# Patient Record
Sex: Male | Born: 1940 | Race: Asian | Hispanic: No | Marital: Married | Smoking: Former smoker
Health system: Southern US, Community
[De-identification: ages and names within clinical notes are randomized; demographics above are authoritative.]

## PROBLEM LIST (undated history)

## (undated) DIAGNOSIS — N400 Enlarged prostate without lower urinary tract symptoms: Secondary | ICD-10-CM

## (undated) DIAGNOSIS — J189 Pneumonia, unspecified organism: Secondary | ICD-10-CM

## (undated) DIAGNOSIS — I714 Abdominal aortic aneurysm, without rupture, unspecified: Secondary | ICD-10-CM

## (undated) DIAGNOSIS — R911 Solitary pulmonary nodule: Principal | ICD-10-CM

## (undated) DIAGNOSIS — R351 Nocturia: Secondary | ICD-10-CM

## (undated) DIAGNOSIS — M069 Rheumatoid arthritis, unspecified: Secondary | ICD-10-CM

## (undated) DIAGNOSIS — M2550 Pain in unspecified joint: Secondary | ICD-10-CM

## (undated) DIAGNOSIS — R350 Frequency of micturition: Secondary | ICD-10-CM

## (undated) DIAGNOSIS — R3915 Urgency of urination: Secondary | ICD-10-CM

## (undated) DIAGNOSIS — G4700 Insomnia, unspecified: Secondary | ICD-10-CM

## (undated) DIAGNOSIS — I10 Essential (primary) hypertension: Secondary | ICD-10-CM

## (undated) DIAGNOSIS — M2540 Effusion, unspecified joint: Secondary | ICD-10-CM

## (undated) HISTORY — DX: Essential (primary) hypertension: I10

## (undated) HISTORY — PX: APPENDECTOMY: SHX54

## (undated) HISTORY — DX: Rheumatoid arthritis, unspecified: M06.9

## (undated) HISTORY — DX: Solitary pulmonary nodule: R91.1

---

## 1999-12-16 ENCOUNTER — Encounter: Admission: RE | Admit: 1999-12-16 | Discharge: 1999-12-16 | Payer: Self-pay | Admitting: *Deleted

## 1999-12-16 ENCOUNTER — Encounter: Payer: Self-pay | Admitting: *Deleted

## 1999-12-24 ENCOUNTER — Ambulatory Visit (HOSPITAL_COMMUNITY): Admission: RE | Admit: 1999-12-24 | Discharge: 1999-12-24 | Payer: Self-pay | Admitting: *Deleted

## 1999-12-24 ENCOUNTER — Encounter (INDEPENDENT_AMBULATORY_CARE_PROVIDER_SITE_OTHER): Payer: Self-pay | Admitting: *Deleted

## 2002-05-17 ENCOUNTER — Encounter: Admission: RE | Admit: 2002-05-17 | Discharge: 2002-08-15 | Payer: Self-pay | Admitting: Rheumatology

## 2002-10-05 ENCOUNTER — Encounter: Payer: Self-pay | Admitting: Rheumatology

## 2002-10-05 ENCOUNTER — Encounter: Admission: RE | Admit: 2002-10-05 | Discharge: 2002-10-05 | Payer: Self-pay | Admitting: Rheumatology

## 2003-11-01 ENCOUNTER — Encounter: Admission: RE | Admit: 2003-11-01 | Discharge: 2003-11-01 | Payer: Self-pay | Admitting: Rheumatology

## 2005-02-04 ENCOUNTER — Ambulatory Visit: Payer: Self-pay | Admitting: Internal Medicine

## 2005-03-02 ENCOUNTER — Ambulatory Visit: Payer: Self-pay | Admitting: Internal Medicine

## 2005-11-25 ENCOUNTER — Ambulatory Visit: Payer: Self-pay | Admitting: Internal Medicine

## 2006-02-28 HISTORY — PX: INGUINAL HERNIA REPAIR: SHX194

## 2006-12-30 ENCOUNTER — Encounter: Payer: Self-pay | Admitting: *Deleted

## 2006-12-30 DIAGNOSIS — Z9889 Other specified postprocedural states: Secondary | ICD-10-CM | POA: Insufficient documentation

## 2006-12-30 DIAGNOSIS — M949 Disorder of cartilage, unspecified: Secondary | ICD-10-CM

## 2006-12-30 DIAGNOSIS — Z9089 Acquired absence of other organs: Secondary | ICD-10-CM

## 2006-12-30 DIAGNOSIS — M069 Rheumatoid arthritis, unspecified: Secondary | ICD-10-CM

## 2006-12-30 DIAGNOSIS — M899 Disorder of bone, unspecified: Secondary | ICD-10-CM | POA: Insufficient documentation

## 2007-01-22 ENCOUNTER — Encounter: Payer: Self-pay | Admitting: Internal Medicine

## 2007-05-01 ENCOUNTER — Encounter: Payer: Self-pay | Admitting: Internal Medicine

## 2007-05-03 ENCOUNTER — Ambulatory Visit: Payer: Self-pay | Admitting: Internal Medicine

## 2007-05-03 ENCOUNTER — Encounter: Payer: Self-pay | Admitting: Internal Medicine

## 2007-05-03 DIAGNOSIS — F17200 Nicotine dependence, unspecified, uncomplicated: Secondary | ICD-10-CM

## 2007-05-03 DIAGNOSIS — J189 Pneumonia, unspecified organism: Secondary | ICD-10-CM

## 2007-05-03 LAB — CONVERTED CEMR LAB
ALT: 20 units/L (ref 0–53)
AST: 22 units/L (ref 0–37)
Albumin: 4.3 g/dL (ref 3.5–5.2)
Alkaline Phosphatase: 54 units/L (ref 39–117)
BUN: 13 mg/dL (ref 6–23)
Basophils Absolute: 0.1 10*3/uL (ref 0.0–0.1)
Basophils Relative: 0.7 % (ref 0.0–1.0)
Bilirubin, Direct: 0.1 mg/dL (ref 0.0–0.3)
CO2: 32 meq/L (ref 19–32)
Calcium: 9.9 mg/dL (ref 8.4–10.5)
Chloride: 100 meq/L (ref 96–112)
Cholesterol: 188 mg/dL (ref 0–200)
Creatinine, Ser: 0.6 mg/dL (ref 0.4–1.5)
Direct LDL: 114.3 mg/dL
Eosinophils Absolute: 0.2 10*3/uL (ref 0.0–0.6)
Eosinophils Relative: 1.8 % (ref 0.0–5.0)
GFR calc Af Amer: 174 mL/min
GFR calc non Af Amer: 144 mL/min
Glucose, Bld: 95 mg/dL (ref 70–99)
HCT: 41.6 % (ref 39.0–52.0)
HDL: 30.2 mg/dL — ABNORMAL LOW (ref >39.0)
Hemoglobin: 14.2 g/dL (ref 13.0–17.0)
Lymphocytes Relative: 19.7 % (ref 12.0–46.0)
MCHC: 34.1 g/dL (ref 30.0–36.0)
MCV: 99 fL (ref 78.0–100.0)
Monocytes Absolute: 0.7 10*3/uL (ref 0.2–0.7)
Monocytes Relative: 8.3 % (ref 3.0–11.0)
Neutro Abs: 6.1 10*3/uL (ref 1.4–7.7)
Neutrophils Relative %: 69.5 % (ref 43.0–77.0)
PSA: 0.95 ng/mL (ref 0.10–4.00)
Platelets: 214 10*3/uL (ref 150–400)
Potassium: 4.3 meq/L (ref 3.5–5.1)
RBC: 4.2 M/uL — ABNORMAL LOW (ref 4.22–5.81)
RDW: 13 % (ref 11.5–14.6)
Sodium: 138 meq/L (ref 135–145)
TSH: 1.34 microintl units/mL (ref 0.35–5.50)
Total Bilirubin: 0.8 mg/dL (ref 0.3–1.2)
Total CHOL/HDL Ratio: 6.2
Total Protein: 6.8 g/dL (ref 6.0–8.3)
Triglycerides: 254 mg/dL (ref 0–149)
VLDL: 51 mg/dL — ABNORMAL HIGH (ref 0–40)
WBC: 8.9 10*3/uL (ref 4.5–10.5)

## 2007-05-06 ENCOUNTER — Encounter: Payer: Self-pay | Admitting: Internal Medicine

## 2007-08-09 ENCOUNTER — Encounter: Payer: Self-pay | Admitting: Internal Medicine

## 2007-11-08 ENCOUNTER — Encounter: Payer: Self-pay | Admitting: Internal Medicine

## 2008-01-03 ENCOUNTER — Telehealth: Payer: Self-pay | Admitting: Internal Medicine

## 2008-02-18 ENCOUNTER — Encounter: Payer: Self-pay | Admitting: Internal Medicine

## 2008-05-13 ENCOUNTER — Encounter: Payer: Self-pay | Admitting: Internal Medicine

## 2008-07-17 ENCOUNTER — Telehealth: Payer: Self-pay | Admitting: Internal Medicine

## 2008-07-31 ENCOUNTER — Ambulatory Visit: Payer: Self-pay | Admitting: Internal Medicine

## 2008-07-31 DIAGNOSIS — M5430 Sciatica, unspecified side: Secondary | ICD-10-CM | POA: Insufficient documentation

## 2008-07-31 LAB — CONVERTED CEMR LAB
BUN: 13 mg/dL (ref 6–23)
CO2: 29 meq/L (ref 19–32)
Calcium: 9.3 mg/dL (ref 8.4–10.5)
Chloride: 106 meq/L (ref 96–112)
Cholesterol: 180 mg/dL (ref 0–200)
Creatinine, Ser: 0.9 mg/dL (ref 0.4–1.5)
GFR calc non Af Amer: 89.74 mL/min (ref >60)
Glucose, Bld: 87 mg/dL (ref 70–99)
HDL: 34 mg/dL — ABNORMAL LOW (ref >39.00)
LDL Cholesterol: 118 mg/dL — ABNORMAL HIGH (ref 0–99)
PSA: 0.72 ng/mL (ref 0.10–4.00)
Potassium: 4 meq/L (ref 3.5–5.1)
Sodium: 141 meq/L (ref 135–145)
Total CHOL/HDL Ratio: 5
Triglycerides: 139 mg/dL (ref 0.0–149.0)
VLDL: 27.8 mg/dL (ref 0.0–40.0)

## 2008-11-11 ENCOUNTER — Encounter: Payer: Self-pay | Admitting: Internal Medicine

## 2009-01-20 ENCOUNTER — Encounter: Payer: Self-pay | Admitting: Internal Medicine

## 2009-02-11 ENCOUNTER — Ambulatory Visit: Payer: Self-pay | Admitting: Internal Medicine

## 2009-02-11 DIAGNOSIS — R7989 Other specified abnormal findings of blood chemistry: Secondary | ICD-10-CM | POA: Insufficient documentation

## 2009-02-12 ENCOUNTER — Encounter: Payer: Self-pay | Admitting: Internal Medicine

## 2009-02-13 ENCOUNTER — Telehealth (INDEPENDENT_AMBULATORY_CARE_PROVIDER_SITE_OTHER): Payer: Self-pay | Admitting: *Deleted

## 2009-02-19 ENCOUNTER — Ambulatory Visit: Payer: Self-pay | Admitting: Internal Medicine

## 2009-04-01 ENCOUNTER — Encounter: Payer: Self-pay | Admitting: Internal Medicine

## 2009-06-29 ENCOUNTER — Encounter: Payer: Self-pay | Admitting: Internal Medicine

## 2009-09-28 ENCOUNTER — Encounter: Payer: Self-pay | Admitting: Internal Medicine

## 2009-09-30 ENCOUNTER — Telehealth: Payer: Self-pay | Admitting: Internal Medicine

## 2009-10-05 ENCOUNTER — Telehealth: Payer: Self-pay | Admitting: Internal Medicine

## 2009-10-06 ENCOUNTER — Ambulatory Visit: Payer: Self-pay | Admitting: Cardiology

## 2009-10-10 ENCOUNTER — Telehealth: Payer: Self-pay | Admitting: Internal Medicine

## 2009-10-22 ENCOUNTER — Encounter: Payer: Self-pay | Admitting: Internal Medicine

## 2009-10-22 ENCOUNTER — Telehealth: Payer: Self-pay | Admitting: Internal Medicine

## 2009-12-30 ENCOUNTER — Encounter: Payer: Self-pay | Admitting: Internal Medicine

## 2010-02-04 ENCOUNTER — Ambulatory Visit: Payer: Self-pay | Admitting: Internal Medicine

## 2010-02-09 ENCOUNTER — Ambulatory Visit: Payer: Self-pay | Admitting: Internal Medicine

## 2010-02-09 ENCOUNTER — Encounter: Payer: Self-pay | Admitting: Internal Medicine

## 2010-02-09 DIAGNOSIS — N529 Male erectile dysfunction, unspecified: Secondary | ICD-10-CM

## 2010-02-09 DIAGNOSIS — N400 Enlarged prostate without lower urinary tract symptoms: Secondary | ICD-10-CM

## 2010-02-09 LAB — CONVERTED CEMR LAB
CO2: 28 meq/L (ref 19–32)
Calcium: 9.4 mg/dL (ref 8.4–10.5)
Chloride: 103 meq/L (ref 96–112)
Sodium: 139 meq/L (ref 135–145)
Testosterone: 309.04 ng/dL — ABNORMAL LOW (ref 350.00–890.00)

## 2010-02-12 ENCOUNTER — Telehealth: Payer: Self-pay | Admitting: Internal Medicine

## 2010-02-15 ENCOUNTER — Telehealth: Payer: Self-pay | Admitting: Internal Medicine

## 2010-02-16 ENCOUNTER — Ambulatory Visit: Payer: Self-pay | Admitting: Internal Medicine

## 2010-02-28 DIAGNOSIS — R911 Solitary pulmonary nodule: Secondary | ICD-10-CM

## 2010-02-28 HISTORY — DX: Solitary pulmonary nodule: R91.1

## 2010-03-28 LAB — CONVERTED CEMR LAB
Hep B C IgM: NEGATIVE
Hep B Core Total Ab: POSITIVE — AB
Hep B S Ab: NEGATIVE
Hepatitis B Surface Ag: NEGATIVE

## 2010-03-30 NOTE — Letter (Signed)
Summary: [facility]   Imported By: Sherian Rein 01/05/2010 10:57:30  _____________________________________________________________________  External Attachment:    Type:   Image     Comment:   External Document

## 2010-03-30 NOTE — Letter (Signed)
Summary: [facility]   Imported By: Sherian Rein 07/08/2009 08:54:40  _____________________________________________________________________  External Attachment:    Type:   Image     Comment:   External Document

## 2010-03-30 NOTE — Letter (Signed)
Summary: [facility] [place]   Imported By: Lester [place] 10/05/2009 07:26:53  _____________________________________________________________________  External Attachment:    Type:   Image     Comment:   External Document

## 2010-03-30 NOTE — Letter (Signed)
Summary: [facility]  [facility]   Imported By: Lennie Odor 10/02/2009 17:15:54  _____________________________________________________________________  External Attachment:    Type:   Image     Comment:   External Document

## 2010-03-30 NOTE — Letter (Signed)
Summary: [facility]   Imported By: Sherian Rein 04/06/2009 10:13:23  _____________________________________________________________________  External Attachment:    Type:   Image     Comment:   External Document

## 2010-03-30 NOTE — Letter (Signed)
   [facility] [address] [place], [place]  [address] Phone: [phone]      October 22, 2009   [facility] [address] [place], [place] [address]  RE:  LAB RESULTS  Dear  Mr. Burridge,  The following is an interpretation of your most recent lab tests.  Please take note of any instructions provided or changes to medications that have resulted from your lab work.    CT angiogram of the chest reveals that there is a crooked ascending aorta but not an aneurysm.   Sincerely Yours,    Jacques Navy MD  T ANGIO CHEST W/CM &/OR WO/CM - [id number]   Clinical Data:  Weight loss, ascending aortic aneurysm   CT ANGIOGRAPHY CHEST WITH CONTRAST   Technique:  Multidetector CT imaging of the chest was performed using the standard protocol during bolus administration of intravenous contrast.  Multiplanar CT image reconstructions including MIPs were obtained to evaluate the vascular anatomy.   Contrast:  100 ml Omnipaque-300 IV   Comparison:  None.   Findings:  Aortic root 4 cm maximal transverse diameter, distal ascending aorta 4 cm, proximal arch 3.6 cm, tapering to a diameter of 2.7 cm distally.  Proximal descending thoracic aorta is mildly ectatic measuring 3.7 cm, tapering to a diameter of 2.6 cm the diaphragm.   Patchy atheromatous calcifications are noted in coronary arteries and throughout the thoracic aorta.  No evidence of dissection or stenosis.  There is classic brachiocephalic arterial origin anatomy without proximal stenosis.  Visualized portions of the suprarenal abdominal aorta are atheromatous but otherwise unremarkable.   Pulmonary arterial circulation is unremarkable.   No pleural or pericardial effusion.  There are a few sub centimeter prevascular and AP window lymph nodes.  No hilar adenopathy. Visualized upper abdomen unremarkable.   There is linear scarring or atelectasis in the posterior basal segment left lower lobe.  Minimal  dependent atelectasis in the posterior right lower lobe.  Lungs otherwise clear.  There are mild emphysematous changes in the upper lobes.  Minimal spondylitic changes in the thoracic spine.   Review of the MIP images confirms the above findings.   IMPRESSION:   1.  4 cm ectatic ascending aorta.  Follow up recommended. 2.  Coronary and aortic calcifications. 3.  Mild emphysematous changes in the upper lobes.   Read By:  Deanne Coffer, D. Reuel Boom,  M.D.

## 2010-03-30 NOTE — Progress Notes (Signed)
Summary: PEER TO PEER - CT CHEST  ---- Converted from flag ---- ---- 10/05/2009 9:58 AM, Shelbie Proctor wrote: need peer to peer from doctor  pls call [phone] opt 2   referral for  CT chest with IV contrast to r/o ascending aortic aneurysm.  CXR ([facility]) with mild prominence of ascending aorta - receommended CT to r/o aneuryxm. Last BUN/Cr 14/0.67 September 28, 2009 case close end of the day   PT Plan [employer] member # [phone] ------------------------------  Phone Note From Other Clinic   Summary of Call: Today is the last day for the PEER TO PEER review, case will be closed tomorrow. Would you like [place] to get insurance on the phone for you?  Initial call taken by: Lamar Sprinkles, CMA,  October 05, 2009 10:41 AM  Follow-up for Phone Call        called and got authorization Follow-up by: Jacques Navy MD,  October 05, 2009 1:29 PM

## 2010-03-30 NOTE — Progress Notes (Signed)
Summary: REQ FOR RESULTS  Phone Note From Other Clinic   Summary of Call: Pt's daughter has called CT multiple times. Patient is requesting results of CT in a letter to be mailed to patients home.  Initial call taken by: Lamar Sprinkles, CMA,  October 22, 2009 2:42 PM  Follow-up for Phone Call        letter done and ready to mail in 3 minutes Follow-up by: Jacques Navy MD,  October 22, 2009 4:46 PM  Additional Follow-up for Phone Call Additional follow up Details #1::        letter was mailed to pt Additional Follow-up by: Ami Bullins CMA,  October 23, 2009 8:45 AM

## 2010-03-30 NOTE — Progress Notes (Signed)
Summary: REFILL - TEMAZEPAM  Phone Note Refill Request Message from:  Pharmacy  Refills Requested: Medication #1:  TEMAZEPAM 15 MG CAPS 1 at bedtime as needed Initial call taken by: Lamar Sprinkles, CMA,  October 10, 2009 9:25 AM  Follow-up for Phone Call        OK for refill x 5 Follow-up by: Jacques Navy MD,  October 10, 2009 3:04 PM    Prescriptions: TEMAZEPAM 15 MG CAPS (TEMAZEPAM) 1 at bedtime as needed  #30 x 5   Entered and Authorized by:   Ami Bullins CMA   Signed by:   Ami Bullins CMA on 10/12/2009   Method used:   Telephoned to ...       [facility] (retail)       [address] [place]       [address]       [place], [place]  [address]       Ph: [phone]       Fax: [phone]   RxID:   [id number]

## 2010-03-30 NOTE — Progress Notes (Signed)
  Phone Note From Other Clinic   Summary of Call: Patient's rheumatologist called and cxr showed possible aortic aneurysm. They are faxing over cxr report and office note today.  Initial call taken by: Lamar Sprinkles, CMA,  September 30, 2009 3:42 PM  Follow-up for Phone Call        read report: mild prominence of ascending aorta. Called patient to explain. Will schedule ct chest with contrast for further evaluation. [facility] notified Follow-up by: Jacques Navy MD,  September 30, 2009 5:24 PM  New Problems: ASCENDING AORTIC ANEURYSM (ICD-441.2)   New Problems: ASCENDING AORTIC ANEURYSM (ICD-441.2)

## 2010-04-01 ENCOUNTER — Encounter: Payer: Self-pay | Admitting: Internal Medicine

## 2010-04-01 NOTE — Progress Notes (Signed)
  Phone Note Outgoing Call   Reason for Call: Discuss lab or test results Summary of Call: please call patient - path report negative- no malignancy. Lesion is hyperpigmentation without sign of dysplasia or melanocyte displasia. Thanks Initial call taken by: Jacques Navy MD,  February 15, 2010 9:01 AM  Follow-up for Phone Call        informed pt and his wife Follow-up by: Ami Bullins CMA,  February 15, 2010 9:20 AM

## 2010-04-01 NOTE — Assessment & Plan Note (Signed)
Summary: last ov 12/10---cpx/cd   Vital Signs:  Patient profile:   70 year old male Height:      68 inches Weight:      143 pounds BMI:     21.82 O2 Sat:      97 % on Room air Temp:     98.0 degrees F oral Pulse rate:   70 / minute BP sitting:   138 / 88  (left arm) Cuff size:   regular  Vitals Entered By: Bill Salinas CMA (February 09, 2010 10:43 AM)  O2 Flow:  Room air CC: cpx/ ab  Vision Screening:      Vision Comments: Dec 2010 normal eye exam   Primary Care Provider:  Norins  CC:  cpx/ ab.  History of Present Illness: Patient presents for medical follow-up.   He is followed by Dr. Nickola Major for rheumatoid arthritis and closely monitors his labs related to his treatment. He has generally been feeling well except for fatigue.   He is concerned about prostate cancer since two colleagues have been recently diagnosed. His chart is reviewed and he had a normal PSA in '09 = 0.72 and has no symptoms except for nocturia. He had been on tamsulosin with good results but he believes that Dr. Nickola Major stopped this medication.   He has a new skin lesion on the back of this left leg which is dark in color.   He is independent in ADLs, continues to teach at [employer], has had no falls and is emotionally stable.   Preventive Screening-Counseling & Management  Alcohol-Tobacco     Alcohol drinks/day: 0     Smoking Status: quit     Year Quit: 2010  Caffeine-Diet-Exercise     Caffeine use/day: 4 to 5 cups per day     Diet Comments: healthy     Diet Counseling: not indicated; diet is assessed to be healthy     Does Patient Exercise: no  Hep-HIV-STD-Contraception     Hepatitis Risk: no risk noted     HIV Risk: no risk noted     STD Risk: no risk noted     Dental Visit-last 6 months no     TSE monthly: no     Sun Exposure-Excessive: no  Safety-Violence-Falls     Seat Belt Use: yes     Helmet Use: n/a     Firearms in the Home: no firearms in the home     Smoke Detectors: yes  Violence in the Home: no risk noted     Sexual Abuse: no     Fall Risk: slight fall risk      Sexual History:  currently monogamous.        Drug Use:  never.        Blood Transfusions:  no.    Current Medications (verified): 1)  Multivitamins   Tabs (Multiple Vitamin) .... Take One Tablet Once Daily 2)  Methotrexate 2.5 Mg  Tabs (Methotrexate Sodium) .... Take 6 Tablets Weekly 3)  Hydroxychloroquine Sulfate 200 Mg  Tabs (Hydroxychloroquine Sulfate) .... Take One Tablet Twice Daily 4)  Folic Acid 1 Mg  Tabs (Folic Acid) .... Take One Tablet Once Daily 5)  Temazepam 15 Mg Caps (Temazepam) .Marland Kitchen.. 1 At Bedtime As Needed 6)  Flomax 0.4 Mg Xr24h-Cap (Tamsulosin Hcl) .Marland Kitchen.. 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 '09 ROUTINE GENERAL MEDICAL  EXAM@HEALTH  CARE FACL (ICD-V70.0) TOBACCO ABUSE (ICD-305.1) PNEUMONIA, ORGANISM UNSPECIFIED (ICD-486) OSTEOPENIA (ICD-733.90) ARTHRITIS, RHEUMATOID (ICD-714.0)      Past Surgical History: Last updated: 12/30/2006 INGUINAL HERNIORRHAPHY, LEFT, HX OF (ICD-V45.89) HEMORRHOIDECTOMY, HX OF (ICD-V45.89) APPENDECTOMY, HX OF (ICD-V45.79)  Family History: Last updated: 27-Aug-2008 father - deceased @70 : CVA, HTN, mother- deceased @85 : DM, HTN, CAD/MI Neg- prostate or colon cancer;   Social History: Last updated: Aug 27, 2008 PhD - [employer] Married - '70 2 sons - '70, '74;  work- professor at [employer] marriage in good heatlh  Social History: Caffeine use/day:  4 to 5 cups per day Dental Care w/in 6 mos.:  no Sun Exposure-Excessive:  no Risk analyst Use:  yes Fall Risk:  slight fall risk Hepatitis Risk:  no risk noted HIV Risk:  no risk noted STD Risk:  no risk noted Sexual History:  currently monogamous Drug Use:  never Blood Transfusions:  no  Review of Systems  The patient denies  anorexia, fever, weight loss, weight gain, decreased hearing, chest pain, syncope, dyspnea on exertion, prolonged cough, abdominal pain, severe indigestion/heartburn, incontinence, muscle weakness, difficulty walking, depression, abnormal bleeding, and enlarged lymph nodes.         nocturia x 3  Physical Exam  General:  Thin but well nourished chinese gentleman in no distress Head:  normocephalic and atraumatic.   Eyes:  vision grossly intact, pupils equal, pupils round, corneas and lenses clear, and no injection.   Mouth:  no buccal or posterior pharyngeal lesions Neck:  supple, full ROM, no thyromegaly, and no carotid bruits.   Chest Wall:  no deformities.   Lungs:  normal respiratory effort, normal breath sounds, no crackles, and no wheezes.   Heart:  normal rate, regular rhythm, no murmur, and no JVD.   Abdomen:  soft, non-tender, and no hepatomegaly.   Msk:  normal ROM, no joint warmth, and no redness over joints.   Pulses:  2+ radial Extremities:  No clubbing, cyanosis, edema, or deformity noted with normal full range of motion of all joints.   Neurologic:  alert & oriented X3, cranial nerves II-XII intact, and gait normal.   Skin:  1.5 x 3 cm macular dark lesion with an irregular border at the popliteal fossa left leg. No other suspicious lesions.  Cervical Nodes:  no anterior cervical adenopathy and no posterior cervical adenopathy.   Psych:  Oriented X3, memory intact for recent and remote, normally interactive, and good eye contact.     Impression & Recommendations:  Problem # 1:  ERECTILE DYSFUNCTION, ORGANIC (ICD-607.84) Patient does report decrease tumescene and erectile function  Plan - r/o organic impotence/hypogonadism  Orders: TLB-Testosterone, Total (84403-TESTO)  addendum - Tests: (2) Testosterone, Total (TESTO)   Testosterone         [L]  309.04 ng/dL                017.51-025.85  Plan - patient will return to discuss treatment options  Problem # 2:  TOBACCO  ABUSE (ICD-305.1) Patient quite smoking 6 months ago!! Given positive reenforcement. Chest x-ray Aug '11 with findings suggestive of emphysema. CT  angip chest - suggestive of emphysema but no aneurysm.   Problem # 3:  ARTHRITIS, RHEUMATOID (ICD-714.0) stable and followed closely by Dr. Nickola Major  Problem # 4:  OSTEOPENIA (ICD-733.90) Last DXA Nov '10 - osteopenia spine and hips: T-score -2.0 spine; r and L hips -1.5 and -1.7 reespectively  Plan - no indeication for medications  calcium 1200 mg daily (diet and supplement) and vitamin D 1000 international units daily.  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  Plan - resume tamulosin 0.4 mg daily  His updated medication list for this problem includes:    Flomax 0.4 Mg Xr24h-cap (Tamsulosin hcl) .Marland Kitchen... 1 by mouth at bedtime for nocturia  Problem # 6:  NEOPLASM, SKIN, UNCERTAIN BEHAVIOR (ICD-238.2)  lesion on leg which is dark, irregular and changing may be melanoma  Plan - punch biopsy with specimen to pathology.  Orders: Biopsy (Punch) Skin, each additional Lesion (11101)  Problem # 7:  Preventive Health Care (ICD-V70.0) Generally doing well. Discussed prostate cancer screening including USPHTF recommendations after which he deferred additional PSA testing. He does not have a colonoscopy in EMR. General lab results - normal range. Immunizations: Verdia Kuba. '10; Pneumonia vaccine Feb '08; Flu Oct '11.  In summary - a nice man with several chronic diseases which are stable and well controlled. He will return as needed.   Complete Medication List: 1)  Multivitamins Tabs (Multiple vitamin) .... Take one tablet once daily 2)  Methotrexate 2.5 Mg Tabs (Methotrexate sodium) .... Take 6 tablets weekly 3)  Hydroxychloroquine Sulfate 200 Mg Tabs (Hydroxychloroquine sulfate) .... Take one tablet twice  daily 4)  Folic Acid 1 Mg Tabs (Folic acid) .... Take one tablet once daily 5)  Temazepam 15 Mg Caps (Temazepam) .Marland Kitchen.. 1 at bedtime as needed 6)  Flomax 0.4 Mg Xr24h-cap (Tamsulosin hcl) .Marland Kitchen.. 1 by mouth at bedtime for nocturia  Other Orders: TLB-Lipid Panel (80061-LIPID) TLB-BMP (Basic Metabolic Panel-BMET) (80048-METABOL) EKG w/ Interpretation (93000)   Orders Added: 1)  TLB-Lipid Panel [80061-LIPID] 2)  TLB-Testosterone, Total [84403-TESTO] 3)  TLB-BMP (Basic Metabolic Panel-BMET) [80048-METABOL] 4)  Est. Patient 65& > [99397] 5)  Est. Patient Level IV [30865] 6)  Biopsy (Punch) Skin, each additional Lesion [11101] 7)  EKG w/ Interpretation [93000]   Immunization History:  Influenza Immunization History:    Influenza:  historical (12/22/2009)   Immunization History:  Influenza Immunization History:    Influenza:  Historical (12/22/2009)   Procedure Note Last Tetanus: Td (02/11/2009)  Biopsy: The patient complains of suspicious lesion and changing lesion. Indication: suspicious lesion  Procedure # 1: punch biopsy    Size (in cm): 1.5 x 3.0    Location: popliteal fossa left    Comment: informed consent obtained    Instrument used: 3mm punch    Anesthesia: 1% lidocaine w/epinephrine    Closure: simple interrupted    Superficial Suture: 3-0 Nylon       # of superficial sutures: 2  Cleaned and prepped with: betadine Wound dressing: bandaid Additional Instructions: routine wound care. Return for suture removal 1 week.  Patient: [name] [name] Note: All result statuses are Final unless otherwise noted.  Tests: (1) Lipid Panel (LIPID)   Cholesterol               183 mg/dL                   7-846     ATP III Classification            Desirable:  < 200 mg/dL                    Borderline High:  200 - 239 mg/dL  High:  > = 240 mg/dL   Triglycerides        [H]  283.0 mg/dL                 2.4-401.0     Normal:  <150 mg/dL     Borderline High:  272 - 199  mg/dL   HDL                  [L]  53.66 mg/dL                 >44.03   VLDL Cholesterol     [H]  56.6 mg/dL                  4.7-42.5  CHO/HDL Ratio:  CHD Risk                             6                    Men          Women     1/2 Average Risk     3.4          3.3     Average Risk          5.0          4.4     2X Average Risk          9.6          7.1     3X Average Risk          15.0          11.0                           Tests: (2) Testosterone, Total (TESTO)   Testosterone         [L]  309.04 ng/dL                956.38-756.43  Tests: (3) BMP (METABOL)   Sodium                    139 mEq/L                   135-145   Potassium                 4.3 mEq/L                   3.5-5.1   Chloride                  103 mEq/L                   96-112   Carbon Dioxide            28 mEq/L                    19-32   Glucose                   87 mg/dL                    32-95   BUN                       13 mg/dL  6-23   Creatinine                0.7 mg/dL                   0.4-5.4   Calcium                   9.4 mg/dL                   0.9-81.1   GFR                       111.97 mL/min               >60.00  Tests: (4) Cholesterol LDL - Direct (DIRLDL)  Cholesterol LDL - Direct                             106.6 mg/dL     Optimal:  <914 mg/dL     Near or Above Optimal:  100-129 mg/dL     Borderline High:  782-956 mg/dL     High:  213-086 mg/dL     Very High:  >578 mg/dL

## 2010-04-01 NOTE — Miscellaneous (Signed)
Summary: Bx of leg/[facility]  Bx of leg/ HealthCare   Imported By: Sherian Rein 02/12/2010 12:29:49  _____________________________________________________________________  External Attachment:    Type:   Image     Comment:   External Document

## 2010-04-01 NOTE — Assessment & Plan Note (Signed)
Summary: SUTURE REMOVAL/[facility]   Primary Care Provider:  Norins   History of Present Illness: punch biopsy poplieteal fossa - path report negative. Suture removed - mild dehesicence of wound.  Allergies: No Known Drug Allergies   Impression & Recommendations:  Problem # 1:  NEOPLASM, SKIN, UNCERTAIN BEHAVIOR (ICD-238.2)  negaive path report.  Patient given instructions to wash twice a day with soap and water and to cover with bandaide.   Orders: No Charge Patient Arrived (NCPA0) (NCPA0)  Complete Medication List: 1)  Multivitamins Tabs (Multiple vitamin) .... Take one tablet once daily 2)  Methotrexate 2.5 Mg Tabs (Methotrexate sodium) .... Take 6 tablets weekly 3)  Hydroxychloroquine Sulfate 200 Mg Tabs (Hydroxychloroquine sulfate) .... Take one tablet twice daily 4)  Folic Acid 1 Mg Tabs (Folic acid) .... Take one tablet once daily 5)  Temazepam 15 Mg Caps (Temazepam) .Marland Kitchen.. 1 at bedtime as needed 6)  Tamsulosin Hcl 0.4 Mg Caps (Tamsulosin hcl) .Marland Kitchen.. 1 by mouth at bedtime for urinary frequency  Patient Instructions: 1)  biopsy site - the wound is still open - not fully healed. Plan - wash with sopa and water, then dry and cover with a bandaid twice a day until healed.  Prescriptions: TAMSULOSIN HCL 0.4 MG CAPS (TAMSULOSIN HCL) 1 by mouth at bedtime for urinary frequency  #30 x 12   Entered and Authorized by:   Jacques Navy MD   Signed by:   Jacques Navy MD on 02/16/2010   Method used:   Electronically to        [facility] [address] (retail)       [phone]       [place], [place]  [address]       Ph: [phone]       Fax: [phone]   RxID:   [id number]    Orders Added: 1)  No Charge Patient Arrived (NCPA0) [NCPA0]

## 2010-04-01 NOTE — Progress Notes (Signed)
  Phone Note Refill Request Message from:  Fax from Pharmacy on February 12, 2010 2:57 PM  Refills Requested: Medication #1:  TEMAZEPAM 15 MG CAPS 1 at bedtime as needed please Advise refills  Initial call taken by: Ami Bullins CMA,  February 12, 2010 2:58 PM  Follow-up for Phone Call        ok for refill x 5 Follow-up by: Jacques Navy MD,  February 12, 2010 3:02 PM    Prescriptions: TEMAZEPAM 15 MG CAPS (TEMAZEPAM) 1 at bedtime as needed  #30 x 5   Entered by:   Ami Bullins CMA   Authorized by:   Jacques Navy MD   Signed by:   Bill Salinas CMA on 02/12/2010   Method used:   Telephoned to ...       [facility] [address] (retail)       [phone]       [place], [place]  [address]       Ph: [phone]       Fax: [phone]   RxID:   [id number]

## 2010-04-15 NOTE — Letter (Signed)
Summary: Zenovia Jordan MD/[facility] MD/[facility]   Imported By: Lester [place] 04/08/2010 09:32:59  _____________________________________________________________________  External Attachment:    Type:   Image     Comment:   External Document

## 2010-07-16 NOTE — Assessment & Plan Note (Signed)
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  This has been present in the past, but over  the past several months has gotten significantly larger and uncomfortable.   The patient also reports he has had problems with early satiety and mild  abdominal discomfort with decreased p.o. intake, and some mild weight loss.   Past medical history, family history, and social history are well-documented  in my note of September 30, 2003.   The patient is followed on a regular basis by Dr. Lennox [name] for his  rheumatoid arthritis.   CURRENT MEDICATIONS:  1. Methotrexate weekly as directed.  2. Hydrochloroquine b.i.d.  3. Folic acid.  4. Prednisone 5 mg 1/2 tablet daily.   REVIEW OF SYSTEMS:  The patient has had no fevers, sweats, or chills.  He  has had no ENT, cardiovascular, respiratory complaints.   LIMITED EXAM:  Temperature 97.1, blood pressure 117/76, pulse 81, weight  137.  GENERAL:  This is a pleasant slender gentleman in no acute distress.  ABDOMEN:  The patient had positive bowel sounds.  He had a scaphoid abdomen.  He had tenderness in the left upper quadrant.  There was no organ- or  splenomegaly, specifically no hepatomegaly.  The patient did have a peach-  sized hernia in the left groin that had positive bowel sounds that was  reducible with effort.  It was tender to palpation.   ASSESSMENT:  1. Rheumatoid arthritis.  Stable and directed per Dr. Lennox [name].  2. Hernia.  Patient with an enlarging hernia that is becoming difficult to      reduce and tender.  Plan:  Referral to [facility].  Phone      calls placed and the office will contact him with  appointment.  3. Gastrointestinal.  Patient with dyspepsia.  I am concerned about either      gastritis or early ulcer given his early satiety and decreased p.o.      intake.  Plan:  The patient is started on a proton pump inhibitor based      on samples available.  The patient will need to notify me if he      does not see significant improvement over the next several days in      regards to his abdominal discomfort and decreased appetite, early      satiety.            ______________________________  [name] Gess Norins, MD      MEN/MedQ  DD:  11/25/2005  DT:  11/27/2005  Job #:  [id number]   cc:   [facility]

## 2010-07-16 NOTE — Letter (Signed)
November 25, 2005     [facility]  [phone] [address]  [address]  [place], [place] [address]   RE:  [name] [name], [name] [name]  MRN:  [id number]  /  DOB:  Jul 13, 1940   Dear Colleagues:   CCS account number [id number].   Thank you very much for seeing [name] [name], a very pleasant 70 year old [place]  gentleman, for left groin hernia.   The patient reports that he has had, for some time, a small hernia in the  area of the left groin.  Over the past several months this has become  noticeably larger and more uncomfortable.  On examining him in the office  today, November 25, 2005, he does have a peach-sized hernia that has bowel  sounds that are positive.  The hernia is reducible with effort, but it is  slightly tender.   [name] [name] is also followed for rheumatoid arthritis by [name] [name], who  sees him on a regular basis.   I have enclosed a copy of my initial baseline note documenting his past  medical history, family history, and social history.   CURRENT MEDICATIONS:  1. Actonel 35 mg weekly.  2. Methotrexate as directed.  3. Hydrochloroquine b.i.d.  4. Folic acid daily.  5. Prednisone 5 mg 1/2 tablet daily.   If I can provide any additional information that will assist you in the care  of [name] [name], please do not hesitate to contact me.  As always I appreciate  the help you provide me in the care of my patients and  look forward to hearing from you.  Should [name] [name] require inpatient  treatment we would be happy to follow along for medical care.   Sincerely,      Rosalyn Gess. Norins, MD     MEN/MedQ  DD:  11/25/2005  DT:  11/27/2005  Job #:  [id number]

## 2010-08-20 ENCOUNTER — Other Ambulatory Visit: Payer: Self-pay | Admitting: Internal Medicine

## 2010-08-23 NOTE — Telephone Encounter (Signed)
Temazepam refill request [last refill 02/12/10 #30x5]

## 2010-08-26 ENCOUNTER — Telehealth: Payer: Self-pay | Admitting: *Deleted

## 2010-08-26 NOTE — Telephone Encounter (Signed)
Refill request on temazepam 15 mg one capsule by mouth qd qhs prn. Last fill was 07/23/2010, Please Advise refills

## 2010-08-26 NOTE — Telephone Encounter (Signed)
Ok for refill x 5 

## 2010-08-27 MED ORDER — TEMAZEPAM 15 MG PO CAPS: 15.0000 mg | ORAL_CAPSULE | Freq: Every evening | ORAL | Status: DC | PRN

## 2010-08-27 NOTE — Telephone Encounter (Signed)
Sorry I missed this. OK for refills x 5

## 2010-09-02 NOTE — Telephone Encounter (Signed)
Spoke w/pharmacy to check status of Rx request phoned in for Temazepam [#30x5] on 08/27/10. They do have the order/denial as duplicate

## 2011-01-14 ENCOUNTER — Telehealth: Payer: Self-pay

## 2011-01-14 NOTE — Telephone Encounter (Signed)
Patient called lmovm (unable to interrupret), sounds like pt is requesting CT results. Per system , last T shows 2011

## 2011-02-28 ENCOUNTER — Telehealth: Payer: Self-pay | Admitting: *Deleted

## 2011-02-28 MED ORDER — TEMAZEPAM 15 MG PO CAPS: 15.0000 mg | ORAL_CAPSULE | Freq: Every evening | ORAL | Status: DC | PRN

## 2011-02-28 NOTE — Telephone Encounter (Signed)
Refill request for temazepam SIG take one capsule by mouth everyday at bedtime prn qty of 30 last office visit was 02/16/2010, please advise refills

## 2011-02-28 NOTE — Telephone Encounter (Signed)
Ok for refill x 5 

## 2011-07-13 ENCOUNTER — Other Ambulatory Visit: Payer: Self-pay | Admitting: Internal Medicine

## 2011-07-13 DIAGNOSIS — R918 Other nonspecific abnormal finding of lung field: Secondary | ICD-10-CM

## 2011-07-18 ENCOUNTER — Ambulatory Visit
Admission: RE | Admit: 2011-07-18 | Discharge: 2011-07-18 | Disposition: A | Discharge status: Home / Self Care | Payer: BC Managed Care – PPO | Source: Ambulatory Visit | Attending: Internal Medicine | Admitting: Internal Medicine

## 2011-07-18 DIAGNOSIS — R918 Other nonspecific abnormal finding of lung field: Secondary | ICD-10-CM

## 2011-07-18 MED ORDER — IOHEXOL 300 MG/ML  SOLN
75.0000 mL | Freq: Once | INTRAMUSCULAR | Status: AC | PRN
  Administered 2011-07-18: 75 mL via INTRAVENOUS

## 2011-08-09 ENCOUNTER — Other Ambulatory Visit (HOSPITAL_COMMUNITY): Payer: Self-pay | Admitting: Internal Medicine

## 2011-08-09 DIAGNOSIS — R911 Solitary pulmonary nodule: Secondary | ICD-10-CM

## 2011-08-09 DIAGNOSIS — Z87891 Personal history of nicotine dependence: Secondary | ICD-10-CM

## 2011-08-17 ENCOUNTER — Ambulatory Visit (HOSPITAL_COMMUNITY)
Admission: RE | Admit: 2011-08-17 | Discharge: 2011-08-17 | Disposition: A | Discharge status: Home / Self Care | Payer: BC Managed Care – PPO | Source: Ambulatory Visit | Attending: Internal Medicine | Admitting: Internal Medicine

## 2011-08-17 DIAGNOSIS — I714 Abdominal aortic aneurysm, without rupture, unspecified: Secondary | ICD-10-CM | POA: Insufficient documentation

## 2011-08-17 DIAGNOSIS — R911 Solitary pulmonary nodule: Secondary | ICD-10-CM

## 2011-08-17 DIAGNOSIS — R918 Other nonspecific abnormal finding of lung field: Secondary | ICD-10-CM | POA: Insufficient documentation

## 2011-08-17 DIAGNOSIS — Z87891 Personal history of nicotine dependence: Secondary | ICD-10-CM | POA: Insufficient documentation

## 2011-08-17 LAB — GLUCOSE, CAPILLARY: Glucose-Capillary: 89 mg/dL (ref 70–99)

## 2011-08-17 MED ORDER — FLUDEOXYGLUCOSE F - 18 (FDG) INJECTION
16.3000 | Freq: Once | INTRAVENOUS | Status: AC | PRN
  Administered 2011-08-17: 16.3 via INTRAVENOUS

## 2011-08-22 ENCOUNTER — Encounter: Payer: Self-pay | Admitting: *Deleted

## 2011-08-22 NOTE — Progress Notes (Signed)
Spoke with wife regarding appt for [facility] 08/25/11 at 2:30.  She verbalized understanding of time and location

## 2011-08-25 ENCOUNTER — Encounter: Payer: Self-pay | Admitting: *Deleted

## 2011-08-25 ENCOUNTER — Institutional Professional Consult (permissible substitution) (INDEPENDENT_AMBULATORY_CARE_PROVIDER_SITE_OTHER): Payer: BC Managed Care – PPO | Admitting: Surgery

## 2011-08-25 VITALS — BP 109/68 | HR 85 | Resp 16 | Ht 67.0 in | Wt 140.0 lb

## 2011-08-25 DIAGNOSIS — I10 Essential (primary) hypertension: Secondary | ICD-10-CM | POA: Insufficient documentation

## 2011-08-25 DIAGNOSIS — R911 Solitary pulmonary nodule: Secondary | ICD-10-CM

## 2011-08-25 DIAGNOSIS — M810 Age-related osteoporosis without current pathological fracture: Secondary | ICD-10-CM | POA: Insufficient documentation

## 2011-08-25 NOTE — Progress Notes (Signed)
[address]            [place] [address]          [phone]       PCP is Juline Patch, MD Referring Provider is Juline Patch, MD  Chief Complaint  Patient presents with  . 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 He had a chest xray done recently as part of his health maintenance that showed a round density at the right lung base laterally.  CT scan 07/18/2011 showed a vague irregular nodular density in the right upper lobe that was grossly unchanged from prior CT of 10/06/2009. There was also a new ground-glass nodule in the apical left upper lobe measuring 9 x 15 mm. Additional scattered tiny pulmonary nodules were grossly stable from 10/06/2009. There was a calcified granuloma in the right lower lobe.There were a few AP window lymph nodes that were stable from the prior CT. There was a stable 4cm ascending aortic aneurysm.  He had a PET scan that showed that both upper lobe nodules had low level hypermetabolic activity. There was also hypermetabolic activity in the AP window lymph node region and within the right hilar region. There is no other abnormal hypermetabolic activity. There is also a 3.5 cm infrarenal abdominal aortic aneurysm.  Past Medical History  Diagnosis Date  . Hypertension   . Rheumatoid arthritis   . Osteoporosis   . Lung nodule 06/2011    History reviewed. No pertinent past surgical history.  Family History  Problem Relation Age of Onset  . Hypertension Father   . Stroke Father   . Hypertension Mother   . Diabetes Mother     Social History History  Substance Use Topics  . Smoking status: Former Smoker    Types: Cigarettes    Quit date: 03/26/2009  . Smokeless tobacco: Never Used   Comment: SMOKED 10 CIGS PER DAY  . Alcohol Use: No    Current Outpatient Prescriptions  Medication Sig  Dispense Refill  . benazepril (LOTENSIN) 10 MG tablet Take 10 mg by mouth daily.      . Calcium 600-200 MG-UNIT per tablet Take 1 tablet by mouth 2 (two) times daily.      . folic acid (FOLVITE) 1 MG tablet Take 1 mg by mouth daily.      . hydroxychloroquine (PLAQUENIL) 200 MG tablet Take 200 mg by mouth 2 (two) times daily.      . methotrexate 2.5 MG tablet Take by mouth 3 (three) times a week.      . Multiple Vitamin (MULTIVITAMIN) tablet Take 1 tablet by mouth daily.      Marland Kitchen POTASSIUM GLUCONATE PO Take 100 mg by mouth 1 day or 1 dose.      . fish oil-omega-3 fatty acids 1000 MG capsule Take 1 g by mouth daily.      . Misc Natural Products (OSTEO BI-FLEX JOINT SHIELD) TABS Take by mouth 1 day or 1 dose.      . temazepam (RESTORIL) 15 MG capsule Take 1 capsule (15 mg total) by mouth at bedtime as needed for sleep.  30 capsule  5  . temazepam (RESTORIL) 15 MG capsule Take 1 capsule (15 mg total) by mouth at bedtime as  needed for sleep.  30 capsule  5  . vitamin E (VITAMIN E) 400 UNIT capsule Take 400 Units by mouth daily.      Marland Kitchen DISCONTD: temazepam (RESTORIL) 15 MG capsule Take 15 mg by mouth at bedtime as needed.        No Known Allergies  Review of Systems  Constitutional: Negative for fever, chills, activity change, appetite change, fatigue and unexpected weight change.  HENT: Negative.   Eyes: Negative.   Respiratory: Negative.   Cardiovascular: Negative.   Gastrointestinal: Negative.   Genitourinary: Negative.   Musculoskeletal: Positive for arthralgias.  Neurological: Negative.   Hematological: Negative.   Psychiatric/Behavioral: Negative.     BP 109/68  Pulse 85  Resp 16  Ht 5\' 7"  (1.702 m)  Wt 140 lb (63.504 kg)  BMI 21.93 kg/m2  SpO2 94% Physical Exam  Constitutional: He is oriented to person, place, and time. He appears well-developed and well-nourished. No distress.  HENT:  Head: Normocephalic and atraumatic.  Mouth/Throat: Oropharynx is clear and moist.  Eyes:  Conjunctivae and EOM are normal. Pupils are equal, round, and reactive to light.  Neck: Neck supple. No JVD present. No tracheal deviation present. No thyromegaly present.  Cardiovascular: Normal rate, regular rhythm and intact distal pulses.  Exam reveals no gallop and no friction rub.   No murmur heard. Pulmonary/Chest: Effort normal and breath sounds normal. He has no wheezes. He has no rales. He exhibits no tenderness.  Abdominal: Soft. Bowel sounds are normal. He exhibits no distension and no mass. There is no tenderness.  Musculoskeletal: Normal range of motion. He exhibits no edema.  Lymphadenopathy:    He has no cervical adenopathy.  Neurological: He is alert and oriented to person, place, and time. He has normal strength. No cranial nerve deficit or sensory deficit.  Skin: Skin is warm and dry.  Psychiatric: He has a normal mood and affect.     Diagnostic Tests:  *RADIOLOGY REPORT*   Clinical Data: Initial treatment strategy for pulmonary nodule.   NUCLEAR MEDICINE PET SKULL BASE TO THIGH   Fasting Blood Glucose:  89   Technique:  16.3 mCi F-18 FDG was injected intravenously. CT data was obtained and used for attenuation correction and anatomic localization only.  (This was not acquired as a diagnostic CT examination.) Additional exam technical data entered on technologist worksheet.   Comparison:  None   Findings:   Neck: No hypermetabolic lymph nodes in the neck.   Chest:  Pulmonary nodule within the left upper lobe measures 1.4 cm.  The SUV max associated with this nodule is equal to 1.9.  Tiny nodule in the right upper lobe is identified measuring 9.8 mm. There is increased FDG uptake associated this nodule with an SUV max equal to 1.5.  AP window lymph node exhibits increased FDG uptake.  The SUV max is equal to 4.4.  There is also increased uptake within the right hilar region with an SUV max equal to 4.1.   Abdomen/Pelvis:  No abnormal hypermetabolic  activity within the liver, pancreas, adrenal glands, or spleen.  No hypermetabolic lymph nodes in the abdomen or pelvis. Infrarenal abdominal aortic aneurysm has an AP dimension equal to 3.5 cm, image 175.   [place]:  No focal hypermetabolic activity to suggest skeletal metastasis.   IMPRESSION:   1.  Small pulmonary nodules in both upper lobes exhibit malignant range FDG uptake and are worrisome for primary bronchogenic carcinoma is. 2.  Low level, nonspecific increased uptake is associated  with right hilar and AP window lymph nodes.  Metastatic adenopathy cannot be excluded. 3.  Abdominal aortic aneurysm.   Original Report Authenticated By: Rosealee Albee, M.D. *RADIOLOGY REPORT*   Clinical Data: Possible lung nodule on chest radiograph.   CT CHEST WITH CONTRAST   Technique:  Multidetector CT imaging of the chest was performed following the standard protocol during bolus administration of intravenous contrast.   Contrast: 75mL OMNIPAQUE IOHEXOL 300 MG/ML  SOLN   Comparison: Chest radiograph 07/12/2011 and CT chest 10/06/2009.   Findings: Mediastinal lymph nodes measure up to 11 mm in AP window, stable.  No hilar or axillary adenopathy.  Ascending aorta measures 4 cm.  Coronary artery calcification.  Heart size normal.  No pericardial effusion.   Emphysema.  There is a vague area of irregular nodular density in the apical segment right upper lobe (image 12), grossly unchanged from 10/06/2009.  Calcified granuloma in the right lower lobe. Scattered linear scarring in the lower lobes.   A ground-glass nodule in the apical left upper lobe measures 9 x 15 mm and is new from 10/06/2009.  Additional scattered tiny pulmonary nodules are grossly stable from 10/06/2009.  No pleural fluid. Airway is unremarkable.   Incidental imaging of the upper abdomen shows no acute findings. No worrisome lytic or sclerotic lesions.  Degenerative changes are seen in the spine. There are  old bilateral rib fractures.   IMPRESSION:   1.  Left upper lobe ground-glass nodule is new from 10/06/2009 and worrisome for primary bronchogenic carcinoma. Initial follow-up by chest CT without contrast is recommended in 3 months to confirm persistence.   This recommendation follows the consensus statement: Recommendations for the Management of Subsolid Pulmonary Nodules Detected at CT:  A Statement from the Fleischner Society as published in Radiology 2013; 266:304-317. These results will be called to the ordering clinician or representative by the Radiologist Assistant, and communication documented in the PACS Dashboard. 2.  A healed right rib fracture accounts for the questioned abnormality on recent chest radiograph. 3.  Irregular right upper lobe nodular density and scattered tiny pulmonary nodules are grossly stable.  Continued attention on follow-up exams is warranted. 4.  Ascending aortic aneurysm and coronary artery calcification.   Original Report Authenticated By: Reyes Ivan, M.D.   Impression:  This gentleman has a complicated problem with small bilateral upper lobe pulmonary nodules that have low-level hypermetabolic activity on PET scan as well as hypermetabolic lymphadenopathy in the AP window and right hilar regions. The lymph nodes did not appear significantly enlarged. The right upper lobe nodule has been present for almost 2 years and is grossly unchanged, making this more likely to be a benign lesion, although it does have low-level hypermetabolic activity. The left upper lobe lesion is new but it is a small, ill-defined ground-glass lesion with low-level hypermetabolic activity. 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 The bilateral hypermetabolic lymph nodes make this even more difficult to interpret. This could range from a completely benign process to Stage 4 lung cancer. The lung lesions are small and ill-defined and I think  CT-guided or ENB biopsy would have a low yield even if these were cancers. Even if we got a negative biopsy we would have to remain suspicious and continue close followup. The AP window and right hilar lymph nodes are also small and the only way to biopsy them would be with endobronchial ultrasound which would have a low yield given their size.  Therefore I think it would be best to follow this patient for a brief interval and repeat his PET scan in 4 months to see if any of these lesions have increased in size. If there is any increase in size of the lung lesions or lymph nodes then we would need to be aggressive in doing biopsies to prove that these are malignant. The patient was discussed at thoracic oncology conference and the consensus of the oncologist and surgeons present was that we should continue to follow the patient for a few months and repeat the PET scan. I reviewed the scans with the patient and his wife and answered all their questions. They're in agreement with repeating his PET scan in 4 months. I will see him back at that time.  Plan:  Repeat PET scan in 4 months and return to see me at that time.

## 2011-08-25 NOTE — Progress Notes (Signed)
Spoke with pt and wife at [facility] today 08/25/11.  Questions and concerns addressed

## 2011-11-10 ENCOUNTER — Other Ambulatory Visit: Payer: Self-pay | Admitting: Surgery

## 2011-11-10 DIAGNOSIS — D381 Neoplasm of uncertain behavior of trachea, bronchus and lung: Secondary | ICD-10-CM

## 2011-12-19 ENCOUNTER — Other Ambulatory Visit (HOSPITAL_COMMUNITY): Payer: BC Managed Care – PPO

## 2011-12-27 ENCOUNTER — Encounter: Payer: BC Managed Care – PPO | Admitting: Surgery

## 2012-06-13 ENCOUNTER — Other Ambulatory Visit: Payer: Self-pay | Admitting: Rehabilitation

## 2012-06-13 DIAGNOSIS — M545 Low back pain: Secondary | ICD-10-CM

## 2013-10-03 ENCOUNTER — Other Ambulatory Visit: Payer: Self-pay | Admitting: Internal Medicine

## 2013-10-03 DIAGNOSIS — R1900 Intra-abdominal and pelvic swelling, mass and lump, unspecified site: Secondary | ICD-10-CM

## 2013-10-10 ENCOUNTER — Ambulatory Visit
Admission: RE | Admit: 2013-10-10 | Discharge: 2013-10-10 | Disposition: A | Discharge status: Home / Self Care | Payer: BC Managed Care – PPO | Source: Ambulatory Visit | Attending: Internal Medicine | Admitting: Internal Medicine

## 2013-10-10 DIAGNOSIS — R1900 Intra-abdominal and pelvic swelling, mass and lump, unspecified site: Secondary | ICD-10-CM

## 2013-11-11 ENCOUNTER — Encounter: Payer: Self-pay | Admitting: Vascular Surgery

## 2013-11-12 ENCOUNTER — Encounter: Payer: Self-pay | Admitting: Vascular Surgery

## 2013-11-12 ENCOUNTER — Ambulatory Visit (INDEPENDENT_AMBULATORY_CARE_PROVIDER_SITE_OTHER): Payer: BC Managed Care – PPO | Admitting: Vascular Surgery

## 2013-11-12 VITALS — BP 147/95 | HR 71 | Resp 16 | Ht 67.0 in | Wt 145.0 lb

## 2013-11-12 DIAGNOSIS — I714 Abdominal aortic aneurysm, without rupture, unspecified: Secondary | ICD-10-CM | POA: Insufficient documentation

## 2013-11-12 DIAGNOSIS — R5381 Other malaise: Secondary | ICD-10-CM

## 2013-11-12 DIAGNOSIS — Z01810 Encounter for preprocedural cardiovascular examination: Secondary | ICD-10-CM

## 2013-11-12 DIAGNOSIS — R5383 Other fatigue: Secondary | ICD-10-CM

## 2013-11-12 DIAGNOSIS — M79609 Pain in unspecified limb: Secondary | ICD-10-CM

## 2013-11-12 DIAGNOSIS — M79604 Pain in right leg: Secondary | ICD-10-CM

## 2013-11-12 DIAGNOSIS — R531 Weakness: Secondary | ICD-10-CM | POA: Insufficient documentation

## 2013-11-12 NOTE — Progress Notes (Signed)
Patient name: [name] [name] MRN: [id number] DOB: 12/08/1940 Sex: male   Referred by: Minna Antis  Reason for referral:  Chief Complaint  Patient presents with  . New Evaluation    Ref. by Dr. Minna Antis  C/O  5.2 cm AAA on U/S of 10-10-13  .  Pt. has pain in Lower back and bilat. leg, duration several yrs.  Weakness Right arm > left arm, and Bilateral leg, duration  years.    HISTORY OF PRESENT ILLNESS: The patient presents today for discussion of ultrasound finding of 5.2 cm abdominal aortic aneurysm. He underwent ultrasound for his Medicare screening I have the results from this from 10/10/2013. This revealed a 5.2 cm infrarenal abdominal aortic aneurysm. The patient had no prior knowledge of this. The family history of aneurysm. He has been in good medical health. Does have some hypertension but no cardiac disease. He is here today with his wife. Assessment chronic low back pain.  Past Medical History  Diagnosis Date  . Hypertension   . Rheumatoid arthritis(714.0)   . Osteoporosis   . Lung nodule 06/2011    Past Surgical History  Procedure Laterality Date  . Inguinal hernia repair  2008    History   Social History  . Marital Status: Married    Spouse Name: N/A    Number of Children: N/A  . Years of Education: N/A   Occupational History  . Not on file.   Social History Main Topics  . Smoking status: Former Smoker    Types: Cigarettes    Quit date: 03/26/2009  . Smokeless tobacco: Never Used     Comment: SMOKED 10 CIGS PER DAY  . Alcohol Use: No  . Drug Use: No  . Sexual Activity: Not on file   Other Topics Concern  . Not on file   Social History Narrative  . No narrative on file    Family History  Problem Relation Age of Onset  . Hypertension Father   . Stroke Father   . Hypertension Mother   . Diabetes Mother   . Hyperlipidemia Mother     Allergies as of 11/12/2013  . (No Known Allergies)    Current Outpatient Prescriptions on File Prior to Visit    Medication Sig Dispense Refill  . benazepril (LOTENSIN) 10 MG tablet Take 10 mg by mouth daily.      . Calcium 600-200 MG-UNIT per tablet Take 1 tablet by mouth 2 (two) times daily.      . fish oil-omega-3 fatty acids 1000 MG capsule Take 1 g by mouth daily.      . folic acid (FOLVITE) 1 MG tablet Take 1 mg by mouth daily.      . hydroxychloroquine (PLAQUENIL) 200 MG tablet Take 200 mg by mouth 2 (two) times daily.      . methotrexate 2.5 MG tablet Take by mouth 3 (three) times a week.      . Misc Natural Products (OSTEO BI-FLEX JOINT SHIELD) TABS Take by mouth 1 day or 1 dose.      . Multiple Vitamin (MULTIVITAMIN) tablet Take 1 tablet by mouth daily.      . vitamin E (VITAMIN E) 400 UNIT capsule Take 400 Units by mouth daily.      Marland Kitchen POTASSIUM GLUCONATE PO Take 100 mg by mouth 1 day or 1 dose.      . temazepam (RESTORIL) 15 MG capsule Take 1 capsule (15 mg total) by mouth at bedtime as needed for sleep.  30 capsule  5  . temazepam (RESTORIL) 15 MG capsule Take 1 capsule (15 mg total) by mouth at bedtime as needed for sleep.  30 capsule  5   No current facility-administered medications on file prior to visit.     REVIEW OF SYSTEMS:  Positives indicated with an "X"  CARDIOVASCULAR:  [ ]  chest pain   [ ]  chest pressure   [ ]  palpitations   [ ]  orthopnea   [ ]  dyspnea on exertion   [ ]  claudication   [ ]  rest pain   [ ]  DVT   [ ]  phlebitis PULMONARY:   [ ]  productive cough   [ ]  asthma   [ ]  wheezing NEUROLOGIC:   [x ] weakness  [ ]  paresthesias  [ ]  aphasia  [ ]  amaurosis  [ ]  dizziness HEMATOLOGIC:   [ ]  bleeding problems   [ ]  clotting disorders MUSCULOSKELETAL:  [ ]  joint pain   [ ]  joint swelling GASTROINTESTINAL: [ ]   blood in stool  [ ]   hematemesis GENITOURINARY:  [ ]   dysuria  [ ]   hematuria PSYCHIATRIC:  [ ]  history of major depression INTEGUMENTARY:  [ ]  rashes  [ ]  ulcers CONSTITUTIONAL:  [ ]  fever   [ ]  chills  PHYSICAL EXAMINATION:  General: The patient is a  well-nourished male, in no acute distress. Vital signs are BP 147/95  Pulse 71  Resp 16  Ht 5\' 7"  (1.702 m)  Wt 145 lb (65.772 kg)  BMI 22.71 kg/m2  SpO2 97% Pulmonary: There is a good air exchange bilaterally without wheezing or rales. Abdomen: Soft and non-tender with normal pitch bowel sounds. He does have easily palpable nontender abdominal aortic aneurysm below below the xiphoid Musculoskeletal: There are no major deformities.  There is no significant extremity pain. Neurologic: No focal weakness or paresthesias are detected, Skin: There are no ulcer or rashes noted. Psychiatric: The patient has normal affect. Cardiovascular: There is a regular rate and rhythm without significant murmur appreciated. Pulse status: 2+ radial 2+ femoral to posterior popliteal to dorsalis pedis pulses bilaterally Carotid arteries without bruits bilaterally  Vascular Lab Studies:  I reviewed his ultrasound revealing 5.2 cm aneurysm  Impression and Plan:  Paternal discussion with the patient and his wife present. Explained the significance of his 5.2 cm aneurysm. I explained the natural progression of this. I have recommended CT scan for further evaluation. I did explain treatment options to include open aneurysm repair stent graft repair. I explained that typically surgery for aneurysm treatment is recommended to me 5-5.5 cm aneurysm size. We will obtain a CT scan of his abdomen and pelvis see him back for further discussion and recommendations. I did explain leaking aneurysm symptoms he knows to report immediately to the emergency room should this occur   Talyia Allende [facility] Office: [phone]

## 2013-11-13 ENCOUNTER — Other Ambulatory Visit: Payer: Self-pay | Admitting: Vascular Surgery

## 2013-11-14 LAB — CREATININE, SERUM: Creat: 0.76 mg/dL (ref 0.50–1.35)

## 2013-11-14 LAB — BUN: BUN: 13 mg/dL (ref 6–23)

## 2013-11-18 ENCOUNTER — Encounter: Payer: Self-pay | Admitting: Vascular Surgery

## 2013-11-18 ENCOUNTER — Ambulatory Visit
Admission: RE | Admit: 2013-11-18 | Discharge: 2013-11-18 | Disposition: A | Discharge status: Home / Self Care | Payer: BC Managed Care – PPO | Source: Ambulatory Visit | Attending: Vascular Surgery | Admitting: Vascular Surgery

## 2013-11-18 DIAGNOSIS — I714 Abdominal aortic aneurysm, without rupture, unspecified: Secondary | ICD-10-CM

## 2013-11-18 DIAGNOSIS — Z01810 Encounter for preprocedural cardiovascular examination: Secondary | ICD-10-CM

## 2013-11-18 MED ORDER — IOHEXOL 350 MG/ML SOLN
80.0000 mL | Freq: Once | INTRAVENOUS | Status: AC | PRN
  Administered 2013-11-18: 80 mL via INTRAVENOUS

## 2013-11-19 ENCOUNTER — Encounter: Payer: Self-pay | Admitting: Vascular Surgery

## 2013-11-19 ENCOUNTER — Ambulatory Visit (INDEPENDENT_AMBULATORY_CARE_PROVIDER_SITE_OTHER): Payer: BC Managed Care – PPO | Admitting: Vascular Surgery

## 2013-11-19 VITALS — BP 144/89 | HR 75 | Ht 67.0 in | Wt 146.7 lb

## 2013-11-19 DIAGNOSIS — I714 Abdominal aortic aneurysm, without rupture, unspecified: Secondary | ICD-10-CM

## 2013-11-19 DIAGNOSIS — Z01810 Encounter for preprocedural cardiovascular examination: Secondary | ICD-10-CM

## 2013-11-19 NOTE — Progress Notes (Signed)
Here today for continued discussion of his infrarenal abdominal aortic aneurysm. This was a recent discovery on screening ultrasound and is seen today for discussion of recent CT scan for further evaluation. Continues to have no symptoms referable to his aneurysm  Past Medical History  Diagnosis Date  . Hypertension   . Rheumatoid arthritis(714.0)   . Osteoporosis   . Lung nodule 06/2011    History  Substance Use Topics  . Smoking status: Former Smoker    Types: Cigarettes    Quit date: 03/26/2009  . Smokeless tobacco: Never Used     Comment: SMOKED 10 CIGS PER DAY  . Alcohol Use: No    Family History  Problem Relation Age of Onset  . Hypertension Father   . Stroke Father   . Hypertension Mother   . Diabetes Mother   . Hyperlipidemia Mother     No Known Allergies  Current outpatient prescriptions:benazepril (LOTENSIN) 10 MG tablet, Take 10 mg by mouth daily., Disp: , Rfl: ;  Calcium 600-200 MG-UNIT per tablet, Take 1 tablet by mouth 2 (two) times daily., Disp: , Rfl: ;  Cholecalciferol (D 2000) 2000 UNITS TABS, Take by mouth daily., Disp: , Rfl: ;  fish oil-omega-3 fatty acids 1000 MG capsule, Take 1 g by mouth daily., Disp: , Rfl: ;  folic acid (FOLVITE) 1 MG tablet, Take 1 mg by mouth daily., Disp: , Rfl:  hydroxychloroquine (PLAQUENIL) 200 MG tablet, Take 200 mg by mouth 2 (two) times daily., Disp: , Rfl: ;  methotrexate 2.5 MG tablet, Take by mouth 3 (three) times a week., Disp: , Rfl: ;  metoprolol succinate (TOPROL-XL) 25 MG 24 hr tablet, Take 25 mg by mouth daily., Disp: , Rfl: ;  Misc Natural Products (OSTEO BI-FLEX JOINT SHIELD) TABS, Take by mouth 1 day or 1 dose., Disp: , Rfl:  Multiple Vitamin (MULTIVITAMIN) tablet, Take 1 tablet by mouth daily., Disp: , Rfl: ;  POTASSIUM GLUCONATE PO, Take 100 mg by mouth 1 day or 1 dose., Disp: , Rfl: ;  temazepam (RESTORIL) 15 MG capsule, Take 15 mg by mouth at bedtime., Disp: , Rfl: ;  vitamin E (VITAMIN E) 400 UNIT capsule, Take 400  Units by mouth daily., Disp: , Rfl:  temazepam (RESTORIL) 15 MG capsule, Take 1 capsule (15 mg total) by mouth at bedtime as needed for sleep., Disp: 30 capsule, Rfl: 5;  temazepam (RESTORIL) 15 MG capsule, Take 1 capsule (15 mg total) by mouth at bedtime as needed for sleep., Disp: 30 capsule, Rfl: 5  BP 144/89  Pulse 75  Ht 5\' 7"  (1.702 m)  Wt 146 lb 11.2 oz (66.543 kg)  BMI 22.97 kg/m2  SpO2 100%  Body mass index is 22.97 kg/(m^2).       On physical exam he has no tenderness over his aneurysm. Otherwise no change  CT scan reveals a 5.7 cm infrarenal abdominal aortic aneurysm with ectasia into the iliac arteries. He does have some irregularity below the level of the renal arteries before the aneurysm begins  A very long discussion with the patient and his wife present. Have recommended elective repair. I explained treatment for stent graft repair and open repair. We will review his films with the device manufacturer to determine if he is a candidate for stent graft. We will discuss this with him further. We will obtain cardiac evaluation. He has no known history of cardiac disease. This is for preoperative planning. I did discuss the procedure potential risk open and stent graft  repair

## 2013-11-22 ENCOUNTER — Other Ambulatory Visit: Payer: BC Managed Care – PPO

## 2013-11-28 ENCOUNTER — Other Ambulatory Visit: Payer: Self-pay

## 2013-11-29 ENCOUNTER — Ambulatory Visit (INDEPENDENT_AMBULATORY_CARE_PROVIDER_SITE_OTHER): Payer: BC Managed Care – PPO | Admitting: Cardiovascular Disease

## 2013-11-29 ENCOUNTER — Encounter: Payer: Self-pay | Admitting: Cardiovascular Disease

## 2013-11-29 VITALS — BP 140/82 | HR 75 | Ht 67.0 in | Wt 149.0 lb

## 2013-11-29 DIAGNOSIS — I714 Abdominal aortic aneurysm, without rupture, unspecified: Secondary | ICD-10-CM

## 2013-11-29 DIAGNOSIS — M069 Rheumatoid arthritis, unspecified: Secondary | ICD-10-CM

## 2013-11-29 DIAGNOSIS — I10 Essential (primary) hypertension: Secondary | ICD-10-CM

## 2013-11-29 NOTE — Assessment & Plan Note (Signed)
Clear to have surgery He has no cardiac symptoms, normal ECG and no significant cardiac history Per guidelines he does not need stress testing especially if he is going to be done with stenting rather than open procedure

## 2013-11-29 NOTE — Progress Notes (Signed)
Patient ID: [name] [name], male   DOB: 11-18-1940, 73 y.o.   MRN: [id number]    73 yo referred by Dr Early for preop clearance.  She has a 5.7 cm infrarenal AAA found on screening US  History of HTN on Rx  No history of arrhythmia, CAD or CHF Previous smoker quitting in 2011.  No bleeding diathesis or previous issue with anesthesia.   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  Last surger about 10 years ago with left hernia no complications.  Has superficial right mandibular skin infection that was just worked on and is  On antibiotics.  Activity limited by arthritis.  No chest pain , dyspnea palpitations or syncope      ROS: Denies fever, malais, weight loss, blurry vision, decreased visual acuity, cough, sputum, SOB, hemoptysis, pleuritic pain, palpitaitons, heartburn, abdominal pain, melena, lower extremity edema, claudication, or rash.  All other systems reviewed and negative   General: Affect appropriate Healthy:  appears stated age 73: dressing over right mandible  Neck supple with no adenopathy JVP normal no bruits no thyromegaly Lungs clear with no wheezing and good diaphragmatic motion Heart:  S1/S2 no murmur,rub, gallop or click PMI normal Abdomen: benighn, BS positve, no tenderness,  AAA palpible  no bruit.  No HSM or HJR Distal pulses intact with no bruits No edema Neuro non-focal Skin warm and dry No muscular weakness  Medications Current Outpatient Prescriptions  Medication Sig Dispense Refill  . benazepril (LOTENSIN) 10 MG tablet Take 10 mg by mouth daily.      . Calcium 600-200 MG-UNIT per tablet Take 1 tablet by mouth 2 (two) times daily.      . Cholecalciferol (D 2000) 2000 UNITS TABS Take by mouth daily.      . fish oil-omega-3 fatty acids 1000 MG capsule Take 1 g by mouth daily.      . folic acid (FOLVITE) 1 MG tablet Take 1 mg by mouth daily.      . hydroxychloroquine (PLAQUENIL) 200 MG tablet Take  200 mg by mouth 2 (two) times daily.      . methotrexate 2.5 MG tablet Take by mouth 3 (three) times a week.      . metoprolol succinate (TOPROL-XL) 25 MG 24 hr tablet Take 25 mg by mouth daily.      . Misc Natural Products (OSTEO BI-FLEX JOINT SHIELD) TABS Take by mouth 1 day or 1 dose.      . Multiple Vitamin (MULTIVITAMIN) tablet Take 1 tablet by mouth daily.      Marland Kitchen POTASSIUM GLUCONATE PO Take 100 mg by mouth 1 day or 1 dose.      . temazepam (RESTORIL) 15 MG capsule Take 1 capsule (15 mg total) by mouth at bedtime as needed for sleep.  30 capsule  5  . temazepam (RESTORIL) 15 MG capsule Take 1 capsule (15 mg total) by mouth at bedtime as needed for sleep.  30 capsule  5  . temazepam (RESTORIL) 15 MG capsule Take 15 mg by mouth at bedtime.      . vitamin E (VITAMIN E) 400 UNIT capsule Take 400 Units by mouth daily.       No current facility-administered medications for this visit.    Allergies Review of patient's allergies indicates no known allergies.  Family History: Family History  Problem Relation Age of Onset  . Hypertension Father   . Stroke Father   .  Hypertension Mother   . Diabetes Mother   . Hyperlipidemia Mother     Social History: History   Social History  . Marital Status: Married    Spouse Name: N/A    Number of Children: N/A  . Years of Education: N/A   Occupational History  . Not on file.   Social History Main Topics  . Smoking status: Former Smoker    Types: Cigarettes    Quit date: 03/26/2009  . Smokeless tobacco: Never Used     Comment: SMOKED 10 CIGS PER DAY  . Alcohol Use: No  . Drug Use: No  . Sexual Activity: Not on file   Other Topics Concern  . Not on file   Social History Narrative  . No narrative on file    Electrocardiogram:  NSR normal ECG rate 76    Assessment and Plan

## 2013-11-29 NOTE — Assessment & Plan Note (Signed)
Well controlled.  Continue current medications and low sodium Dash type diet.    

## 2013-11-29 NOTE — Patient Instructions (Signed)
Your physician recommends that you schedule a follow-up appointment as needed with Dr. Johnsie Cancel. Your physician recommends that you continue on your current medications as directed. Please refer to the Current Medication list given to you today.

## 2013-11-29 NOTE — Assessment & Plan Note (Addendum)
May worsen post op continue anti inflamatories   F/u rheum  On methotrexate and plaquenil to avoid steroids

## 2013-12-04 NOTE — Pre-Procedure Instructions (Signed)
AHKEEM GOEDE  12/04/2013   Your procedure is scheduled on:  Wed, Oct 14 @ 8:30 AM  Report to [facility]  at 6:30 AM.  Call this number if you have problems the morning of surgery: [phone]   Remember:   Do not eat food or drink liquids after midnight.   Take these medicines the morning of surgery with A SIP OF WATER: Metoprolol(Toprol)                Stop taking your Vit E,Fish Oil,Methotrexate,and any other Vitamins or Herbals. No Goody's,BC's,Aleve,Aspirin, or Ibuprofen    Do not wear jewelry  Do not wear lotions, powders, or colognes. You may wear deodorant.  Men may shave face and neck.  Do not bring valuables to the hospital.  [facility] is not responsible                  for any belongings or valuables.               Contacts, dentures or bridgework may not be worn into surgery.  Leave suitcase in the car. After surgery it may be brought to your room.  For patients admitted to the hospital, discharge time is determined by your                treatment team.                  Special Instructions:  [place] - Preparing for Surgery  Before surgery, you can play an important role.  Because skin is not sterile, your skin needs to be as free of germs as possible.  You can reduce the number of germs on you skin by washing with CHG (chlorahexidine gluconate) soap before surgery.  CHG is an antiseptic cleaner which kills germs and bonds with the skin to continue killing germs even after washing.  Please DO NOT use if you have an allergy to CHG or antibacterial soaps.  If your skin becomes reddened/irritated stop using the CHG and inform your nurse when you arrive at Short Stay.  Do not shave (including legs and underarms) for at least 48 hours prior to the first CHG shower.  You may shave your face.  Please follow these instructions carefully:   1.  Shower with CHG Soap the night before surgery and the                                morning of Surgery.  2.  If you  choose to wash your hair, wash your hair first as usual with your       normal shampoo.  3.  After you shampoo, rinse your hair and body thoroughly to remove the                      Shampoo.  4.  Use CHG as you would any other liquid soap.  You can apply chg directly       to the skin and wash gently with scrungie or a clean washcloth.  5.  Apply the CHG Soap to your body ONLY FROM THE NECK DOWN.        Do not use on open wounds or open sores.  Avoid contact with your eyes,       ears, mouth and genitals (private parts).  Wash genitals (private parts)  with your normal soap.  6.  Wash thoroughly, paying special attention to the area where your surgery        will be performed.  7.  Thoroughly rinse your body with warm water from the neck down.  8.  DO NOT shower/wash with your normal soap after using and rinsing off       the CHG Soap.  9.  Pat yourself dry with a clean towel.            10.  Wear clean pajamas.            11.  Place clean sheets on your bed the night of your first shower and do not        sleep with pets.  Day of Surgery  Do not apply any lotions/deoderants the morning of surgery.  Please wear clean clothes to the hospital/surgery center.     Please read over the following fact sheets that you were given: Pain Booklet, Coughing and Deep Breathing, Blood Transfusion Information, MRSA Information and Surgical Site Infection Prevention

## 2013-12-05 ENCOUNTER — Encounter (HOSPITAL_COMMUNITY): Payer: Self-pay | Admitting: Pharmacy Technician

## 2013-12-05 ENCOUNTER — Encounter (HOSPITAL_COMMUNITY): Payer: Self-pay

## 2013-12-05 ENCOUNTER — Ambulatory Visit (HOSPITAL_COMMUNITY)
Admission: RE | Admit: 2013-12-05 | Discharge: 2013-12-05 | Disposition: A | Discharge status: Home / Self Care | Payer: BC Managed Care – PPO | Source: Ambulatory Visit | Attending: Anesthesiology | Admitting: Anesthesiology

## 2013-12-05 ENCOUNTER — Encounter (HOSPITAL_COMMUNITY)
Admission: RE | Admit: 2013-12-05 | Discharge: 2013-12-05 | Disposition: A | Discharge status: Home / Self Care | Payer: BC Managed Care – PPO | Source: Ambulatory Visit | Attending: Vascular Surgery | Admitting: Vascular Surgery

## 2013-12-05 DIAGNOSIS — I10 Essential (primary) hypertension: Secondary | ICD-10-CM

## 2013-12-05 DIAGNOSIS — I700 Atherosclerosis of aorta: Secondary | ICD-10-CM | POA: Diagnosis not present

## 2013-12-05 DIAGNOSIS — J439 Emphysema, unspecified: Secondary | ICD-10-CM | POA: Insufficient documentation

## 2013-12-05 DIAGNOSIS — Z01810 Encounter for preprocedural cardiovascular examination: Secondary | ICD-10-CM | POA: Diagnosis not present

## 2013-12-05 DIAGNOSIS — M479 Spondylosis, unspecified: Secondary | ICD-10-CM | POA: Insufficient documentation

## 2013-12-05 HISTORY — DX: Abdominal aortic aneurysm, without rupture: I71.4

## 2013-12-05 HISTORY — DX: Insomnia, unspecified: G47.00

## 2013-12-05 HISTORY — DX: Urgency of urination: R39.15

## 2013-12-05 HISTORY — DX: Effusion, unspecified joint: M25.40

## 2013-12-05 HISTORY — DX: Abdominal aortic aneurysm, without rupture, unspecified: I71.40

## 2013-12-05 HISTORY — DX: Benign prostatic hyperplasia without lower urinary tract symptoms: N40.0

## 2013-12-05 HISTORY — DX: Pneumonia, unspecified organism: J18.9

## 2013-12-05 HISTORY — DX: Frequency of micturition: R35.0

## 2013-12-05 HISTORY — DX: Pain in unspecified joint: M25.50

## 2013-12-05 HISTORY — DX: Nocturia: R35.1

## 2013-12-05 LAB — COMPREHENSIVE METABOLIC PANEL
ALT: 18 U/L (ref 0–53)
ANION GAP: 12 (ref 5–15)
AST: 24 U/L (ref 0–37)
Albumin: 4 g/dL (ref 3.5–5.2)
Alkaline Phosphatase: 64 U/L (ref 39–117)
BUN: 14 mg/dL (ref 6–23)
CALCIUM: 9.1 mg/dL (ref 8.4–10.5)
CO2: 22 mEq/L (ref 19–32)
Chloride: 104 mEq/L (ref 96–112)
Creatinine, Ser: 0.66 mg/dL (ref 0.50–1.35)
GFR calc Af Amer: >90 mL/min (ref >90)
GFR calc non Af Amer: >90 mL/min (ref >90)
GLUCOSE: 93 mg/dL (ref 70–99)
Potassium: 4.2 mEq/L (ref 3.7–5.3)
Sodium: 138 mEq/L (ref 137–147)
TOTAL PROTEIN: 7.2 g/dL (ref 6.0–8.3)
Total Bilirubin: 0.3 mg/dL (ref 0.3–1.2)

## 2013-12-05 LAB — APTT: APTT: 31 s (ref 24–37)

## 2013-12-05 LAB — CBC
HCT: 39.1 % (ref 39.0–52.0)
Hemoglobin: 13.8 g/dL (ref 13.0–17.0)
MCH: 33.3 pg (ref 26.0–34.0)
MCHC: 35.3 g/dL (ref 30.0–36.0)
MCV: 94.2 fL (ref 78.0–100.0)
Platelets: 218 10*3/uL (ref 150–400)
RBC: 4.15 MIL/uL — ABNORMAL LOW (ref 4.22–5.81)
RDW: 13.1 % (ref 11.5–15.5)
WBC: 5.4 10*3/uL (ref 4.0–10.5)

## 2013-12-05 LAB — URINALYSIS, ROUTINE W REFLEX MICROSCOPIC
Bilirubin Urine: NEGATIVE
GLUCOSE, UA: NEGATIVE mg/dL
Hgb urine dipstick: NEGATIVE
KETONES UR: NEGATIVE mg/dL
Leukocytes, UA: NEGATIVE
Nitrite: NEGATIVE
Protein, ur: NEGATIVE mg/dL
Specific Gravity, Urine: 1.012 (ref 1.005–1.030)
Urobilinogen, UA: 0.2 mg/dL (ref 0.0–1.0)
pH: 6.5 (ref 5.0–8.0)

## 2013-12-05 LAB — PROTIME-INR
INR: 1 (ref 0.00–1.49)
Prothrombin Time: 13.2 seconds (ref 11.6–15.2)

## 2013-12-05 LAB — BLOOD GAS, ARTERIAL
ACID-BASE DEFICIT: 0.1 mmol/L (ref 0.0–2.0)
Bicarbonate: 23.9 mEq/L (ref 20.0–24.0)
DRAWN BY: 206361
FIO2: 0.21 %
O2 Saturation: 94.8 %
Patient temperature: 98.6
TCO2: 25.1 mmol/L (ref 0–100)
pCO2 arterial: 38 mmHg (ref 35.0–45.0)
pH, Arterial: 7.416 (ref 7.350–7.450)
pO2, Arterial: 72.1 mmHg — ABNORMAL LOW (ref 80.0–100.0)

## 2013-12-05 LAB — ABO/RH: ABO/RH(D): B POS

## 2013-12-05 LAB — TYPE AND SCREEN
ABO/RH(D): B POS
Antibody Screen: NEGATIVE

## 2013-12-05 LAB — SURGICAL PCR SCREEN
MRSA, PCR: NEGATIVE
Staphylococcus aureus: NEGATIVE

## 2013-12-05 MED ORDER — CHLORHEXIDINE GLUCONATE CLOTH 2 % EX PADS: 6.0000 | MEDICATED_PAD | Freq: Once | CUTANEOUS | Status: DC

## 2013-12-05 NOTE — Progress Notes (Addendum)
  Saw Dr.Nishan the other day for clearance but doesn't need to follow up  Denies ever having an echo/stress test/heart cath  EKG in epic from 11-29-13   Medical Md is Dr.Richard Minna Antis  Denies CXR in past yr

## 2013-12-10 MED ORDER — DEXTROSE 5 % IV SOLN
1.5000 g | INTRAVENOUS | Status: AC
  Administered 2013-12-11: 1.5 g via INTRAVENOUS
  Filled 2013-12-10: qty 1.5

## 2013-12-10 MED ORDER — SODIUM CHLORIDE 0.9 % IV SOLN: INTRAVENOUS | Status: DC

## 2013-12-11 ENCOUNTER — Other Ambulatory Visit: Payer: Self-pay | Admitting: *Deleted

## 2013-12-11 ENCOUNTER — Inpatient Hospital Stay (HOSPITAL_COMMUNITY): Payer: BC Managed Care – PPO

## 2013-12-11 ENCOUNTER — Inpatient Hospital Stay (HOSPITAL_COMMUNITY)
Admission: RE | Admit: 2013-12-11 | Discharge: 2013-12-12 | DRG: 269 | Disposition: A | Discharge status: Home / Self Care | Payer: BC Managed Care – PPO | Source: Ambulatory Visit | Attending: Vascular Surgery | Admitting: Vascular Surgery

## 2013-12-11 ENCOUNTER — Encounter (HOSPITAL_COMMUNITY): Payer: BC Managed Care – PPO | Admitting: Certified Registered"

## 2013-12-11 ENCOUNTER — Encounter (HOSPITAL_COMMUNITY): Payer: Self-pay | Admitting: Certified Registered"

## 2013-12-11 ENCOUNTER — Encounter (HOSPITAL_COMMUNITY)
Admission: RE | Disposition: A | Discharge status: Home / Self Care | Payer: Self-pay | Source: Ambulatory Visit | Attending: Vascular Surgery

## 2013-12-11 ENCOUNTER — Inpatient Hospital Stay (HOSPITAL_COMMUNITY): Payer: BC Managed Care – PPO | Admitting: Certified Registered"

## 2013-12-11 DIAGNOSIS — M069 Rheumatoid arthritis, unspecified: Secondary | ICD-10-CM | POA: Diagnosis present

## 2013-12-11 DIAGNOSIS — I714 Abdominal aortic aneurysm, without rupture, unspecified: Secondary | ICD-10-CM

## 2013-12-11 DIAGNOSIS — Z8249 Family history of ischemic heart disease and other diseases of the circulatory system: Secondary | ICD-10-CM | POA: Diagnosis not present

## 2013-12-11 DIAGNOSIS — I10 Essential (primary) hypertension: Secondary | ICD-10-CM | POA: Diagnosis present

## 2013-12-11 DIAGNOSIS — G4700 Insomnia, unspecified: Secondary | ICD-10-CM | POA: Diagnosis present

## 2013-12-11 DIAGNOSIS — Z48812 Encounter for surgical aftercare following surgery on the circulatory system: Secondary | ICD-10-CM

## 2013-12-11 DIAGNOSIS — M810 Age-related osteoporosis without current pathological fracture: Secondary | ICD-10-CM | POA: Diagnosis present

## 2013-12-11 DIAGNOSIS — Z79899 Other long term (current) drug therapy: Secondary | ICD-10-CM | POA: Diagnosis not present

## 2013-12-11 DIAGNOSIS — Z87891 Personal history of nicotine dependence: Secondary | ICD-10-CM

## 2013-12-11 HISTORY — PX: ABDOMINAL AORTIC ENDOVASCULAR STENT GRAFT: SHX5707

## 2013-12-11 LAB — BASIC METABOLIC PANEL
ANION GAP: 14 (ref 5–15)
BUN: 15 mg/dL (ref 6–23)
CHLORIDE: 102 meq/L (ref 96–112)
CO2: 23 mEq/L (ref 19–32)
Calcium: 8.3 mg/dL — ABNORMAL LOW (ref 8.4–10.5)
Creatinine, Ser: 0.75 mg/dL (ref 0.50–1.35)
GFR calc non Af Amer: >90 mL/min (ref >90)
Glucose, Bld: 85 mg/dL (ref 70–99)
Potassium: 4.1 mEq/L (ref 3.7–5.3)
Sodium: 139 mEq/L (ref 137–147)

## 2013-12-11 LAB — CBC
HCT: 34.3 % — ABNORMAL LOW (ref 39.0–52.0)
Hemoglobin: 11.9 g/dL — ABNORMAL LOW (ref 13.0–17.0)
MCH: 32.7 pg (ref 26.0–34.0)
MCHC: 34.7 g/dL (ref 30.0–36.0)
MCV: 94.2 fL (ref 78.0–100.0)
PLATELETS: 167 10*3/uL (ref 150–400)
RBC: 3.64 MIL/uL — ABNORMAL LOW (ref 4.22–5.81)
RDW: 13.1 % (ref 11.5–15.5)
WBC: 5.1 10*3/uL (ref 4.0–10.5)

## 2013-12-11 LAB — MAGNESIUM: Magnesium: 1.9 mg/dL (ref 1.5–2.5)

## 2013-12-11 LAB — PROTIME-INR
INR: 1.12 (ref 0.00–1.49)
Prothrombin Time: 14.6 seconds (ref 11.6–15.2)

## 2013-12-11 LAB — APTT: APTT: 33 s (ref 24–37)

## 2013-12-11 SURGERY — INSERTION, ENDOVASCULAR STENT GRAFT, AORTA, ABDOMINAL
Anesthesia: General | Site: Groin | Laterality: Bilateral

## 2013-12-11 MED ORDER — HYDROMORPHONE HCL 1 MG/ML IJ SOLN
INTRAMUSCULAR | Status: AC
  Administered 2013-12-11: 0.5 mg via INTRAVENOUS
  Filled 2013-12-11: qty 1

## 2013-12-11 MED ORDER — HYDROMORPHONE HCL 1 MG/ML IJ SOLN
0.2500 mg | INTRAMUSCULAR | Status: DC | PRN
  Administered 2013-12-11: 0.5 mg via INTRAVENOUS

## 2013-12-11 MED ORDER — MORPHINE SULFATE 2 MG/ML IJ SOLN
INTRAMUSCULAR | Status: AC
  Filled 2013-12-11: qty 1

## 2013-12-11 MED ORDER — ROCURONIUM BROMIDE 100 MG/10ML IV SOLN
INTRAVENOUS | Status: DC | PRN
  Administered 2013-12-11: 50 mg via INTRAVENOUS

## 2013-12-11 MED ORDER — SODIUM CHLORIDE 0.9 % IV SOLN: INTRAVENOUS | Status: DC

## 2013-12-11 MED ORDER — ARTIFICIAL TEARS OP OINT
TOPICAL_OINTMENT | OPHTHALMIC | Status: DC | PRN
  Administered 2013-12-11: 1 via OPHTHALMIC

## 2013-12-11 MED ORDER — ACETAMINOPHEN 325 MG PO TABS
325.0000 mg | ORAL_TABLET | ORAL | Status: DC | PRN
Start: 2013-12-11 — End: 2013-12-12

## 2013-12-11 MED ORDER — OMEGA-3-ACID ETHYL ESTERS 1 G PO CAPS
1.0000 g | ORAL_CAPSULE | Freq: Every day | ORAL | Status: DC
  Administered 2013-12-11: 1 g via ORAL
  Filled 2013-12-11 (×2): qty 1

## 2013-12-11 MED ORDER — ONE-DAILY MULTI VITAMINS PO TABS: 1.0000 | ORAL_TABLET | Freq: Every day | ORAL | Status: DC

## 2013-12-11 MED ORDER — OSTEO BI-FLEX JOINT SHIELD PO TABS: 1.0000 | ORAL_TABLET | Freq: Every day | ORAL | Status: DC

## 2013-12-11 MED ORDER — PROPOFOL 10 MG/ML IV BOLUS
INTRAVENOUS | Status: DC | PRN
  Administered 2013-12-11: 200 mg via INTRAVENOUS

## 2013-12-11 MED ORDER — PANTOPRAZOLE SODIUM 40 MG PO TBEC
40.0000 mg | DELAYED_RELEASE_TABLET | Freq: Every day | ORAL | Status: DC
  Administered 2013-12-11 – 2013-12-12 (×2): 40 mg via ORAL
  Filled 2013-12-11 (×2): qty 1

## 2013-12-11 MED ORDER — DEXTROSE 5 % IV SOLN
1.5000 g | Freq: Two times a day (BID) | INTRAVENOUS | Status: AC
  Administered 2013-12-11 – 2013-12-12 (×2): 1.5 g via INTRAVENOUS
  Filled 2013-12-11 (×2): qty 1.5

## 2013-12-11 MED ORDER — OXYCODONE HCL 5 MG/5ML PO SOLN: 5.0000 mg | Freq: Once | ORAL | Status: DC | PRN

## 2013-12-11 MED ORDER — GLYCOPYRROLATE 0.2 MG/ML IJ SOLN
INTRAMUSCULAR | Status: AC
  Filled 2013-12-11: qty 2

## 2013-12-11 MED ORDER — METOPROLOL TARTRATE 1 MG/ML IV SOLN
2.0000 mg | INTRAVENOUS | Status: DC | PRN
  Filled 2013-12-11: qty 5

## 2013-12-11 MED ORDER — TEMAZEPAM 15 MG PO CAPS: 15.0000 mg | ORAL_CAPSULE | Freq: Every day | ORAL | Status: DC | PRN

## 2013-12-11 MED ORDER — GLYCOPYRROLATE 0.2 MG/ML IJ SOLN
INTRAMUSCULAR | Status: DC | PRN
  Administered 2013-12-11: 0.3 mg via INTRAVENOUS

## 2013-12-11 MED ORDER — PHENOL 1.4 % MT LIQD: 1.0000 | OROMUCOSAL | Status: DC | PRN

## 2013-12-11 MED ORDER — 0.9 % SODIUM CHLORIDE (POUR BTL) OPTIME
TOPICAL | Status: DC | PRN
  Administered 2013-12-11: 1000 mL

## 2013-12-11 MED ORDER — DOPAMINE-DEXTROSE 3.2-5 MG/ML-% IV SOLN: 3.0000 ug/kg/min | INTRAVENOUS | Status: DC | PRN

## 2013-12-11 MED ORDER — HYDRALAZINE HCL 20 MG/ML IJ SOLN
10.0000 mg | INTRAMUSCULAR | Status: DC | PRN
  Administered 2013-12-11: 13:00:00 via INTRAVENOUS
  Filled 2013-12-11: qty 0.5
  Filled 2013-12-11: qty 1

## 2013-12-11 MED ORDER — ONDANSETRON HCL 4 MG/2ML IJ SOLN
INTRAMUSCULAR | Status: AC
  Filled 2013-12-11: qty 2

## 2013-12-11 MED ORDER — HYDRALAZINE HCL 20 MG/ML IJ SOLN
INTRAMUSCULAR | Status: AC
  Filled 2013-12-11: qty 1

## 2013-12-11 MED ORDER — ENOXAPARIN SODIUM 40 MG/0.4ML ~~LOC~~ SOLN
40.0000 mg | SUBCUTANEOUS | Status: DC
  Administered 2013-12-12: 40 mg via SUBCUTANEOUS
  Filled 2013-12-11: qty 0.4

## 2013-12-11 MED ORDER — METHOTREXATE 2.5 MG PO TABS: 10.0000 mg | ORAL_TABLET | ORAL | Status: DC

## 2013-12-11 MED ORDER — OXYCODONE HCL 5 MG PO TABS: 5.0000 mg | ORAL_TABLET | ORAL | Status: DC | PRN

## 2013-12-11 MED ORDER — LIDOCAINE HCL (CARDIAC) 20 MG/ML IV SOLN
INTRAVENOUS | Status: AC
  Filled 2013-12-11: qty 5

## 2013-12-11 MED ORDER — METOPROLOL SUCCINATE ER 25 MG PO TB24
25.0000 mg | ORAL_TABLET | Freq: Every day | ORAL | Status: DC
  Filled 2013-12-11: qty 1

## 2013-12-11 MED ORDER — PHENYLEPHRINE HCL 10 MG/ML IJ SOLN
10.0000 mg | INTRAVENOUS | Status: DC | PRN
  Administered 2013-12-11: 30 ug/min via INTRAVENOUS

## 2013-12-11 MED ORDER — VITAMIN D3 25 MCG (1000 UNIT) PO TABS
2000.0000 [IU] | ORAL_TABLET | Freq: Every day | ORAL | Status: DC
  Administered 2013-12-11 – 2013-12-12 (×2): 2000 [IU] via ORAL
  Filled 2013-12-11 (×2): qty 2

## 2013-12-11 MED ORDER — LACTATED RINGERS IV SOLN
INTRAVENOUS | Status: DC | PRN
  Administered 2013-12-11 (×3): via INTRAVENOUS

## 2013-12-11 MED ORDER — MAGNESIUM SULFATE 40 MG/ML IJ SOLN: 2.0000 g | Freq: Every day | INTRAMUSCULAR | Status: DC | PRN

## 2013-12-11 MED ORDER — MORPHINE SULFATE 2 MG/ML IJ SOLN
2.0000 mg | INTRAMUSCULAR | Status: DC | PRN
  Administered 2013-12-11: 2 mg via INTRAVENOUS

## 2013-12-11 MED ORDER — PROPOFOL 10 MG/ML IV BOLUS
INTRAVENOUS | Status: AC
  Filled 2013-12-11: qty 20

## 2013-12-11 MED ORDER — HEPARIN SODIUM (PORCINE) 1000 UNIT/ML IJ SOLN
INTRAMUSCULAR | Status: DC | PRN
  Administered 2013-12-11: 5000 [IU] via INTRAVENOUS

## 2013-12-11 MED ORDER — FENTANYL CITRATE 0.05 MG/ML IJ SOLN
INTRAMUSCULAR | Status: AC
  Filled 2013-12-11: qty 5

## 2013-12-11 MED ORDER — LIDOCAINE HCL (CARDIAC) 20 MG/ML IV SOLN
INTRAVENOUS | Status: DC | PRN
  Administered 2013-12-11: 80 mg via INTRAVENOUS

## 2013-12-11 MED ORDER — METHOTREXATE 2.5 MG PO TABS: 7.5000 mg | ORAL_TABLET | ORAL | Status: DC

## 2013-12-11 MED ORDER — ROCURONIUM BROMIDE 50 MG/5ML IV SOLN
INTRAVENOUS | Status: AC
  Filled 2013-12-11: qty 1

## 2013-12-11 MED ORDER — ARTIFICIAL TEARS OP OINT
TOPICAL_OINTMENT | OPHTHALMIC | Status: AC
  Filled 2013-12-11: qty 3.5

## 2013-12-11 MED ORDER — OXYCODONE HCL 5 MG PO TABS: 5.0000 mg | ORAL_TABLET | Freq: Once | ORAL | Status: DC | PRN

## 2013-12-11 MED ORDER — GUAIFENESIN-DM 100-10 MG/5ML PO SYRP: 15.0000 mL | ORAL_SOLUTION | ORAL | Status: DC | PRN

## 2013-12-11 MED ORDER — CHOLECALCIFEROL 50 MCG (2000 UT) PO TABS: 1.0000 | ORAL_TABLET | Freq: Every day | ORAL | Status: DC

## 2013-12-11 MED ORDER — VITAMIN E 180 MG (400 UNIT) PO CAPS
400.0000 [IU] | ORAL_CAPSULE | Freq: Two times a day (BID) | ORAL | Status: DC
  Administered 2013-12-11 – 2013-12-12 (×2): 400 [IU] via ORAL
  Filled 2013-12-11 (×3): qty 1

## 2013-12-11 MED ORDER — FOLIC ACID 1 MG PO TABS
1.0000 mg | ORAL_TABLET | Freq: Every day | ORAL | Status: DC
  Administered 2013-12-11 – 2013-12-12 (×2): 1 mg via ORAL
  Filled 2013-12-11 (×2): qty 1

## 2013-12-11 MED ORDER — CALCIUM CARBONATE-VITAMIN D 500-200 MG-UNIT PO TABS
1.0000 | ORAL_TABLET | Freq: Every day | ORAL | Status: DC
  Administered 2013-12-12: 1 via ORAL
  Filled 2013-12-11 (×2): qty 1

## 2013-12-11 MED ORDER — CALCIUM 600-200 MG-UNIT PO TABS: 1.0000 | ORAL_TABLET | Freq: Every day | ORAL | Status: DC

## 2013-12-11 MED ORDER — IODIXANOL 320 MG/ML IV SOLN
INTRAVENOUS | Status: DC | PRN
  Administered 2013-12-11: 60.5 mL via INTRAVENOUS

## 2013-12-11 MED ORDER — ALUM & MAG HYDROXIDE-SIMETH 200-200-20 MG/5ML PO SUSP: 15.0000 mL | ORAL | Status: DC | PRN

## 2013-12-11 MED ORDER — ONDANSETRON HCL 4 MG/2ML IJ SOLN
4.0000 mg | Freq: Four times a day (QID) | INTRAMUSCULAR | Status: DC | PRN
  Administered 2013-12-11 – 2013-12-12 (×2): 4 mg via INTRAVENOUS
  Filled 2013-12-11: qty 2

## 2013-12-11 MED ORDER — DOCUSATE SODIUM 100 MG PO CAPS
100.0000 mg | ORAL_CAPSULE | Freq: Every day | ORAL | Status: DC
  Administered 2013-12-12: 100 mg via ORAL
  Filled 2013-12-11: qty 1

## 2013-12-11 MED ORDER — PROTAMINE SULFATE 10 MG/ML IV SOLN
INTRAVENOUS | Status: AC
  Filled 2013-12-11: qty 5

## 2013-12-11 MED ORDER — TEMAZEPAM 15 MG PO CAPS
15.0000 mg | ORAL_CAPSULE | Freq: Every day | ORAL | Status: DC
  Administered 2013-12-11: 15 mg via ORAL
  Filled 2013-12-11: qty 1

## 2013-12-11 MED ORDER — SODIUM CHLORIDE 0.9 % IV SOLN: 500.0000 mL | Freq: Once | INTRAVENOUS | Status: AC | PRN

## 2013-12-11 MED ORDER — ONDANSETRON HCL 4 MG/2ML IJ SOLN
INTRAMUSCULAR | Status: DC | PRN
  Administered 2013-12-11: 4 mg via INTRAVENOUS

## 2013-12-11 MED ORDER — LABETALOL HCL 5 MG/ML IV SOLN
10.0000 mg | INTRAVENOUS | Status: DC | PRN
  Filled 2013-12-11: qty 4

## 2013-12-11 MED ORDER — MIDAZOLAM HCL 2 MG/2ML IJ SOLN
INTRAMUSCULAR | Status: AC
  Filled 2013-12-11: qty 2

## 2013-12-11 MED ORDER — OMEGA-3 FATTY ACIDS 1000 MG PO CAPS: 1.0000 g | ORAL_CAPSULE | Freq: Every day | ORAL | Status: DC

## 2013-12-11 MED ORDER — ADULT MULTIVITAMIN W/MINERALS CH
1.0000 | ORAL_TABLET | Freq: Every day | ORAL | Status: DC
  Administered 2013-12-12: 1 via ORAL
  Filled 2013-12-11: qty 1

## 2013-12-11 MED ORDER — MIDAZOLAM HCL 5 MG/5ML IJ SOLN
INTRAMUSCULAR | Status: DC | PRN
  Administered 2013-12-11 (×2): 1 mg via INTRAVENOUS

## 2013-12-11 MED ORDER — HYDROXYCHLOROQUINE SULFATE 200 MG PO TABS
200.0000 mg | ORAL_TABLET | Freq: Two times a day (BID) | ORAL | Status: DC
  Administered 2013-12-11 – 2013-12-12 (×2): 200 mg via ORAL
  Filled 2013-12-11 (×3): qty 1

## 2013-12-11 MED ORDER — BISACODYL 10 MG RE SUPP: 10.0000 mg | Freq: Every day | RECTAL | Status: DC | PRN

## 2013-12-11 MED ORDER — HEPARIN SODIUM (PORCINE) 1000 UNIT/ML IJ SOLN
INTRAMUSCULAR | Status: AC
  Filled 2013-12-11: qty 1

## 2013-12-11 MED ORDER — SODIUM CHLORIDE 0.9 % IR SOLN
Status: DC | PRN
  Administered 2013-12-11 (×2)

## 2013-12-11 MED ORDER — ONDANSETRON HCL 4 MG/2ML IJ SOLN: 4.0000 mg | Freq: Four times a day (QID) | INTRAMUSCULAR | Status: DC | PRN

## 2013-12-11 MED ORDER — ACETAMINOPHEN 650 MG RE SUPP: 325.0000 mg | RECTAL | Status: DC | PRN

## 2013-12-11 MED ORDER — HYDROMORPHONE HCL 1 MG/ML IJ SOLN
0.5000 mg | INTRAMUSCULAR | Status: DC | PRN
  Administered 2013-12-12: 0.5 mg via INTRAVENOUS
  Filled 2013-12-11 (×2): qty 1

## 2013-12-11 MED ORDER — PROTAMINE SULFATE 10 MG/ML IV SOLN
INTRAVENOUS | Status: DC | PRN
  Administered 2013-12-11 (×5): 10 mg via INTRAVENOUS

## 2013-12-11 MED ORDER — POTASSIUM CHLORIDE CRYS ER 20 MEQ PO TBCR: 20.0000 meq | EXTENDED_RELEASE_TABLET | Freq: Every day | ORAL | Status: DC | PRN

## 2013-12-11 MED ORDER — NEOSTIGMINE METHYLSULFATE 10 MG/10ML IV SOLN
INTRAVENOUS | Status: DC | PRN
  Administered 2013-12-11: 2 mg via INTRAVENOUS

## 2013-12-11 MED ORDER — METHOTREXATE 2.5 MG PO TABS: 17.5000 mg | ORAL_TABLET | ORAL | Status: DC

## 2013-12-11 MED ORDER — FENTANYL CITRATE 0.05 MG/ML IJ SOLN
INTRAMUSCULAR | Status: DC | PRN
  Administered 2013-12-11 (×3): 50 ug via INTRAVENOUS
  Administered 2013-12-11 (×2): 100 ug via INTRAVENOUS

## 2013-12-11 SURGICAL SUPPLY — 94 items
5F BER ANGIOGRAPHIC CATHETER ×3 IMPLANT
BAG BANDED W/RUBBER/TAPE 36X54 (MISCELLANEOUS) ×3 IMPLANT
BAG SNAP BAND KOVER 36X36 (MISCELLANEOUS) ×3 IMPLANT
BENZOIN TINCTURE PRP APPL 2/3 (GAUZE/BANDAGES/DRESSINGS) ×6 IMPLANT
BLADE SURG 10 STRL SS (BLADE) ×3 IMPLANT
CANISTER SUCTION 2500CC (MISCELLANEOUS) ×3 IMPLANT
CATH BEACON 5.038 65CM KMP-01 (CATHETERS) ×3 IMPLANT
CATH HEADHUNTER 5FR 65CM (MISCELLANEOUS) ×3 IMPLANT
CATH OMNI FLUSH .035X70CM (CATHETERS) ×3 IMPLANT
CATH VANSCH 5FR 6CM (CATHETERS) ×3 IMPLANT
CLIP LIGATING EXTRA MED SLVR (CLIP) IMPLANT
CLIP LIGATING EXTRA SM BLUE (MISCELLANEOUS) IMPLANT
CLOSURE STERI-STRIP 1/2X4 (GAUZE/BANDAGES/DRESSINGS) ×2
CLOSURE WOUND 1/2 X4 (GAUZE/BANDAGES/DRESSINGS) ×1
CLSR STERI-STRIP ANTIMIC 1/2X4 (GAUZE/BANDAGES/DRESSINGS) ×4 IMPLANT
COVER DOME SNAP 22 D (MISCELLANEOUS) ×3 IMPLANT
COVER MAYO STAND STRL (DRAPES) ×3 IMPLANT
COVER PROBE W GEL 5X96 (DRAPES) ×3 IMPLANT
COVER SURGICAL LIGHT HANDLE (MISCELLANEOUS) ×3 IMPLANT
DEVICE CLOSURE PERCLS PRGLD 6F (VASCULAR PRODUCTS) ×4 IMPLANT
DEVICE TORQUE H2O (MISCELLANEOUS) ×3 IMPLANT
DRAPE TABLE COVER HEAVY DUTY (DRAPES) ×3 IMPLANT
DRSG TEGADERM 2-3/8X2-3/4 SM (GAUZE/BANDAGES/DRESSINGS) ×6 IMPLANT
DRYSEAL FLEXSHEATH 12FR 33CM (SHEATH) ×2
DRYSEAL FLEXSHEATH 18FR 33CM (SHEATH) ×2
ELECT CAUTERY BLADE 6.4 (BLADE) IMPLANT
ELECT REM PT RETURN 9FT ADLT (ELECTROSURGICAL) ×6
ELECTRODE REM PT RTRN 9FT ADLT (ELECTROSURGICAL) ×2 IMPLANT
EXCLUDER TNK 28X14.5MMX12CM (Endovascular Graft) ×1 IMPLANT
EXCLUDER TRUNK 28X14.5MMX12CM (Endovascular Graft) ×3 IMPLANT
GAUZE SPONGE 2X2 8PLY STRL LF (GAUZE/BANDAGES/DRESSINGS) ×2 IMPLANT
GLOVE BIO SURGEON STRL SZ 6.5 (GLOVE) ×2 IMPLANT
GLOVE BIO SURGEONS STRL SZ 6.5 (GLOVE) ×1
GLOVE BIOGEL PI IND STRL 6.5 (GLOVE) ×4 IMPLANT
GLOVE BIOGEL PI IND STRL 7.5 (GLOVE) ×1 IMPLANT
GLOVE BIOGEL PI INDICATOR 6.5 (GLOVE) ×8
GLOVE BIOGEL PI INDICATOR 7.5 (GLOVE) ×2
GLOVE SS BIOGEL STRL SZ 7 (GLOVE) ×1 IMPLANT
GLOVE SS BIOGEL STRL SZ 7.5 (GLOVE) ×1 IMPLANT
GLOVE SUPERSENSE BIOGEL SZ 7 (GLOVE) ×2
GLOVE SUPERSENSE BIOGEL SZ 7.5 (GLOVE) ×2
GLOVE SURG SS PI 7.0 STRL IVOR (GLOVE) ×6 IMPLANT
GOWN STRL REUS W/ TWL LRG LVL3 (GOWN DISPOSABLE) ×2 IMPLANT
GOWN STRL REUS W/ TWL XL LVL3 (GOWN DISPOSABLE) ×1 IMPLANT
GOWN STRL REUS W/TWL LRG LVL3 (GOWN DISPOSABLE) ×4
GOWN STRL REUS W/TWL XL LVL3 (GOWN DISPOSABLE) ×2
GRAFT BALLN CATH 65CM (STENTS) ×1 IMPLANT
GRAFT EXCLUDER LEG 12X14 (Endovascular Graft) ×3 IMPLANT
GUIDEWIRE ANGLED .035X150CM (WIRE) ×3 IMPLANT
KIT BASIN OR (CUSTOM PROCEDURE TRAY) ×3 IMPLANT
KIT ROOM TURNOVER OR (KITS) ×3 IMPLANT
LEG CONTRALATERAL 23X10 (Vascular Products) ×3 IMPLANT
NEEDLE PERC 18GX7CM (NEEDLE) ×3 IMPLANT
NS IRRIG 1000ML POUR BTL (IV SOLUTION) ×3 IMPLANT
PACK AORTA (CUSTOM PROCEDURE TRAY) ×3 IMPLANT
PAD ARMBOARD 7.5X6 YLW CONV (MISCELLANEOUS) ×6 IMPLANT
PENCIL BUTTON HOLSTER BLD 10FT (ELECTRODE) IMPLANT
PERCLOSE PROGLIDE 6F (VASCULAR PRODUCTS) ×12
PROBE PENCIL 8 MHZ STRL DISP (MISCELLANEOUS) ×3 IMPLANT
PROTECTION STATION PRESSURIZED (MISCELLANEOUS) ×3
SHEATH AVANTI 11CM 8FR (MISCELLANEOUS) ×3 IMPLANT
SHEATH BRITE TIP 8FR 23CM (MISCELLANEOUS) ×3 IMPLANT
SHEATH DRYSEAL FLEX 12FR 33CM (SHEATH) ×1 IMPLANT
SHEATH DRYSEAL FLEX 18FR 33CM (SHEATH) ×1 IMPLANT
SLEEVE SURGEON STRL (DRAPES) ×3 IMPLANT
SPONGE GAUZE 2X2 STER 10/PKG (GAUZE/BANDAGES/DRESSINGS) ×4
STAPLER VISISTAT 35W (STAPLE) IMPLANT
STATION PROTECTION PRESSURIZED (MISCELLANEOUS) ×1 IMPLANT
STENT GRAFT BALLN CATH 65CM (STENTS) ×2
STOPCOCK MORSE 400PSI 3WAY (MISCELLANEOUS) ×3 IMPLANT
STRIP CLOSURE SKIN 1/2X4 (GAUZE/BANDAGES/DRESSINGS) ×2 IMPLANT
SUT ETHILON 3 0 PS 1 (SUTURE) IMPLANT
SUT PROLENE 5 0 C 1 24 (SUTURE) ×3 IMPLANT
SUT SILK 2 0 (SUTURE) ×2
SUT SILK 2-0 18XBRD TIE 12 (SUTURE) ×1 IMPLANT
SUT SILK 3 0 (SUTURE) ×2
SUT SILK 3-0 18XBRD TIE 12 (SUTURE) ×1 IMPLANT
SUT VIC AB 2-0 CTX 36 (SUTURE) IMPLANT
SUT VIC AB 3-0 SH 18 (SUTURE) IMPLANT
SUT VIC AB 3-0 SH 27 (SUTURE)
SUT VIC AB 3-0 SH 27X BRD (SUTURE) IMPLANT
SUT VICRYL 4-0 PS2 18IN ABS (SUTURE) ×6 IMPLANT
SYR 20CC LL (SYRINGE) ×6 IMPLANT
SYR 30ML LL (SYRINGE) IMPLANT
SYR 5ML LL (SYRINGE) ×3 IMPLANT
SYR MEDRAD MARK V 150ML (SYRINGE) ×3 IMPLANT
SYRINGE 10CC LL (SYRINGE) ×9 IMPLANT
TAPE CLOTH SURG 4X10 WHT LF (GAUZE/BANDAGES/DRESSINGS) ×3 IMPLANT
TOWEL OR 17X24 6PK STRL BLUE (TOWEL DISPOSABLE) IMPLANT
TOWEL OR 17X26 10 PK STRL BLUE (TOWEL DISPOSABLE) IMPLANT
TRAY FOLEY CATH 16FRSI W/METER (SET/KITS/TRAYS/PACK) ×3 IMPLANT
TUBING HIGH PRESSURE 120CM (CONNECTOR) ×3 IMPLANT
WIRE AMPLATZ SS-J .035X180CM (WIRE) ×9 IMPLANT
WIRE BENTSON .035X145CM (WIRE) ×6 IMPLANT

## 2013-12-11 NOTE — Op Note (Signed)
OPERATIVE REPORT  DATE OF SURGERY: 12/11/2013  PATIENT: [name] [name], 73 y.o. male MRN: [id number]  DOB: 05/15/1940  PRE-OPERATIVE DIAGNOSIS: Abdominal aortic aneurysm  POST-OPERATIVE DIAGNOSIS:  Same  PROCEDURE:Gore excluder stent graft repair of abdominal aortic aneurysm  SURGEON:  Curt Jews, M.D.  ASSISTANT: Dr. Alda Lea  ANESTHESIA:  Gen.  EBL: Minimal ml  Total I/O In: 1500 [I.V.:1500] Out: 460 [Urine:360; Blood:100]  BLOOD ADMINISTERED: None  DRAINS: None  SPECIMEN: None  COUNTS CORRECT:  YES  PLAN OF CARE: To PACU   PATIENT DISPOSITION:  PACU - hemodynamically stable  PROCEDURE DETAILS: Patient was taken to placed above the area both groins were prepped and draped in usual sterile fashion. SonoSite option was used to access the common femoral artery with 18-gauge needle bilaterally. A dilator was passed over the guidewire and 2 separate Perclose devices were placed at 10:00 and 2:00 and partially deployed. A long 8 French sheath was placed on the right and a short 8 [place] sheath on the left. A marker pigtail catheter is positioned l right groin for local suprarenal aorta. On the left, an Amplatz superstiff wire was positioned through catheter and the 18 [place] dilator was passed to the level of the aneurysm sac. The main body device was a 28 x 14 x 12 cm was placed in the left sheath. Was positioned in a crossed leg position. Injection through the suprarenal catheter from the right side of the level of the left and right renal arteries and the main body was partially deployed and again the injection should this excellent position just level of the renal artery takeoff. The pigtail catheter was withdrawn down into the aneurysm sac and the cath was used to cannulate the contralateral gate. Was cannulated catheter was repositioned inside the graft twisted to confirm that this was intra-graft. The left limb extender was a 12 x 14 cm length. This was extended down  into the external iliac her on the right. 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 Finally the iliac extender on the left was a 23 x 10 cm this was positioned after her retrograde injection from the left sheath revealed the level of the hypogastric per takeoff. This does lead to just above the hypogastric artery takeoff. The upper and lower attachment sites and junctions were dilated with a Q50 balloon. The catheter was replaced below the renal arteries and a completion arteriogram revealed excellent positioning with no evidence of endoleak. The devices and sheath were removed. The Perclose were closed with good hemostasis. There was excellent pulse on the right. There was a question of a diminished pulse on the left. Doppler signal was present and sewn somewhat stenotic. For this reason the left groin was explored. Oblique incision was made carried down to the level of the Perclose device. The artery was exposed proximally and distally and the Perclose device removed and the artery was opened slightly transversely. There was some irregular plaque present. There was no thrombus. The artery on the morning.0 Prolene suture. Clamps were flow was noted. The wounds were closed with 3-0 Vicryl in several layers and subcutaneous tissue. Skin was closed 4 septic or Vicryl stitch. Should be mentioned that the patient was given 5000 of intravenous heparin prior to placement of 18 French right groin sheath and this was reversed after removal of the sheath. The patient patient was transferred to the recovery was stable condition   Curt Jews, M.D. 12/11/2013 12:03 PM

## 2013-12-11 NOTE — Anesthesia Postprocedure Evaluation (Signed)
Anesthesia Post Note  Patient: [name] [name]  Procedure(s) Performed: Procedure(s) (LRB): ABDOMINAL AORTIC ENDOVASCULAR STENT GRAFT; left groin exploration (Bilateral)  Anesthesia type: General  Patient location: PACU  Post pain: Pain level controlled and Adequate analgesia  Post assessment: Post-op Vital signs reviewed, Patient's Cardiovascular Status Stable, Respiratory Function Stable, Patent Airway and Pain level controlled  Last Vitals:  Filed Vitals:   12/11/13 1245  BP: 158/86  Pulse: 65  Temp:   Resp: 9    Post vital signs: Reviewed and stable  Level of consciousness: awake, alert  and oriented  Complications: No apparent anesthesia complications

## 2013-12-11 NOTE — Interval H&P Note (Signed)
History and Physical Interval Note:  12/11/2013 8:10 AM  Florencia Reasons  has presented today for surgery, with the diagnosis of Aortic aneurysm, abdominal   The various methods of treatment have been discussed with the patient and family. After consideration of risks, benefits and other options for treatment, the patient has consented to  Procedure(s): ABDOMINAL AORTIC ENDOVASCULAR STENT GRAFT (N/A) as a surgical intervention .  The patient's history has been reviewed, patient examined, no change in status, stable for surgery.  I have reviewed the patient's chart and labs.  Questions were answered to the patient's satisfaction.     EARLY, TODD

## 2013-12-11 NOTE — Anesthesia Procedure Notes (Signed)
Procedure Name: Intubation Date/Time: 12/11/2013 8:47 AM Performed by: Barrington Ellison Pre-anesthesia Checklist: Patient identified, Emergency Drugs available, Suction available, Patient being monitored and Timeout performed Patient Re-evaluated:Patient Re-evaluated prior to inductionOxygen Delivery Method: Circle system utilized Preoxygenation: Pre-oxygenation with 100% oxygen Intubation Type: IV induction Ventilation: Mask ventilation without difficulty Laryngoscope Size: Mac and 3 Grade View: Grade II Tube type: Oral Tube size: 7.5 mm Number of attempts: 1 Airway Equipment and Method: Stylet Placement Confirmation: ETT inserted through vocal cords under direct vision,  positive ETCO2 and breath sounds checked- equal and bilateral Secured at: 23 cm Tube secured with: Tape Dental Injury: Teeth and Oropharynx as per pre-operative assessment

## 2013-12-11 NOTE — Progress Notes (Signed)
  [facility] Progress Note  12/11/2013 3:24 PM Day of Surgery  Subjective:  Patient seen in PACU. Having some pain around groins. Otherwise no complaints.   Tmax 98.2  BP sys 130s-170s 02 98% 2L  Filed Vitals:   12/11/13 1515  BP:   Pulse: 71  Temp:   Resp: 11    Physical Exam: Incisions:  Right incision with minor sanguinous drainage on gauze. Left incision clean. No groin hematoma bilaterally. Extremities:  2+ DP/PT pulses bilaterally Cardiac: regular rate and rhythm Lungs: non labored breathing, clear to auscultation bilaterally Abdomen: + bowel sounds, non tender to palpation  CBC    Component Value Date/Time   WBC 5.1 12/11/2013 1140   RBC 3.64* 12/11/2013 1140   HGB 11.9* 12/11/2013 1140   HCT 34.3* 12/11/2013 1140   PLT 167 12/11/2013 1140   MCV 94.2 12/11/2013 1140   MCH 32.7 12/11/2013 1140   MCHC 34.7 12/11/2013 1140   RDW 13.1 12/11/2013 1140   MONOABS 0.7 05/03/2007 1208   EOSABS 0.2 05/03/2007 1208   BASOSABS 0.1 05/03/2007 1208    BMET    Component Value Date/Time   NA 139 12/11/2013 1140   K 4.1 12/11/2013 1140   CL 102 12/11/2013 1140   CO2 23 12/11/2013 1140   GLUCOSE 85 12/11/2013 1140   BUN 15 12/11/2013 1140   CREATININE 0.75 12/11/2013 1140   CREATININE 0.76 11/13/2013 1103   CALCIUM 8.3* 12/11/2013 1140   GFRNONAA >90 12/11/2013 1140   GFRAA >90 12/11/2013 1140    INR    Component Value Date/Time   INR 1.12 12/11/2013 1140     Intake/Output Summary (Last 24 hours) at 12/11/13 1524 Last data filed at 12/11/13 1330  Gross per 24 hour  Intake   1500 ml  Output   1160 ml  Net    340 ml     Assessment:  73 y.o. male is s/p: Gore excluder stent graft repair of abdominal aortic aneurysm  Day of Surgery  Plan: -Bilateral groins without hematoma, palpable 2+ DP/PT pulses bilaterally -BP stable, good UOP -Will move to 3S when bed available -DVT prophylaxis:  SCDs, lovenox   Virgina Jock, PA-C [facility] Office: [phone] Pager: [phone] 12/11/2013 3:24 PM

## 2013-12-11 NOTE — Progress Notes (Signed)
Pt arrived to unit from PACU accompanied by 2 RNs. Pt oriented to room/unit. VS stable.

## 2013-12-11 NOTE — H&P (View-Only) (Signed)
Here today for continued discussion of his infrarenal abdominal aortic aneurysm. This was a recent discovery on screening ultrasound and is seen today for discussion of recent CT scan for further evaluation. Continues to have no symptoms referable to his aneurysm  Past Medical History  Diagnosis Date  . Hypertension   . Rheumatoid arthritis(714.0)   . Osteoporosis   . Lung nodule 06/2011    History  Substance Use Topics  . Smoking status: Former Smoker    Types: Cigarettes    Quit date: 03/26/2009  . Smokeless tobacco: Never Used     Comment: SMOKED 10 CIGS PER DAY  . Alcohol Use: No    Family History  Problem Relation Age of Onset  . Hypertension Father   . Stroke Father   . Hypertension Mother   . Diabetes Mother   . Hyperlipidemia Mother     No Known Allergies  Current outpatient prescriptions:benazepril (LOTENSIN) 10 MG tablet, Take 10 mg by mouth daily., Disp: , Rfl: ;  Calcium 600-200 MG-UNIT per tablet, Take 1 tablet by mouth 2 (two) times daily., Disp: , Rfl: ;  Cholecalciferol (D 2000) 2000 UNITS TABS, Take by mouth daily., Disp: , Rfl: ;  fish oil-omega-3 fatty acids 1000 MG capsule, Take 1 g by mouth daily., Disp: , Rfl: ;  folic acid (FOLVITE) 1 MG tablet, Take 1 mg by mouth daily., Disp: , Rfl:  hydroxychloroquine (PLAQUENIL) 200 MG tablet, Take 200 mg by mouth 2 (two) times daily., Disp: , Rfl: ;  methotrexate 2.5 MG tablet, Take by mouth 3 (three) times a week., Disp: , Rfl: ;  metoprolol succinate (TOPROL-XL) 25 MG 24 hr tablet, Take 25 mg by mouth daily., Disp: , Rfl: ;  Misc Natural Products (OSTEO BI-FLEX JOINT SHIELD) TABS, Take by mouth 1 day or 1 dose., Disp: , Rfl:  Multiple Vitamin (MULTIVITAMIN) tablet, Take 1 tablet by mouth daily., Disp: , Rfl: ;  POTASSIUM GLUCONATE PO, Take 100 mg by mouth 1 day or 1 dose., Disp: , Rfl: ;  temazepam (RESTORIL) 15 MG capsule, Take 15 mg by mouth at bedtime., Disp: , Rfl: ;  vitamin E (VITAMIN E) 400 UNIT capsule, Take 400  Units by mouth daily., Disp: , Rfl:  temazepam (RESTORIL) 15 MG capsule, Take 1 capsule (15 mg total) by mouth at bedtime as needed for sleep., Disp: 30 capsule, Rfl: 5;  temazepam (RESTORIL) 15 MG capsule, Take 1 capsule (15 mg total) by mouth at bedtime as needed for sleep., Disp: 30 capsule, Rfl: 5  BP 144/89  Pulse 75  Ht 5\' 7"  (1.702 m)  Wt 146 lb 11.2 oz (66.543 kg)  BMI 22.97 kg/m2  SpO2 100%  Body mass index is 22.97 kg/(m^2).       On physical exam he has no tenderness over his aneurysm. Otherwise no change  CT scan reveals a 5.7 cm infrarenal abdominal aortic aneurysm with ectasia into the iliac arteries. He does have some irregularity below the level of the renal arteries before the aneurysm begins  A very long discussion with the patient and his wife present. Have recommended elective repair. I explained treatment for stent graft repair and open repair. We will review his films with the device manufacturer to determine if he is a candidate for stent graft. We will discuss this with him further. We will obtain cardiac evaluation. He has no known history of cardiac disease. This is for preoperative planning. I did discuss the procedure potential risk open and stent graft  repair

## 2013-12-11 NOTE — Transfer of Care (Signed)
Immediate Anesthesia Transfer of Care Note  Patient: [name] [name]  Procedure(s) Performed: Procedure(s): ABDOMINAL AORTIC ENDOVASCULAR STENT GRAFT (N/A)  Patient Location: PACU  Anesthesia Type:General  Level of Consciousness: awake  Airway & Oxygen Therapy: Patient Spontanous Breathing and Patient connected to nasal cannula oxygen  Post-op Assessment: Report given to PACU RN  Post vital signs: Reviewed and stable  Complications: No apparent anesthesia complications

## 2013-12-11 NOTE — Anesthesia Preprocedure Evaluation (Addendum)
Anesthesia Evaluation  Patient identified by MRN, date of birth, ID band Patient awake    Reviewed: Allergy & Precautions, H&P , NPO status , Patient's Chart, lab work & pertinent test results  Airway Mallampati: II  Neck ROM: full    Dental  (+) Teeth Intact, Poor Dentition, Chipped, Dental Advisory Given   Pulmonary former smoker,          Cardiovascular hypertension, + Peripheral Vascular Disease Rhythm:Regular Rate:Normal     Neuro/Psych  Neuromuscular disease    GI/Hepatic   Endo/Other    Renal/GU      Musculoskeletal  (+) Arthritis -,   Abdominal   Peds  Hematology   Anesthesia Other Findings   Reproductive/Obstetrics                          Anesthesia Physical Anesthesia Plan  ASA: III  Anesthesia Plan: General   Post-op Pain Management:    Induction: Intravenous  Airway Management Planned: Oral ETT  Additional Equipment:   Intra-op Plan:   Post-operative Plan: Extubation in OR  Informed Consent: I have reviewed the patients History and Physical, chart, labs and discussed the procedure including the risks, benefits and alternatives for the proposed anesthesia with the patient or authorized representative who has indicated his/her understanding and acceptance.     Plan Discussed with: CRNA, Anesthesiologist and Surgeon  Anesthesia Plan Comments:         Anesthesia Quick Evaluation

## 2013-12-12 ENCOUNTER — Encounter (HOSPITAL_COMMUNITY): Payer: Self-pay | Admitting: *Deleted

## 2013-12-12 ENCOUNTER — Telehealth: Payer: Self-pay | Admitting: Vascular Surgery

## 2013-12-12 LAB — BASIC METABOLIC PANEL
Anion gap: 15 (ref 5–15)
BUN: 15 mg/dL (ref 6–23)
CHLORIDE: 97 meq/L (ref 96–112)
CO2: 22 meq/L (ref 19–32)
Calcium: 8.5 mg/dL (ref 8.4–10.5)
Creatinine, Ser: 0.71 mg/dL (ref 0.50–1.35)
GFR calc Af Amer: >90 mL/min (ref >90)
GFR calc non Af Amer: >90 mL/min (ref >90)
GLUCOSE: 81 mg/dL (ref 70–99)
POTASSIUM: 3.7 meq/L (ref 3.7–5.3)
SODIUM: 134 meq/L — AB (ref 137–147)

## 2013-12-12 LAB — CBC
HCT: 36.4 % — ABNORMAL LOW (ref 39.0–52.0)
HEMOGLOBIN: 12.7 g/dL — AB (ref 13.0–17.0)
MCH: 32.7 pg (ref 26.0–34.0)
MCHC: 34.9 g/dL (ref 30.0–36.0)
MCV: 93.8 fL (ref 78.0–100.0)
Platelets: 187 10*3/uL (ref 150–400)
RBC: 3.88 MIL/uL — ABNORMAL LOW (ref 4.22–5.81)
RDW: 13.2 % (ref 11.5–15.5)
WBC: 10.7 10*3/uL — ABNORMAL HIGH (ref 4.0–10.5)

## 2013-12-12 MED ORDER — INFLUENZA VAC SPLIT QUAD 0.5 ML IM SUSY: 0.5000 mL | PREFILLED_SYRINGE | INTRAMUSCULAR | Status: DC

## 2013-12-12 MED ORDER — TRAMADOL HCL 50 MG PO TABS: 50.0000 mg | ORAL_TABLET | Freq: Four times a day (QID) | ORAL | Status: AC | PRN

## 2013-12-12 MED ORDER — PNEUMOCOCCAL VAC POLYVALENT 25 MCG/0.5ML IJ INJ: 0.5000 mL | INJECTION | INTRAMUSCULAR | Status: DC

## 2013-12-12 MED ORDER — METHOTREXATE 2.5 MG PO TABS: 7.5000 mg | ORAL_TABLET | ORAL | Status: DC

## 2013-12-12 MED ORDER — TRAMADOL HCL 50 MG PO TABS: 50.0000 mg | ORAL_TABLET | Freq: Four times a day (QID) | ORAL | Status: DC | PRN

## 2013-12-12 MED ORDER — METHOTREXATE 2.5 MG PO TABS: 10.0000 mg | ORAL_TABLET | ORAL | Status: DC

## 2013-12-12 MED ORDER — PROMETHAZINE HCL 25 MG PO TABS
12.5000 mg | ORAL_TABLET | Freq: Four times a day (QID) | ORAL | Status: DC | PRN
  Administered 2013-12-12: 12.5 mg via ORAL
  Filled 2013-12-12: qty 1

## 2013-12-12 NOTE — Discharge Summary (Signed)
[facility] Discharge Summary  [name] [name] September 14, 1940 73 y.o. male  [id number]  Admission Date: 12/11/2013  Discharge Date: 12/12/2013  Physician: Curt Jews, MD  Admission Diagnosis: Aortic aneurysm, abdominal    HPI:   This is a 73 y.o. male who was seen by Dr. Donnetta Hutching after a recent discovery of an infrarenal abdominal aortic aneurysm was found on screening ultrasound. A CT can revealed a 5.7 cm infrarenal abdominal aortic aneurysm with ectasia into the iliac arteries. There was discussion regarding stent graft versus open repair. The films were reviewed with the device manufacturer. The patient was sent for preoperative cardiac evaluation. He was a candidate for stent graft repair and was cleared by cardiology.   Hospital Course:  The patient was admitted to the hospital and taken to the operating room on 12/11/2013 and underwent: Gore excluder stent graft repair of abdominal aortic aneurysm  The patient tolerated the procedure well and was transported to the PACU in stable condition. He was transferred to the stepdown unit. He complained of nausea and vomiting following use of morphine and dilaudid.   On POD 1, he had palpable pedal pulses bilaterally and his groin incisions were clean and intact without hematoma. He denied any abdominal pain or back pain. His blood pressure was stable. He was not having any further pain, but continued to have nausea. He was observed and encouraged to ambulate. The patient was observed until his nausea resolved and he was able to tolerate a diet. The patient was discharged later in the day on POD 1 in good condition.   CBC    Component Value Date/Time   WBC 10.7* 12/12/2013 0305   RBC 3.88* 12/12/2013 0305   HGB 12.7* 12/12/2013 0305   HCT 36.4* 12/12/2013 0305   PLT 187 12/12/2013 0305   MCV 93.8 12/12/2013 0305   MCH 32.7 12/12/2013 0305   MCHC 34.9 12/12/2013 0305   RDW 13.2 12/12/2013 0305   MONOABS 0.7 05/03/2007  1208   EOSABS 0.2 05/03/2007 1208   BASOSABS 0.1 05/03/2007 1208    BMET    Component Value Date/Time   NA 134* 12/12/2013 0305   K 3.7 12/12/2013 0305   CL 97 12/12/2013 0305   CO2 22 12/12/2013 0305   GLUCOSE 81 12/12/2013 0305   BUN 15 12/12/2013 0305   CREATININE 0.71 12/12/2013 0305   CREATININE 0.76 11/13/2013 1103   CALCIUM 8.5 12/12/2013 0305   GFRNONAA >90 12/12/2013 0305   GFRAA >90 12/12/2013 0305     Discharge Instructions:   The patient is discharged to home with extensive instructions on wound care and progressive ambulation.  They are instructed not to drive or perform any heavy lifting until returning to see the physician in his office.  Discharge Instructions   ABDOMINAL PROCEDURE/ANEURYSM REPAIR/AORTO-BIFEMORAL BYPASS:  Call MD for increased abdominal pain; cramping diarrhea; nausea/vomiting    Complete by:  As directed      Call MD for:  redness, tenderness, or signs of infection (pain, swelling, bleeding, redness, odor or green/yellow discharge around incision site)    Complete by:  As directed      Call MD for:  severe or increased pain, loss or decreased feeling  in affected limb(s)    Complete by:  As directed      Call MD for:  temperature >100.5    Complete by:  As directed      Discharge patient    Complete by:  As directed  Discharge pt to home     Driving Restrictions    Complete by:  As directed   No driving for 2 weeks     Lifting restrictions    Complete by:  As directed   No lifting for 4 weeks     Resume previous diet    Complete by:  As directed      may wash over wound with mild soap and water    Complete by:  As directed            Discharge Diagnosis:  Aortic aneurysm, abdominal   Secondary Diagnosis: Patient Active Problem List   Diagnosis Date Noted  . AAA (abdominal aortic aneurysm) without rupture 11/12/2013  . Pain of right lower extremity-Lower Back and  Legs 11/12/2013  . Weakness-Bilateral Leg and Right arm > Left arm  11/12/2013  . Hypertension   . Osteoporosis   . Lung nodule   . 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 OTHER ABNORMAL BLOOD CHEMISTRY 02/11/2009  . SCIATICA, RIGHT 07/31/2008  . TOBACCO ABUSE 05/03/2007  . PNEUMONIA, ORGANISM UNSPECIFIED 05/03/2007  . Rheumatoid arthritis 12/30/2006  . OSTEOPENIA 12/30/2006  . APPENDECTOMY, HX OF 12/30/2006  . Other Postprocedural Status 12/30/2006   Past Medical History  Diagnosis Date  . Lung nodule 2012  . Insomnia     takes Restoril nightly as needed  . Hypertension     takes Benazepril and Metoprolol daily  . Pneumonia     hx of;many yrs ago  . AAA (abdominal aortic aneurysm)   . Joint pain   . Joint swelling   . Urinary frequency     takes Terazosin daily  . Urinary urgency   . Nocturia   . Enlarged prostate     slightly  . Rheumatoid arthritis(714.0)     takes Plaquenil daily and Methotrexate weekly       Medication List         Calcium 600-200 MG-UNIT per tablet  Take 1 tablet by mouth daily.     D 2000 2000 UNITS Tabs  Generic drug:  Cholecalciferol  Take 1 tablet by mouth daily.     doxycycline 100 MG tablet  Commonly known as:  VIBRA-TABS     fish oil-omega-3 fatty acids 1000 MG capsule  Take 1 g by mouth daily.     folic acid 1 MG tablet  Commonly known as:  FOLVITE  Take 1 mg by mouth daily.     hydroxychloroquine 200 MG tablet  Commonly known as:  PLAQUENIL  Take 200 mg by mouth 2 (two) times daily.     methotrexate 2.5 MG tablet  Take 10-17.5 mg by mouth 2 (two) times a week. Takes 7.5 mg on Friday, and takes 10 mg on Saturday.     metoprolol succinate 25 MG 24 hr tablet  Commonly known as:  TOPROL-XL  Take 25 mg by mouth daily.     multivitamin tablet  Take 1 tablet by mouth daily.     OSTEO BI-FLEX JOINT SHIELD Tabs  Take 1 tablet by mouth daily.     temazepam 15 MG capsule  Commonly known as:  RESTORIL  Take 1 capsule (15 mg  total) by mouth at bedtime as needed for sleep.     traMADol 50 MG tablet  Commonly known as:  ULTRAM  Take 1 tablet (50 mg total) by mouth every 6 (six) hours as needed for moderate pain.  vitamin E 400 UNIT capsule  Generic drug:  vitamin E  Take 400 Units by mouth 2 (two) times daily.        Tramadol #20 No Refill  Disposition: Home  Patient's condition: is Good  Follow up: 1. Dr. Donnetta Hutching in 4 weeks with CTA   Virgina Jock, PA-C [facility] [phone] 12/12/2013  8:18 PM   - For VQI Registry use --- Instructions: Press F2 to tab through selections.  Delete question if not applicable.   Post-op:  Time to Extubation: [x]  In OR, [ ]  < 12 hrs, [ ]  12-24 hrs, [ ]  >=24 hrs Vasopressors Req. Post-op: No MI: No., [ ]  Troponin only, [ ]  EKG or Clinical New Arrhythmia: No CHF: No ICU Stay: 0 days Transfusion: No    Complications: Resp failure: No., [ ]  Pneumonia, [ ]  Ventilator Chg in renal function: No., [ ]  Inc. Cr > 0.5, [ ]  Temp. Dialysis, [ ]  Permanent dialysis Leg ischemia: No., no Surgery needed, [ ]  Yes, Surgery needed, [ ]  Amputation Bowel ischemia: No., [ ]  Medical Rx, [ ]  Surgical Rx Wound complication: No., [ ]  Superficial separation/infection, [ ]  Return to OR Return to OR: No  Return to OR for bleeding: No Stroke: No., [ ]  Minor, [ ]  Major  Discharge medications: Statin use:  No If No: [ ]  For Medical reasons, [ ]  Non-compliant, [x ] Not-indicated ASA use:  No  If No: [ ]  For Medical reasons, [ ]  Non-compliant, [ ]  Not-indicated [x]  Will recommend at follow up visit.  Plavix use:  No If No: [ ]  For Medical reasons, [ ]  Non-compliant, [ ]  Not-indicated Beta blocker use:  Yes If No: [ ]  For Medical reasons, [ ]  Non-compliant, [ ]  Not-indicated

## 2013-12-12 NOTE — Telephone Encounter (Addendum)
Message copied by Gena Fray on Thu Dec 12, 2013 10:19 AM ------      Message from: Peter Minium K      Created: Wed Dec 11, 2013  3:38 PM      Regarding: Schedule                   ----- Message -----         From: Alvia Grove, PA-C         Sent: 12/11/2013   3:33 PM           To: [facility] Charge Pool            S/p [facility] 12/11/13            F/u in 4 weeks withDr. Early with CTA.             Thanks,      Kim ------  12/12/13: patient is still currently admitted, unable to schedule CTA at this time. Will wait for patients discharge to call [facility].

## 2013-12-12 NOTE — Progress Notes (Signed)
Utilization review completed.  

## 2013-12-12 NOTE — Progress Notes (Signed)
Pt discharged at 1540 via wheelchair with family. patient  Acknowledged discharged instructions

## 2013-12-12 NOTE — Progress Notes (Addendum)
  [facility] Progress Note  12/12/2013 7:31 AM 1 Day Post-Op  Subjective:  Having nausea from pain medication. Did not tolerate morphine or dilaudid. Denies abdominal pain and back pain.   Tmax 98.3 BP sys 130s-170s 02 100% RA  Filed Vitals:   12/12/13 0517  BP:   Pulse:   Temp: 98.3 F (36.8 C)  Resp:     Physical Exam: Cardiac:  Regular rate and rhythm Lungs:  Regular respiratory effort Abdomen:  Non tender, soft to palpation, +BS Incisions:  Left groin incision clean and intact. Right groin sheath site clean. No hematoma bilaterally.  Extremities:  Palpable popliteal, DP, PT pulses bilaterally.   CBC    Component Value Date/Time   WBC 10.7* 12/12/2013 0305   RBC 3.88* 12/12/2013 0305   HGB 12.7* 12/12/2013 0305   HCT 36.4* 12/12/2013 0305   PLT 187 12/12/2013 0305   MCV 93.8 12/12/2013 0305   MCH 32.7 12/12/2013 0305   MCHC 34.9 12/12/2013 0305   RDW 13.2 12/12/2013 0305   MONOABS 0.7 05/03/2007 1208   EOSABS 0.2 05/03/2007 1208   BASOSABS 0.1 05/03/2007 1208    BMET    Component Value Date/Time   NA 134* 12/12/2013 0305   K 3.7 12/12/2013 0305   CL 97 12/12/2013 0305   CO2 22 12/12/2013 0305   GLUCOSE 81 12/12/2013 0305   BUN 15 12/12/2013 0305   CREATININE 0.71 12/12/2013 0305   CREATININE 0.76 11/13/2013 1103   CALCIUM 8.5 12/12/2013 0305   GFRNONAA >90 12/12/2013 0305   GFRAA >90 12/12/2013 0305    INR    Component Value Date/Time   INR 1.12 12/11/2013 1140     Intake/Output Summary (Last 24 hours) at 12/12/13 0731 Last data filed at 12/12/13 0517  Gross per 24 hour  Intake   2580 ml  Output   1710 ml  Net    870 ml     Assessment/Plan:  73 y.o. male is s/p: Gore excluder stent graft repair of abdominal aortic aneurysm  1 Day Post-Op  -Palpable DP/PT pulses bilaterally, groin incisions without hematoma -Having nausea from pain medications. Will try tylenol for pain control.  -Keep BP systolic <= 485I -Will  start clear liquids today.  -Ambulate.  -Dispo: Will monitor nausea. Discharge later today. He will follow up in 4 weeks with Dr. Donnetta Hutching with CTA.    Virgina Jock, PA-C [facility] Office: [phone] Pager: [phone] 12/12/2013 7:31 AM  I have examined the patient, reviewed and agree with above. Up in chair. Comfortable. We'll discharge later dissection and after nausea resolves.  Kunta Hilleary, MD 12/12/2013 8:29 AM

## 2013-12-13 NOTE — Care Management Note (Signed)
    Page 1 of 1   12/13/2013     10:51:19 AM CARE MANAGEMENT NOTE 12/13/2013  Patient:  [name] [name], [name] [name]   Account Number:  [id number]  Date Initiated:  12/12/2013  Documentation initiated by:  Marvetta Gibbons  Subjective/Objective Assessment:   Pt admitted s/p AAA repair     Action/Plan:   PTA pt lived at home- plan to return home   Anticipated DC Date:  12/12/2013   Anticipated DC Plan:  HOME/SELF CARE         Choice offered to / List presented to:             Status of service:  Completed, signed off Medicare Important Message given?  NA - LOS <3 / Initial given by admissions (If response is "NO", the following Medicare IM given date fields will be blank) Date Medicare IM given:   Medicare IM given by:   Date Additional Medicare IM given:   Additional Medicare IM given by:    Discharge Disposition:  HOME/SELF CARE  Per UR Regulation:  Reviewed for med. necessity/level of care/duration of stay  If discussed at [place] of Stay Meetings, dates discussed:    Comments:

## 2013-12-17 ENCOUNTER — Encounter (HOSPITAL_COMMUNITY): Payer: Self-pay | Admitting: Vascular Surgery

## 2013-12-19 ENCOUNTER — Institutional Professional Consult (permissible substitution): Payer: BC Managed Care – PPO | Admitting: Cardiology

## 2014-01-06 ENCOUNTER — Encounter: Payer: Self-pay | Admitting: Vascular Surgery

## 2014-01-07 ENCOUNTER — Ambulatory Visit
Admission: RE | Admit: 2014-01-07 | Discharge: 2014-01-07 | Disposition: A | Discharge status: Home / Self Care | Payer: BC Managed Care – PPO | Source: Ambulatory Visit | Attending: Vascular Surgery | Admitting: Vascular Surgery

## 2014-01-07 ENCOUNTER — Encounter: Payer: BC Managed Care – PPO | Admitting: Vascular Surgery

## 2014-01-07 DIAGNOSIS — I714 Abdominal aortic aneurysm, without rupture, unspecified: Secondary | ICD-10-CM

## 2014-01-07 DIAGNOSIS — Z48812 Encounter for surgical aftercare following surgery on the circulatory system: Secondary | ICD-10-CM

## 2014-01-07 MED ORDER — IOHEXOL 350 MG/ML SOLN
80.0000 mL | Freq: Once | INTRAVENOUS | Status: AC | PRN
  Administered 2014-01-07: 80 mL via INTRAVENOUS

## 2014-01-13 ENCOUNTER — Encounter: Payer: Self-pay | Admitting: Vascular Surgery

## 2014-01-14 ENCOUNTER — Encounter: Payer: Self-pay | Admitting: Vascular Surgery

## 2014-01-14 ENCOUNTER — Ambulatory Visit (INDEPENDENT_AMBULATORY_CARE_PROVIDER_SITE_OTHER): Payer: Self-pay | Admitting: Vascular Surgery

## 2014-01-14 VITALS — BP 146/82 | HR 71 | Ht 67.0 in | Wt 147.2 lb

## 2014-01-14 DIAGNOSIS — I714 Abdominal aortic aneurysm, without rupture, unspecified: Secondary | ICD-10-CM

## 2014-01-14 DIAGNOSIS — Z95828 Presence of other vascular implants and grafts: Secondary | ICD-10-CM

## 2014-01-14 NOTE — Progress Notes (Signed)
Here today for follow-up of stent graft repair of large abdominal aortic aneurysm by myself on 12/11/2013. He reports he still has some diminished stamina but otherwise is returned to his baseline. He does have very mild soreness in his puncture sites. His main complaint is of a right hip arthritic type symptoms.  On physical exam his left groin cutdown and right upper outer site are well-healed with normal pulses and normal popliteal pulses. Abdominal exam reveals no tenderness and no pulsatile mass  He did undergo CT scan on 01/07/2014 and I reviewed this with him. We looked at the actual films explained that he has had excellent result with excellent positioning of the stent graft and no endoleak.  He will resume full activities without limitation we will see him in 6 months with ultrasound of his stent graft repair

## 2014-04-25 ENCOUNTER — Encounter: Payer: Self-pay | Admitting: Cardiovascular Disease

## 2014-06-25 ENCOUNTER — Other Ambulatory Visit: Payer: Self-pay | Admitting: Rehabilitation

## 2014-06-25 DIAGNOSIS — M5136 Other intervertebral disc degeneration, lumbar region: Secondary | ICD-10-CM

## 2014-07-01 ENCOUNTER — Other Ambulatory Visit: Payer: Self-pay | Admitting: Internal Medicine

## 2014-07-01 DIAGNOSIS — R221 Localized swelling, mass and lump, neck: Secondary | ICD-10-CM

## 2014-07-01 DIAGNOSIS — R911 Solitary pulmonary nodule: Secondary | ICD-10-CM

## 2014-07-02 ENCOUNTER — Other Ambulatory Visit: Payer: BC Managed Care – PPO

## 2014-07-03 ENCOUNTER — Other Ambulatory Visit: Payer: BC Managed Care – PPO

## 2014-07-03 ENCOUNTER — Other Ambulatory Visit: Payer: Medicare Other

## 2014-07-03 ENCOUNTER — Ambulatory Visit
Admission: RE | Admit: 2014-07-03 | Discharge: 2014-07-03 | Disposition: A | Discharge status: Home / Self Care | Payer: Medicare Other | Source: Ambulatory Visit | Attending: Internal Medicine | Admitting: Internal Medicine

## 2014-07-03 DIAGNOSIS — R911 Solitary pulmonary nodule: Secondary | ICD-10-CM

## 2014-07-03 DIAGNOSIS — R221 Localized swelling, mass and lump, neck: Secondary | ICD-10-CM

## 2014-07-03 MED ORDER — IOPAMIDOL (ISOVUE-300) INJECTION 61%
75.0000 mL | Freq: Once | INTRAVENOUS | Status: AC | PRN
  Administered 2014-07-03: 75 mL via INTRAVENOUS

## 2014-07-04 ENCOUNTER — Inpatient Hospital Stay (HOSPITAL_COMMUNITY): Payer: BC Managed Care – PPO

## 2014-07-04 ENCOUNTER — Encounter (HOSPITAL_COMMUNITY): Payer: Self-pay | Admitting: Internal Medicine

## 2014-07-04 ENCOUNTER — Ambulatory Visit
Admission: RE | Admit: 2014-07-04 | Discharge: 2014-07-04 | Disposition: A | Discharge status: Home / Self Care | Payer: BC Managed Care – PPO | Source: Ambulatory Visit | Attending: Rehabilitation | Admitting: Rehabilitation

## 2014-07-04 ENCOUNTER — Inpatient Hospital Stay (HOSPITAL_COMMUNITY)
Admission: EM | Admit: 2014-07-04 | Discharge: 2014-07-18 | DRG: 987 | Disposition: A | Discharge status: Skilled Nursing Facility | Payer: BC Managed Care – PPO | Attending: Internal Medicine | Admitting: Internal Medicine

## 2014-07-04 DIAGNOSIS — R0602 Shortness of breath: Secondary | ICD-10-CM | POA: Diagnosis present

## 2014-07-04 DIAGNOSIS — I714 Abdominal aortic aneurysm, without rupture: Secondary | ICD-10-CM | POA: Diagnosis present

## 2014-07-04 DIAGNOSIS — R222 Localized swelling, mass and lump, trunk: Secondary | ICD-10-CM | POA: Diagnosis present

## 2014-07-04 DIAGNOSIS — Z888 Allergy status to other drugs, medicaments and biological substances status: Secondary | ICD-10-CM | POA: Diagnosis not present

## 2014-07-04 DIAGNOSIS — G893 Neoplasm related pain (acute) (chronic): Secondary | ICD-10-CM | POA: Diagnosis present

## 2014-07-04 DIAGNOSIS — S064X9A Epidural hemorrhage with loss of consciousness of unspecified duration, initial encounter: Secondary | ICD-10-CM | POA: Diagnosis present

## 2014-07-04 DIAGNOSIS — J690 Pneumonitis due to inhalation of food and vomit: Secondary | ICD-10-CM | POA: Diagnosis present

## 2014-07-04 DIAGNOSIS — E872 Acidosis: Secondary | ICD-10-CM | POA: Diagnosis not present

## 2014-07-04 DIAGNOSIS — Z833 Family history of diabetes mellitus: Secondary | ICD-10-CM | POA: Diagnosis not present

## 2014-07-04 DIAGNOSIS — J90 Pleural effusion, not elsewhere classified: Secondary | ICD-10-CM | POA: Diagnosis present

## 2014-07-04 DIAGNOSIS — M79604 Pain in right leg: Secondary | ICD-10-CM | POA: Diagnosis present

## 2014-07-04 DIAGNOSIS — R930 Abnormal findings on diagnostic imaging of skull and head, not elsewhere classified: Secondary | ICD-10-CM | POA: Diagnosis not present

## 2014-07-04 DIAGNOSIS — E049 Nontoxic goiter, unspecified: Secondary | ICD-10-CM | POA: Diagnosis present

## 2014-07-04 DIAGNOSIS — C3490 Malignant neoplasm of unspecified part of unspecified bronchus or lung: Secondary | ICD-10-CM | POA: Diagnosis present

## 2014-07-04 DIAGNOSIS — R042 Hemoptysis: Secondary | ICD-10-CM | POA: Diagnosis present

## 2014-07-04 DIAGNOSIS — R911 Solitary pulmonary nodule: Secondary | ICD-10-CM | POA: Diagnosis present

## 2014-07-04 DIAGNOSIS — C7800 Secondary malignant neoplasm of unspecified lung: Secondary | ICD-10-CM

## 2014-07-04 DIAGNOSIS — R0781 Pleurodynia: Secondary | ICD-10-CM | POA: Diagnosis not present

## 2014-07-04 DIAGNOSIS — I808 Phlebitis and thrombophlebitis of other sites: Secondary | ICD-10-CM | POA: Diagnosis not present

## 2014-07-04 DIAGNOSIS — I82B12 Acute embolism and thrombosis of left subclavian vein: Secondary | ICD-10-CM | POA: Diagnosis present

## 2014-07-04 DIAGNOSIS — I10 Essential (primary) hypertension: Secondary | ICD-10-CM | POA: Diagnosis present

## 2014-07-04 DIAGNOSIS — J189 Pneumonia, unspecified organism: Secondary | ICD-10-CM | POA: Clinically undetermined

## 2014-07-04 DIAGNOSIS — G9619 Other disorders of meninges, not elsewhere classified: Secondary | ICD-10-CM

## 2014-07-04 DIAGNOSIS — C7951 Secondary malignant neoplasm of bone: Secondary | ICD-10-CM | POA: Diagnosis present

## 2014-07-04 DIAGNOSIS — C770 Secondary and unspecified malignant neoplasm of lymph nodes of head, face and neck: Secondary | ICD-10-CM | POA: Diagnosis present

## 2014-07-04 DIAGNOSIS — Z820 Family history of epilepsy and other diseases of the nervous system: Secondary | ICD-10-CM | POA: Diagnosis not present

## 2014-07-04 DIAGNOSIS — E060 Acute thyroiditis: Secondary | ICD-10-CM | POA: Diagnosis present

## 2014-07-04 DIAGNOSIS — Z9181 History of falling: Secondary | ICD-10-CM

## 2014-07-04 DIAGNOSIS — E860 Dehydration: Secondary | ICD-10-CM | POA: Diagnosis present

## 2014-07-04 DIAGNOSIS — M4804 Spinal stenosis, thoracic region: Secondary | ICD-10-CM | POA: Diagnosis present

## 2014-07-04 DIAGNOSIS — S065X0D Traumatic subdural hemorrhage without loss of consciousness, subsequent encounter: Secondary | ICD-10-CM | POA: Diagnosis not present

## 2014-07-04 DIAGNOSIS — J432 Centrilobular emphysema: Secondary | ICD-10-CM | POA: Diagnosis not present

## 2014-07-04 DIAGNOSIS — Z823 Family history of stroke: Secondary | ICD-10-CM | POA: Diagnosis not present

## 2014-07-04 DIAGNOSIS — B379 Candidiasis, unspecified: Secondary | ICD-10-CM | POA: Diagnosis not present

## 2014-07-04 DIAGNOSIS — E43 Unspecified severe protein-calorie malnutrition: Secondary | ICD-10-CM | POA: Diagnosis present

## 2014-07-04 DIAGNOSIS — Z91013 Allergy to seafood: Secondary | ICD-10-CM

## 2014-07-04 DIAGNOSIS — R1310 Dysphagia, unspecified: Secondary | ICD-10-CM

## 2014-07-04 DIAGNOSIS — T380X5A Adverse effect of glucocorticoids and synthetic analogues, initial encounter: Secondary | ICD-10-CM | POA: Diagnosis present

## 2014-07-04 DIAGNOSIS — C800 Disseminated malignant neoplasm, unspecified: Secondary | ICD-10-CM

## 2014-07-04 DIAGNOSIS — R591 Generalized enlarged lymph nodes: Secondary | ICD-10-CM | POA: Diagnosis present

## 2014-07-04 DIAGNOSIS — C760 Malignant neoplasm of head, face and neck: Secondary | ICD-10-CM

## 2014-07-04 DIAGNOSIS — C801 Malignant (primary) neoplasm, unspecified: Secondary | ICD-10-CM | POA: Diagnosis not present

## 2014-07-04 DIAGNOSIS — M542 Cervicalgia: Secondary | ICD-10-CM

## 2014-07-04 DIAGNOSIS — M79609 Pain in unspecified limb: Secondary | ICD-10-CM

## 2014-07-04 DIAGNOSIS — D630 Anemia in neoplastic disease: Secondary | ICD-10-CM | POA: Diagnosis present

## 2014-07-04 DIAGNOSIS — I621 Nontraumatic extradural hemorrhage: Secondary | ICD-10-CM | POA: Diagnosis not present

## 2014-07-04 DIAGNOSIS — J9601 Acute respiratory failure with hypoxia: Secondary | ICD-10-CM | POA: Diagnosis not present

## 2014-07-04 DIAGNOSIS — K088 Other specified disorders of teeth and supporting structures: Secondary | ICD-10-CM

## 2014-07-04 DIAGNOSIS — M069 Rheumatoid arthritis, unspecified: Secondary | ICD-10-CM | POA: Diagnosis present

## 2014-07-04 DIAGNOSIS — G4700 Insomnia, unspecified: Secondary | ICD-10-CM | POA: Diagnosis present

## 2014-07-04 DIAGNOSIS — D72829 Elevated white blood cell count, unspecified: Secondary | ICD-10-CM | POA: Diagnosis present

## 2014-07-04 DIAGNOSIS — R490 Dysphonia: Secondary | ICD-10-CM | POA: Diagnosis present

## 2014-07-04 DIAGNOSIS — D649 Anemia, unspecified: Secondary | ICD-10-CM | POA: Diagnosis present

## 2014-07-04 DIAGNOSIS — Z791 Long term (current) use of non-steroidal anti-inflammatories (NSAID): Secondary | ICD-10-CM | POA: Diagnosis not present

## 2014-07-04 DIAGNOSIS — R05 Cough: Secondary | ICD-10-CM

## 2014-07-04 DIAGNOSIS — Z79899 Other long term (current) drug therapy: Secondary | ICD-10-CM

## 2014-07-04 DIAGNOSIS — I150 Renovascular hypertension: Secondary | ICD-10-CM

## 2014-07-04 DIAGNOSIS — N400 Enlarged prostate without lower urinary tract symptoms: Secondary | ICD-10-CM | POA: Diagnosis present

## 2014-07-04 DIAGNOSIS — R29898 Other symptoms and signs involving the musculoskeletal system: Secondary | ICD-10-CM

## 2014-07-04 DIAGNOSIS — S065X9A Traumatic subdural hemorrhage with loss of consciousness of unspecified duration, initial encounter: Secondary | ICD-10-CM | POA: Diagnosis present

## 2014-07-04 DIAGNOSIS — K029 Dental caries, unspecified: Secondary | ICD-10-CM | POA: Diagnosis present

## 2014-07-04 DIAGNOSIS — R946 Abnormal results of thyroid function studies: Secondary | ICD-10-CM | POA: Diagnosis not present

## 2014-07-04 DIAGNOSIS — Z79891 Long term (current) use of opiate analgesic: Secondary | ICD-10-CM

## 2014-07-04 DIAGNOSIS — J9811 Atelectasis: Secondary | ICD-10-CM | POA: Diagnosis present

## 2014-07-04 DIAGNOSIS — I82890 Acute embolism and thrombosis of other specified veins: Secondary | ICD-10-CM

## 2014-07-04 DIAGNOSIS — Z8249 Family history of ischemic heart disease and other diseases of the circulatory system: Secondary | ICD-10-CM

## 2014-07-04 DIAGNOSIS — M899 Disorder of bone, unspecified: Secondary | ICD-10-CM | POA: Diagnosis present

## 2014-07-04 DIAGNOSIS — E876 Hypokalemia: Secondary | ICD-10-CM | POA: Diagnosis not present

## 2014-07-04 DIAGNOSIS — E0590 Thyrotoxicosis, unspecified without thyrotoxic crisis or storm: Secondary | ICD-10-CM | POA: Diagnosis present

## 2014-07-04 DIAGNOSIS — Z87891 Personal history of nicotine dependence: Secondary | ICD-10-CM

## 2014-07-04 DIAGNOSIS — K089 Disorder of teeth and supporting structures, unspecified: Secondary | ICD-10-CM | POA: Diagnosis present

## 2014-07-04 DIAGNOSIS — M1990 Unspecified osteoarthritis, unspecified site: Secondary | ICD-10-CM | POA: Diagnosis present

## 2014-07-04 DIAGNOSIS — R531 Weakness: Secondary | ICD-10-CM

## 2014-07-04 DIAGNOSIS — Y95 Nosocomial condition: Secondary | ICD-10-CM | POA: Diagnosis not present

## 2014-07-04 DIAGNOSIS — J9600 Acute respiratory failure, unspecified whether with hypoxia or hypercapnia: Secondary | ICD-10-CM | POA: Diagnosis present

## 2014-07-04 DIAGNOSIS — G96198 Other disorders of meninges, not elsewhere classified: Secondary | ICD-10-CM

## 2014-07-04 DIAGNOSIS — J449 Chronic obstructive pulmonary disease, unspecified: Secondary | ICD-10-CM | POA: Diagnosis present

## 2014-07-04 DIAGNOSIS — R0902 Hypoxemia: Secondary | ICD-10-CM

## 2014-07-04 DIAGNOSIS — E079 Disorder of thyroid, unspecified: Secondary | ICD-10-CM | POA: Diagnosis not present

## 2014-07-04 DIAGNOSIS — M5136 Other intervertebral disc degeneration, lumbar region: Secondary | ICD-10-CM

## 2014-07-04 DIAGNOSIS — S065XAA Traumatic subdural hemorrhage with loss of consciousness status unknown, initial encounter: Secondary | ICD-10-CM | POA: Clinically undetermined

## 2014-07-04 DIAGNOSIS — C799 Secondary malignant neoplasm of unspecified site: Secondary | ICD-10-CM | POA: Diagnosis present

## 2014-07-04 DIAGNOSIS — R0600 Dyspnea, unspecified: Secondary | ICD-10-CM

## 2014-07-04 DIAGNOSIS — R221 Localized swelling, mass and lump, neck: Secondary | ICD-10-CM | POA: Diagnosis present

## 2014-07-04 DIAGNOSIS — R059 Cough, unspecified: Secondary | ICD-10-CM

## 2014-07-04 LAB — CBC
HCT: 30.3 % — ABNORMAL LOW (ref 39.0–52.0)
HEMOGLOBIN: 10.2 g/dL — AB (ref 13.0–17.0)
MCH: 31.9 pg (ref 26.0–34.0)
MCHC: 33.7 g/dL (ref 30.0–36.0)
MCV: 94.7 fL (ref 78.0–100.0)
PLATELETS: 152 10*3/uL (ref 150–400)
RBC: 3.2 MIL/uL — ABNORMAL LOW (ref 4.22–5.81)
RDW: 13.8 % (ref 11.5–15.5)
WBC: 20.5 10*3/uL — AB (ref 4.0–10.5)

## 2014-07-04 LAB — CBC WITH DIFFERENTIAL/PLATELET
BASOS PCT: 0 % (ref 0–1)
Basophils Absolute: 0.1 10*3/uL (ref 0.0–0.1)
EOS ABS: 1 10*3/uL — AB (ref 0.0–0.7)
Eosinophils Relative: 5 % (ref 0–5)
HEMATOCRIT: 31.4 % — AB (ref 39.0–52.0)
Hemoglobin: 10.4 g/dL — ABNORMAL LOW (ref 13.0–17.0)
LYMPHS ABS: 1.8 10*3/uL (ref 0.7–4.0)
LYMPHS PCT: 9 % — AB (ref 12–46)
MCH: 31.4 pg (ref 26.0–34.0)
MCHC: 33.1 g/dL (ref 30.0–36.0)
MCV: 94.9 fL (ref 78.0–100.0)
MONO ABS: 2.1 10*3/uL — AB (ref 0.1–1.0)
MONOS PCT: 11 % (ref 3–12)
NEUTROS ABS: 15 10*3/uL — AB (ref 1.7–7.7)
Neutrophils Relative %: 75 % (ref 43–77)
Platelets: 162 10*3/uL (ref 150–400)
RBC: 3.31 MIL/uL — AB (ref 4.22–5.81)
RDW: 13.9 % (ref 11.5–15.5)
WBC: 20 10*3/uL — AB (ref 4.0–10.5)

## 2014-07-04 LAB — COMPREHENSIVE METABOLIC PANEL
ALBUMIN: 3.2 g/dL — AB (ref 3.5–5.0)
ALK PHOS: 66 U/L (ref 38–126)
ALT: 32 U/L (ref 17–63)
AST: 34 U/L (ref 15–41)
Anion gap: 9 (ref 5–15)
BILIRUBIN TOTAL: 1.1 mg/dL (ref 0.3–1.2)
BUN: 32 mg/dL — ABNORMAL HIGH (ref 6–20)
CHLORIDE: 105 mmol/L (ref 101–111)
CO2: 21 mmol/L — ABNORMAL LOW (ref 22–32)
Calcium: 9.1 mg/dL (ref 8.9–10.3)
Creatinine, Ser: 0.51 mg/dL — ABNORMAL LOW (ref 0.61–1.24)
Glucose, Bld: 99 mg/dL (ref 70–99)
Potassium: 4.6 mmol/L (ref 3.5–5.1)
Sodium: 135 mmol/L (ref 135–145)
Total Protein: 5.8 g/dL — ABNORMAL LOW (ref 6.5–8.1)

## 2014-07-04 LAB — CREATININE, SERUM
CREATININE: 0.56 mg/dL — AB (ref 0.61–1.24)
GFR calc non Af Amer: >60 mL/min (ref >60)

## 2014-07-04 LAB — TSH: TSH: 0.016 u[IU]/mL — AB (ref 0.350–4.500)

## 2014-07-04 LAB — URINE MICROSCOPIC-ADD ON

## 2014-07-04 LAB — IRON AND TIBC
Iron: 34 ug/dL — ABNORMAL LOW (ref 45–182)
Saturation Ratios: 16 % — ABNORMAL LOW (ref 17.9–39.5)
TIBC: 216 ug/dL — ABNORMAL LOW (ref 250–450)
UIBC: 182 ug/dL

## 2014-07-04 LAB — URINALYSIS, ROUTINE W REFLEX MICROSCOPIC
BILIRUBIN URINE: NEGATIVE
GLUCOSE, UA: NEGATIVE mg/dL
KETONES UR: 40 mg/dL — AB
LEUKOCYTES UA: NEGATIVE
NITRITE: NEGATIVE
PH: 5.5 (ref 5.0–8.0)
Protein, ur: 30 mg/dL — AB
Specific Gravity, Urine: 1.031 — ABNORMAL HIGH (ref 1.005–1.030)
Urobilinogen, UA: 0.2 mg/dL (ref 0.0–1.0)

## 2014-07-04 LAB — RETICULOCYTES
RBC.: 3.2 MIL/uL — ABNORMAL LOW (ref 4.22–5.81)
RETIC CT PCT: 3.5 % — AB (ref 0.4–3.1)
Retic Count, Absolute: 112 10*3/uL (ref 19.0–186.0)

## 2014-07-04 LAB — FERRITIN: Ferritin: 824 ng/mL — ABNORMAL HIGH (ref 24–336)

## 2014-07-04 LAB — FOLATE: FOLATE: 27 ng/mL (ref >5.9)

## 2014-07-04 LAB — T4, FREE: FREE T4: 5.65 ng/dL — AB (ref 0.61–1.12)

## 2014-07-04 LAB — PROTIME-INR
INR: 1.39 (ref 0.00–1.49)
PROTHROMBIN TIME: 17.2 s — AB (ref 11.6–15.2)

## 2014-07-04 LAB — MAGNESIUM: Magnesium: 2.2 mg/dL (ref 1.7–2.4)

## 2014-07-04 LAB — MRSA PCR SCREENING: MRSA by PCR: NEGATIVE

## 2014-07-04 LAB — VITAMIN B12: VITAMIN B 12: 1130 pg/mL — AB (ref 180–914)

## 2014-07-04 MED ORDER — ONDANSETRON HCL 4 MG/2ML IJ SOLN
4.0000 mg | Freq: Four times a day (QID) | INTRAMUSCULAR | Status: DC | PRN
  Administered 2014-07-04 – 2014-07-06 (×7): 4 mg via INTRAVENOUS
  Filled 2014-07-04 (×8): qty 2

## 2014-07-04 MED ORDER — DOCUSATE SODIUM 100 MG PO CAPS
100.0000 mg | ORAL_CAPSULE | Freq: Two times a day (BID) | ORAL | Status: DC
  Administered 2014-07-04 – 2014-07-07 (×6): 100 mg via ORAL
  Filled 2014-07-04 (×7): qty 1

## 2014-07-04 MED ORDER — TIOTROPIUM BROMIDE MONOHYDRATE 18 MCG IN CAPS
18.0000 ug | ORAL_CAPSULE | RESPIRATORY_TRACT | Status: DC
  Filled 2014-07-04: qty 5

## 2014-07-04 MED ORDER — TRAMADOL HCL 50 MG PO TABS
50.0000 mg | ORAL_TABLET | Freq: Four times a day (QID) | ORAL | Status: DC | PRN
  Administered 2014-07-04 – 2014-07-11 (×3): 50 mg via ORAL
  Filled 2014-07-04 (×3): qty 1

## 2014-07-04 MED ORDER — ENOXAPARIN SODIUM 40 MG/0.4ML ~~LOC~~ SOLN: 40.0000 mg | SUBCUTANEOUS | Status: DC

## 2014-07-04 MED ORDER — ENOXAPARIN SODIUM 60 MG/0.6ML ~~LOC~~ SOLN
1.0000 mg/kg | Freq: Two times a day (BID) | SUBCUTANEOUS | Status: DC
  Administered 2014-07-04 – 2014-07-06 (×4): 60 mg via SUBCUTANEOUS
  Filled 2014-07-04 (×6): qty 0.6

## 2014-07-04 MED ORDER — IBUPROFEN 200 MG PO TABS: 200.0000 mg | ORAL_TABLET | ORAL | Status: DC | PRN

## 2014-07-04 MED ORDER — IPRATROPIUM BROMIDE 0.02 % IN SOLN: 0.5000 mg | RESPIRATORY_TRACT | Status: DC | PRN

## 2014-07-04 MED ORDER — POLYETHYLENE GLYCOL 3350 17 G PO PACK
17.0000 g | PACK | Freq: Every day | ORAL | Status: DC | PRN
Start: 2014-07-04 — End: 2014-07-18
  Filled 2014-07-04: qty 1

## 2014-07-04 MED ORDER — TEMAZEPAM 15 MG PO CAPS: 15.0000 mg | ORAL_CAPSULE | Freq: Every evening | ORAL | Status: DC | PRN

## 2014-07-04 MED ORDER — BUDESONIDE-FORMOTEROL FUMARATE 160-4.5 MCG/ACT IN AERO
2.0000 | INHALATION_SPRAY | Freq: Two times a day (BID) | RESPIRATORY_TRACT | Status: DC
  Administered 2014-07-04 – 2014-07-18 (×28): 2 via RESPIRATORY_TRACT
  Filled 2014-07-04 (×3): qty 6

## 2014-07-04 MED ORDER — ZOLEDRONIC ACID 4 MG/5ML IV CONC: 4.0000 mg | Freq: Once | INTRAVENOUS | Status: DC

## 2014-07-04 MED ORDER — MAGNESIUM CITRATE PO SOLN: 1.0000 | Freq: Once | ORAL | Status: AC | PRN

## 2014-07-04 MED ORDER — ONDANSETRON HCL 4 MG PO TABS: 4.0000 mg | ORAL_TABLET | Freq: Four times a day (QID) | ORAL | Status: DC | PRN

## 2014-07-04 MED ORDER — BENAZEPRIL HCL 10 MG PO TABS
10.0000 mg | ORAL_TABLET | Freq: Every day | ORAL | Status: DC
  Filled 2014-07-04: qty 1

## 2014-07-04 MED ORDER — HYDRALAZINE HCL 20 MG/ML IJ SOLN
10.0000 mg | Freq: Four times a day (QID) | INTRAMUSCULAR | Status: DC | PRN
  Administered 2014-07-04 – 2014-07-05 (×2): 10 mg via INTRAVENOUS
  Filled 2014-07-04 (×3): qty 1

## 2014-07-04 MED ORDER — KETOROLAC TROMETHAMINE 30 MG/ML IJ SOLN
30.0000 mg | Freq: Four times a day (QID) | INTRAMUSCULAR | Status: AC | PRN
  Administered 2014-07-04 – 2014-07-09 (×10): 30 mg via INTRAVENOUS
  Filled 2014-07-04 (×11): qty 1

## 2014-07-04 MED ORDER — ALBUTEROL SULFATE (2.5 MG/3ML) 0.083% IN NEBU: 2.5000 mg | INHALATION_SOLUTION | RESPIRATORY_TRACT | Status: DC | PRN

## 2014-07-04 MED ORDER — PAMIDRONATE DISODIUM 90 MG/10ML IV SOLN
90.0000 mg | Freq: Once | INTRAVENOUS | Status: AC
  Administered 2014-07-05: 90 mg via INTRAVENOUS
  Filled 2014-07-04: qty 10

## 2014-07-04 MED ORDER — ACETAMINOPHEN 650 MG RE SUPP: 650.0000 mg | Freq: Four times a day (QID) | RECTAL | Status: DC | PRN

## 2014-07-04 MED ORDER — OXYCODONE HCL 5 MG PO TABS
5.0000 mg | ORAL_TABLET | ORAL | Status: DC | PRN
  Administered 2014-07-04 – 2014-07-06 (×5): 5 mg via ORAL
  Filled 2014-07-04 (×6): qty 1

## 2014-07-04 MED ORDER — DEXAMETHASONE SODIUM PHOSPHATE 10 MG/ML IJ SOLN
20.0000 mg | INTRAMUSCULAR | Status: DC
  Administered 2014-07-04 – 2014-07-08 (×5): 20 mg via INTRAVENOUS
  Filled 2014-07-04 (×5): qty 2

## 2014-07-04 MED ORDER — DEXAMETHASONE SODIUM PHOSPHATE 4 MG/ML IJ SOLN
20.0000 mg | INTRAMUSCULAR | Status: DC
  Filled 2014-07-04: qty 5

## 2014-07-04 MED ORDER — SODIUM CHLORIDE 0.9 % IV BOLUS (SEPSIS)
500.0000 mL | Freq: Once | INTRAVENOUS | Status: AC
  Administered 2014-07-04: 500 mL via INTRAVENOUS

## 2014-07-04 MED ORDER — SODIUM CHLORIDE 0.9 % IJ SOLN
3.0000 mL | Freq: Two times a day (BID) | INTRAMUSCULAR | Status: DC
  Administered 2014-07-04 – 2014-07-15 (×13): 3 mL via INTRAVENOUS

## 2014-07-04 MED ORDER — ENOXAPARIN SODIUM 40 MG/0.4ML ~~LOC~~ SOLN: 40.0000 mg | Freq: Two times a day (BID) | SUBCUTANEOUS | Status: DC

## 2014-07-04 MED ORDER — SODIUM CHLORIDE 0.9 % IV SOLN: INTRAVENOUS | Status: DC

## 2014-07-04 MED ORDER — SODIUM CHLORIDE 0.9 % IV SOLN
INTRAVENOUS | Status: DC
  Administered 2014-07-05 – 2014-07-13 (×7): via INTRAVENOUS

## 2014-07-04 MED ORDER — SORBITOL 70 % SOLN
30.0000 mL | Freq: Every day | Status: DC | PRN
  Filled 2014-07-04: qty 30

## 2014-07-04 MED ORDER — SODIUM CHLORIDE 0.9 % IV SOLN
INTRAVENOUS | Status: DC
  Administered 2014-07-04: 15:00:00 via INTRAVENOUS
  Administered 2014-07-05: 75 mL via INTRAVENOUS

## 2014-07-04 MED ORDER — METOPROLOL SUCCINATE ER 25 MG PO TB24
25.0000 mg | ORAL_TABLET | Freq: Every day | ORAL | Status: DC
  Administered 2014-07-04: 25 mg via ORAL
  Filled 2014-07-04: qty 1

## 2014-07-04 MED ORDER — ALUM & MAG HYDROXIDE-SIMETH 200-200-20 MG/5ML PO SUSP
30.0000 mL | Freq: Four times a day (QID) | ORAL | Status: DC | PRN
  Administered 2014-07-05: 30 mL via ORAL
  Filled 2014-07-04: qty 30

## 2014-07-04 MED ORDER — LIDOCAINE VISCOUS 2 % MT SOLN
15.0000 mL | OROMUCOSAL | Status: DC | PRN
  Administered 2014-07-04: 15 mL via OROMUCOSAL
  Filled 2014-07-04 (×2): qty 15

## 2014-07-04 MED ORDER — TEMAZEPAM 15 MG PO CAPS
15.0000 mg | ORAL_CAPSULE | Freq: Every evening | ORAL | Status: DC | PRN
  Administered 2014-07-04: 15 mg via ORAL
  Administered 2014-07-05: 30 mg via ORAL
  Administered 2014-07-11 – 2014-07-14 (×4): 15 mg via ORAL
  Administered 2014-07-15 – 2014-07-16 (×2): 30 mg via ORAL
  Administered 2014-07-17: 15 mg via ORAL
  Filled 2014-07-04 (×3): qty 1
  Filled 2014-07-04 (×2): qty 2
  Filled 2014-07-04 (×3): qty 1
  Filled 2014-07-04: qty 2

## 2014-07-04 MED ORDER — ACETAMINOPHEN 325 MG PO TABS: 650.0000 mg | ORAL_TABLET | Freq: Four times a day (QID) | ORAL | Status: DC | PRN

## 2014-07-04 NOTE — H&P (Signed)
[facility] History and Physical  SHAAN RHOADS IWL:[id number] DOB: [birth year] DOA: 07/04/2014  Referring physician: Dr. Vanita Panda PCP: [name], MD   Chief Complaint: Chest pain/dysphagia/abnormal CT scan  HPI: [name] [name] is a 74 y.o. male  With history of lung nodule, hypertension, AAA status post endovascular stent graft 12/11/2013 per Dr. Donnetta Hutching, history of rheumatoid arthritis who presents to the ED after being called by his PCPs office secondary to abnormal CT scan. Patient with complaints of a 3 month history of chest pain, bilateral rib pain left greater than right, weakness, neck swelling which has worsened over the past 3 weeks to the point where he sometimes has difficulty breathing and problems swallowing. Patient also with complaints of a 3 to four-month history of bilateral hip pain with right lower extremity weakness and numbness. Patient does endorse a 6 day history of hemoptysis. Patient also with a six-month period of a 10 pound weight loss. Patient denies any fever, no chills, no emesis, no abdominal pain, no constipation, no diarrhea, no dysuria, no bowel or bladder incontinence, no melena, no hematochezia, no hematemesis. Patient does endorse decreased oral intake and decreased appetite over the past month. Patient states recently he's been able to eat is a piece of bread and a cup of milk daily. 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 Acute suppurative thyroiditis is also a concentration. Thrombus was noted at the junction of the left subclavian vein and left brachiocephalic vein. Markedly carious dentition with ostial lysis in the right mandibular body which may be old on total genic or metastatic. CT chest which was done showed coarse reticular  opacities with intervening hazy groundglass opacity and associated edematous cystic change in the right upper lobe could be chronic versus acute infectious or inflammatory infiltrate was possible. No discrete pulmonary mass. 4 mm pleural-based nodule noted in the peripheral right upper lobe. No other suspicious nodules. Right greater than left pleural effusions small with dependent atelectasis no pulmonary edema. Lytic lesions involving 3 discrete ribs. Left fifth rib also has associated soft tissue mass highly suspicious for metastatic disease to the bone. Neck base abnormalities as per prior CT. MRI of the L-spine which was done showed findings consistent with multifocal metastatic disease. Extensive epidural and right paraspinous tumor also present resulting in central canal narrowing and encroachment on both descending and exiting nerve roots was notable affected levels L3-L5-S1 level. Comprehensive metabolic profile obtained at a bicarbonate of 21 BUN of 32 creatinine of 0.5 one albumin of 3.2 protein of 5.8 otherwise was within normal limits. CBC done had a white count of 20 hemoglobin of 10.4 otherwise was within normal limits. INR was 1.39. Urinalysis done was nitrite negative leukocytes negative. ED physician spoke with Dr. Erik Obey of ENT as well as Dr. Marin Olp, oncology who recommended patient be admitted on the the hospitalist service and they will consult.   Review of Systems: As per history of present illness otherwise negative. Constitutional:  No weight loss, night sweats, Fevers, chills, fatigue.  HEENT:  No headaches, Difficulty swallowing,Tooth/dental problems,Sore throat,  No sneezing, itching, ear ache, nasal congestion, post nasal drip,  Cardio-vascular:  No chest pain, Orthopnea, PND, swelling in lower extremities, anasarca, dizziness, palpitations  GI:  No heartburn, indigestion, abdominal pain, nausea, vomiting, diarrhea, change in bowel habits, loss of appetite  Resp:  No  shortness of breath with exertion or at rest. No excess mucus, no productive cough, No non-productive cough, No coughing up of blood.No change in color of mucus.No wheezing.No chest wall deformity  Skin:  no rash or lesions.  GU:  no dysuria, change in color of urine, no urgency or frequency. No flank pain.  Musculoskeletal:  No joint pain or swelling. No decreased range of motion. No back pain.  Psych:  No change in mood or affect. No depression or anxiety. No memory loss.   Past Medical History  Diagnosis Date  . Lung nodule 2012  . Insomnia     takes Restoril nightly as needed  . Hypertension     takes Benazepril and Metoprolol daily  . Pneumonia     hx of;many yrs ago  . AAA (abdominal aortic aneurysm)   . Joint pain   . Joint swelling   . Urinary frequency     takes Terazosin daily  . Urinary urgency   . Nocturia   . Enlarged prostate     slightly  . Rheumatoid arthritis(714.0)     takes Plaquenil daily and Methotrexate weekly   Past Surgical History  Procedure Laterality Date  . Inguinal hernia repair  2008  . Appendectomy    . Abdominal aortic endovascular stent graft Bilateral 12/11/2013    Procedure: ABDOMINAL AORTIC ENDOVASCULAR STENT GRAFT; left groin exploration;  Surgeon: Rosetta Posner, MD;  Location: [facility] OR;  Service: Vascular;  Laterality: Bilateral;   Social History:  reports that he has quit smoking. His smoking use included Cigarettes. He has never used smokeless tobacco. He reports that he does not drink alcohol or use illicit drugs.  Allergies  Allergen Reactions  . Other     Catfish + IV pain medication (unknown name)-vomits    Family History  Problem Relation Age of Onset  . Hypertension Father   . Stroke Father   . Hypertension Mother   . Diabetes Mother   . Hyperlipidemia Mother    Father deceased age 19 from an acute CVA. Mother deceased age 52 with Alzheimer's dementia and diabetes.  Prior to Admission medications   Medication Sig Start  Date End Date Taking? Authorizing Provider  benazepril (LOTENSIN) 10 MG tablet Take 10 mg by mouth daily.   Yes Historical Provider, MD  Calcium 600-200 MG-UNIT per tablet Take 1 tablet by mouth daily.    Yes Historical Provider, MD  Cholecalciferol (D 2000) 2000 UNITS TABS Take 1 tablet by mouth daily.    Yes Historical Provider, MD  fish oil-omega-3 fatty acids 1000 MG capsule Take 1 g by mouth daily.   Yes Historical Provider, MD  folic acid (FOLVITE) 1 MG tablet Take 1 mg by mouth daily.   Yes Historical Provider, MD  hydroxychloroquine (PLAQUENIL) 200 MG tablet Take 200 mg by mouth 2 (two) times daily.   Yes Historical Provider, MD  ibuprofen (ADVIL,MOTRIN) 200 MG tablet Take 200 mg by mouth every 4 (four) hours as needed for moderate pain.   Yes Historical Provider, MD  methotrexate 2.5 MG tablet Take 17.5 mg by mouth every 7 (seven) days. Pt states he either takes it on Friday or Saturday.   Yes Historical Provider, MD  metoprolol succinate (TOPROL-XL) 25 MG 24 hr tablet Take 25 mg by mouth daily.   Yes Historical Provider, MD  Misc Natural Products (OSTEO BI-FLEX JOINT SHIELD) TABS Take 1 tablet by mouth daily.    Yes Historical Provider, MD  Multiple  Vitamin (MULTIVITAMIN) tablet Take 1 tablet by mouth daily.   Yes Historical Provider, MD  vitamin E (VITAMIN E) 400 UNIT capsule Take 400 Units by mouth 2 (two) times daily.    Yes Historical Provider, MD  zolpidem (AMBIEN) 10 MG tablet Take 10 mg by mouth at bedtime as needed for sleep.   Yes Historical Provider, MD  doxycycline (VIBRA-TABS) 100 MG tablet  11/27/13   Historical Provider, MD  temazepam (RESTORIL) 15 MG capsule Take 1 capsule (15 mg total) by mouth at bedtime as needed for sleep. 02/28/11 03/30/11  Neena Rhymes, MD  traMADol (ULTRAM) 50 MG tablet Take 1 tablet (50 mg total) by mouth every 6 (six) hours as needed for moderate pain. Patient not taking: Reported on 07/04/2014 12/12/13   Gabriel Earing, PA-C   Physical  Exam: Filed Vitals:   07/04/14 0933 07/04/14 1200 07/04/14 1243  BP: 171/81 176/78   Pulse: 98 99   Temp:   100 F (37.8 C)  TempSrc:   Rectal  Resp: 20 22   SpO2: 94% 92%     Wt Readings from Last 3 Encounters:  01/14/14 66.769 kg (147 lb 3.2 oz)  12/11/13 66.8 kg (147 lb 4.3 oz)  12/05/13 67.178 kg (148 lb 1.6 oz)    General:  Ill appearing gentleman sitting on gurney in no acute cardiopulmonary distress. Speaking in full sentences. Eyes: PERRLA, EOMI, normal lids, irises & conjunctiva ENT: grossly normal hearing, lips & tongue. Poor dentition Neck: Firm mass in the lower throat nontender noted. Cardiovascular: Tachycardic, no m/r/g. No LE edema. Telemetry: Sinus tachycardia Respiratory: CTA bilaterally, no w/r/r. Normal respiratory effort. Abdomen: soft, ntnd, positive bowel sounds, no rebound, no guarding Skin: no rash or induration seen on limited exam Musculoskeletal: 5 out of 5 bilateral upper extremity strength.3-4/5 right lower extremity strength. 5 out of 5 left lower extremity strength. Psychiatric: grossly normal mood and affect, speech fluent and appropriate Neurologic: Alert and oriented 3. Cranial nerves II through XII are grossly intact. Sensation is intact. Unable to illicit reflexes symmetrically and diffusely. Visual fields intact. Gait not tested secondary to safety.           Labs on Admission:  Basic Metabolic Panel:  Recent Labs Lab 07/04/14 1154  NA 135  K 4.6  CL 105  CO2 21*  GLUCOSE 99  BUN 32*  CREATININE 0.51*  CALCIUM 9.1   Liver Function Tests:  Recent Labs Lab 07/04/14 1154  AST 34  ALT 32  ALKPHOS 66  BILITOT 1.1  PROT 5.8*  ALBUMIN 3.2*   No results for input(s): LIPASE, AMYLASE in the last 168 hours. No results for input(s): AMMONIA in the last 168 hours. CBC:  Recent Labs Lab 07/04/14 1154  WBC 20.0*  NEUTROABS 15.0*  HGB 10.4*  HCT 31.4*  MCV 94.9  PLT 162   Cardiac Enzymes: No results for input(s):  CKTOTAL, CKMB, CKMBINDEX, TROPONINI in the last 168 hours.  BNP (last 3 results) No results for input(s): BNP in the last 8760 hours.  ProBNP (last 3 results) No results for input(s): PROBNP in the last 8760 hours.  CBG: No results for input(s): GLUCAP in the last 168 hours.  Radiological Exams on Admission: Ct Soft Tissue Neck W Contrast  07/03/2014   CLINICAL DATA:  Neck mass for 2 weeks. Increasing size of neck mass.  EXAM: CT NECK WITH CONTRAST  TECHNIQUE: Multidetector CT imaging of the neck was performed using the standard protocol following the bolus  administration of intravenous contrast.  CONTRAST:  75 mL Isovue-300  COMPARISON:  PET-CT 08/17/2011.  Chest CT today.  FINDINGS: Pharynx and larynx: Cystic lesion is present in the body of the RIGHT mandible which may be odontogenic or represent metastatic disease. Oropharynx and nasopharynx appears patent.  Salivary glands: Parotid glands are within normal limits. Submandibular glands also appear within normal limits.  Thyroid: Massively enlarged thyroid gland with diffuse phlegmon around the gland. There is low attenuation in the central LEFT thyroid lobe that measures about 3.3 x 2.6 cm. This probably represents metastatic disease in a patient with primary malignancy.  Lymph nodes: Innumerable necrotic lymph nodes are present in the LEFT greater than RIGHT level 2 and level 3 nodal stations extending into the chest.  Vascular: Carotid atherosclerosis. Diminutive LEFT internal jugular vein. There is a large thrombus in LEFT subclavian vein that extends to the margin of the LEFT brachiocephalic vein.  Limited intracranial: No gross acute abnormality.  Mastoids and visualized paranasal sinuses: Mastoid air cells are clear. Mucous retention cyst/ polyp in the LEFT maxillary sinus.  Skeleton: Cervical spine degenerative disease. No pathologic fractures.  Upper chest: Deferred to chest CT.  IMPRESSION: 1. Massive thyroid gland enlargement with adjacent  phlegmon. LEFT-greater-than- RIGHT necrotic cervical lymphadenopathy. 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 Acute suppurative thyroiditis is in the differential considerations. 2. 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 3. Markedly carious dentition with osteolysis in the RIGHT mandibular body. This may be odontogenic or metastatic.   Electronically Signed   By: Dereck Ligas M.D.   On: 07/03/2014 16:56   Ct Chest Wo Contrast  07/03/2014   CLINICAL DATA:  F/u lung nodule pain left side with inspirationSob former smokerNo surgNo hx caOld fx'd ribs  EXAM: CT CHEST WITHOUT CONTRAST  TECHNIQUE: Multidetector CT imaging of the chest was performed following the standard protocol without IV contrast.  COMPARISON:  CT, 01/07/2014.  Chest radiograph, 12/11/2013.  FINDINGS: Thoracic inlet: There is heterogeneous soft tissue surrounding the lower trachea consistent with heterogeneous enlargement of the thyroid gland. Margins of the gland are ill-defined. There are multiple prominent neck base lymph nodes the largest 1 cm in short axis. Partly imaged larynx shows some increased soft tissue in mild narrowing of the airway.  Mediastinum and hila: Heart is normal in size. There are mild to moderate coronary artery calcifications. There is dilation of the ascending aorta measuring 4.6 cm x 4.5 cm in diameter. The aorta is mildly dilated along the arch and proximal descending portion. There are diffuse atherosclerotic calcifications throughout the thoracic aorta.  The multiple small mediastinal lymph nodes. There are no pathologically enlarged lymph nodes. No mediastinal masses. No hilar masses or adenopathy.  Lungs and pleura: There are small, right greater than left, bilateral pleural effusions. There are coarse reticular opacities in the right upper  lobe with intervening emphysematous cystic spaces and some intervening hazy ground-glass type opacity. 4 mm noncalcified pleural-based nodule noted in the right upper lobe, image 30, series 4. Calcified granuloma is noted in the right lower lobe peripherally. There is mild atelectasis adjacent to the pleural effusions in both lower lobes there is no pulmonary edema. No pneumothorax. Mild emphysema is noted in the left upper lobe.  Limited upper abdomen: Dense upper abdominal aortic calcification and partly imaged proximal aspect of an aortic stent. Visualized portions of  the liver, spleen, gallbladder and pancreas are unremarkable. No adrenal masses.  Musculoskeletal: There are lytic process cysts involving 3 discrete ribs, of the right posterior fourth rib, right posterior and lateral seventh rib and the left anterior and lateral fifth rib. The soft tissue mass surrounds the lytic process of the left antral lateral fifth rib. No other lytic lesions.  IMPRESSION: 1. There are coarse reticular opacities with intervening hazy ground-glass opacity and associated if edematous cystic change in the right upper lobe. This could all be chronic. Acute infectious or inflammatory infiltrate is possible. 2. No discrete pulmonary mass. 4 mm pleural-based nodule noted in the peripheral right upper lobe. No other suspicious nodules 3. Small, right greater than left, pleural effusions with associated dependent atelectasis. No pulmonary edema. 4. Neck base abnormalities. Patient also underwent neck CT. There is ill-defined soft tissue that involves and surrounds the thyroid gland. The thyroid appears enlarged and heterogeneous. There are multiple prominent neck base lymph nodes. 5. There are lytic lesions involving 3 discrete ribs. The left fifth rib also has associated soft tissue mass. This is highly suspicious for metastatic disease to bone.   Electronically Signed   By: Lajean Manes M.D.   On: 07/03/2014 16:39   Mr Lumbar  Spine Wo Contrast  07/04/2014   CLINICAL DATA:  Severe low back pain radiating into the right hip and leg. Symptoms for 1 year.  EXAM: MRI LUMBAR SPINE WITHOUT CONTRAST  TECHNIQUE: Multiplanar, multisequence MR imaging of the lumbar spine was performed. No intravenous contrast was administered.  COMPARISON:  CT abdomen and pelvis 01/07/2014. PET CT scan 08/17/2011.  FINDINGS: Multiple foci of abnormal marrow signal are identified post consistent with metastatic disease or less likely multiple myeloma. L4 is completely replaced by tumor. A large lesion is also identified and L5 eccentric to the right extending into the right pedicle. Multiple additional lesions are seen including the posterior right and left ilium and sacrum. Paraspinous soft tissue structures demonstrate abnormal signal to the right of the L3 vertebral body extending into the psoas musculature and posterior paraspinous musculature inferiorly to the level of L4-5. Epidural tumor is seen from L3 to L5 with areas of extension out of both the vertebral body and posterior elements. The conus medullaris is normal in signal and position. Imaged intra-abdominal contents show partial visualization of abdominal aortic aneurysm which is been repaired.  The T11-12 level is imaged in the sagittal plane only and negative.  T12-L1: Small central protrusion is identified without central canal or foraminal narrowing.  L1-2:  Negative.  L2-3: Shallow disc bulge to the left without central canal or foraminal narrowing.  L3-4: Epidural tumor on the right extends into the right foramen and right paravertebral space and encroaches on the right L3 root and descending right L4 root. The left foramen appears open. There is also disc bulge this level.  L4-5: Epidural tumor extends out of the vertebral body narrowing the central canal with bulky tumor extension into the right paravertebral space and foramen encroaching on the exiting right L4 root.  L5-S1: Large epidural  tumor deposits are seen extending out of the right lamina of L5 causing marked narrowing of the central canal and right lateral recess with encroachment on the descending and exiting right L5 root. There is a shallow disc bulge at this level.  IMPRESSION: Findings consistent with multifocal metastatic disease. Extensive epidural and right paraspinous tumor is also present resulting in central canal narrowing and encroachment on both descending and exiting  nerve roots as described above. Most notably affected levels are from L3 to L5-S1 level.  Findings were discussed with Dr. Carmin Muskrat at the time of interpretation.   Electronically Signed   By: Inge Rise M.D.   On: 07/04/2014 10:02    EKG: Independently reviewed. Normal sinus rhythm with nonspecific ST-T wave changes.  Assessment/Plan Principal Problem:   Metastatic cancer Active Problems:   Rheumatoid arthritis   Hypertension   Lung nodule   Pain of right lower extremity-Lower Back and  Legs   Weakness-Bilateral Leg and Right arm > Left arm   Thrombosis of left subclavian vein   Abnormal thyroid exam   Leukocytosis   Dysphagia   SOB (shortness of breath)   Thyroid enlargement   Poor dentition   Epidural mass   Right paraspinous mass   Rib lesion   Dehydration   Anemia   Metastasis   #1 metastatic disease of unknown etiology/widely metastatic lesions Patient noted to have findings on CT scan of the neck chest and MRI of the L-spine consistent with metastatic disease of unknown primary. Patient also noted to have massive thyroid gland enlargement with adjacent phlegmon. Will admit patient to the stepdown unit due to concerns for possible airway compromise. Patient with no stridor at this time. Patient is to be seen by Dr. Erik Obey of ENT and Dr.Ennever of oncology who will assess the patient for further evaluation and management. Patient may undergo a thyroid biopsy. Will check thyroid studies of her TSH, free T4, T3. Check  a PSA. Check a CEA. Check a CA 19 9. Supportive care. Follow.  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 Patient is to be assessed by oncology and will defer to them as to whether patient needs to be on anticoagulation.  #3 dysphagia/shortness of breath Likely secondary to enlarged thyroid. Patient currently speaking in full sentences. Patient is to be seen by ENT to assess thyroid abnormality. Follow.  #4 carious dentition Will check a orthopantogram. Will need to consult with dental surgery.  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 Check an anemia panel. Follow H&H.  #6 leukocytosis Likely reactive leukocytosis. Patient has been pancultured. No source of infection noted at this time. We'll hold off on antibiotics at this time.  #7 history of rheumatoid arthritis Will hold Plaquenil M methotrexate for now.  #8 epidural mass/spinal mass Oncology evaluating. Patient may benefit from steroids.  #9 hypertension Resume regimen of antihypertensive agents. Hydralazine when necessary.  #10 prophylaxis PPI for GI prophylaxis. Lovenox for DVT prophylaxis.  Code Status: Full DVT Prophylaxis: Lovenox Family Communication: Updated patient and daughter-in-law at bedside. Disposition Plan: Admit to the stepdown unit.  Time spent: 50 minutes  Stefanos Haynesworth M.D. [facility] Pager [phone]

## 2014-07-04 NOTE — Consult Note (Signed)
[place]  Telephone:(336) [place] NOTE  TOBIAS AVITABILE                                MR#: [id number]  DOB: 05/29/1940                       CSN#: [id number]  Referring MD: Dr. Sheliah Plane [facility] Patient Care Team: [name], MD as PCP - General (Internal Medicine)  Reason for Consult: Metastatic Bone Lesions   HPI :[name] [name] is a 74 y.o. [place]  male  Former smoker with a history LUL pulmonary nodules followed since 2011 according to the chart  under the care of Dr. 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 He has lost 10 lbs over the last 2 weeks because of this, despite good appetite. He denies any nausea or vomiting, no vertigo. He denies any vision changes, headaches or seizures.No confusion is reported. He denies any shortness of breath or chest pain. He denies any abdominal pain. He Reports being increasingly weak, and has fallen at home without losing consciousness, hitting his right leg. No neuropathy.  CT of the neck shows Massive thyroid gland enlargement with adjacent phlegmon. LEFT-greater-than- RIGHT necrotic cervical lymphadenopathy. 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 This may be odontogenic or metastatic. CT of the chest showed No discrete pulmonary mass. A 4 mm pleural-based nodule noted in the peripheral right upper lobe with no other suspicious nodules Small, right greater than left, pleural effusions with associated dependent atelectasis seen. No pulmonary edema. There are lytic lesions involving 3 discrete ribs. The left  fifth rib also has associated soft tissue mass. This is highly suspicious for metastatic disease to bone MRI of the L spine showed multifocal metastatic disease. 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 Most notably affected levels are from L3 to L5-S1 level.  Patient is to be admitted for further workup    Brief Oncological History: [name] is a former smoker who was first noted to have pulmonary nodules on a CT in 2011 when he was hospitalized in [place] for pneumonia.  He had an annual exam in March of this year and had a chest xray as part of this visit.  An abnormality was identified, followed by a CT chest in April with a new LUL nodule compared with the prior CT in 2011.  A PET ct was performed in intermediate uptake.  He has been followed with serial CTs at [facility], last in 07/2013, stable  PMH:  Past Medical History  Diagnosis Date  . Lung nodule 2012  . Insomnia     takes Restoril nightly as needed  . Hypertension     takes Benazepril and Metoprolol daily  . Pneumonia     hx of;many yrs ago  . AAA (abdominal aortic aneurysm)   . Joint pain   . Joint swelling   . Urinary  frequency     takes Terazosin daily  . Urinary urgency   . Nocturia   . Enlarged prostate     slightly  . Rheumatoid arthritis(714.0)     takes Plaquenil daily and Methotrexate weekly    Surgeries:  Past Surgical History  Procedure Laterality Date  . Inguinal hernia repair  2008  . Appendectomy    . Abdominal aortic endovascular stent graft Bilateral 12/11/2013    Procedure: ABDOMINAL AORTIC ENDOVASCULAR STENT GRAFT; left groin exploration;  Surgeon: Rosetta Posner, MD;  Location: [facility] OR;  Service: Vascular;  Laterality: Bilateral;    Allergies:  Allergies  Allergen Reactions  . Other     Catfish + IV pain medication (unknown name)-vomits    Medications:   Prior to  Admission:  Prescriptions prior to admission  Medication Sig Dispense Refill Last Dose  . benazepril (LOTENSIN) 10 MG tablet Take 10 mg by mouth daily.   07/04/2014 at Unknown time  . Calcium 600-200 MG-UNIT per tablet Take 1 tablet by mouth daily.    Past Week at Unknown time  . Cholecalciferol (D 2000) 2000 UNITS TABS Take 1 tablet by mouth daily.    Past Week at Unknown time  . fish oil-omega-3 fatty acids 1000 MG capsule Take 1 g by mouth daily.   Past Week at Unknown time  . folic acid (FOLVITE) 1 MG tablet Take 1 mg by mouth daily.   07/03/2014 at Unknown time  . hydroxychloroquine (PLAQUENIL) 200 MG tablet Take 200 mg by mouth 2 (two) times daily.   07/04/2014 at Unknown time  . ibuprofen (ADVIL,MOTRIN) 200 MG tablet Take 200 mg by mouth every 4 (four) hours as needed for moderate pain.   07/03/2014 at Unknown time  . methotrexate 2.5 MG tablet Take 17.5 mg by mouth every 7 (seven) days. Pt states he either takes it on Friday or Saturday.   06/27/2014 at Unknown time  . metoprolol succinate (TOPROL-XL) 25 MG 24 hr tablet Take 25 mg by mouth daily.   07/03/2014 at 2000  . Misc Natural Products (OSTEO BI-FLEX JOINT SHIELD) TABS Take 1 tablet by mouth daily.    Past Week at Unknown time  . Multiple Vitamin (MULTIVITAMIN) tablet Take 1 tablet by mouth daily.   07/03/2014 at Unknown time  . vitamin E (VITAMIN E) 400 UNIT capsule Take 400 Units by mouth 2 (two) times daily.    Past Week at Unknown time  . zolpidem (AMBIEN) 10 MG tablet Take 10 mg by mouth at bedtime as needed for sleep.   Past Week at Unknown time  . doxycycline (VIBRA-TABS) 100 MG tablet    Completed Course at Unknown time  . temazepam (RESTORIL) 15 MG capsule Take 1 capsule (15 mg total) by mouth at bedtime as needed for sleep. 30 capsule 5   . traMADol (ULTRAM) 50 MG tablet Take 1 tablet (50 mg total) by mouth every 6 (six) hours as needed for moderate pain. (Patient not taking: Reported on 07/04/2014) 20 tablet 0 Not Taking at Unknown time     Scheduled Meds: . [START ON 07/05/2014] benazepril  10 mg Oral Daily  . budesonide-formoterol  2 puff Inhalation BID  . docusate sodium  100 mg Oral BID  . enoxaparin (LOVENOX) injection  40 mg Subcutaneous Q24H  . metoprolol succinate  25 mg Oral Daily  . sodium chloride  3 mL Intravenous Q12H  . tiotropium  18 mcg Inhalation Q24H   Continuous Infusions: . sodium chloride    .  sodium chloride 75 mL/hr at 07/04/14 1439   PRN Meds:.acetaminophen **OR** acetaminophen, albuterol, alum & mag hydroxide-simeth, ibuprofen, ipratropium, lidocaine, magnesium citrate, ondansetron **OR** ondansetron (ZOFRAN) IV, oxyCODONE, polyethylene glycol, sorbitol, temazepam, traMADol   ROS: As per HPI, rest of ROS negative   Family History:    Family History  Problem Relation Age of Onset  . Hypertension Father   . Stroke Father   . Hypertension Mother   . Diabetes Mother   . Hyperlipidemia Mother     No family history of hematological  disorders.  Social History:  reports that he has quit smoking. His smoking use included Cigarettes, quit in 2010. He has never used smokeless tobacco. He reports that he does not drink alcohol or use illicit drugs. 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 2 children. His wife is returning to the [place] from [place]. He is originally from [place].   Physical Exam    ECOG PERFORMANCE STATUS:2-3  Filed Vitals:   07/04/14 1422  BP: 199/79  Pulse: 102  Temp: 98.5 F (36.9 C)  Resp: 18   Filed Weights   07/04/14 1422  Weight: 128 lb 15.5 oz (58.5 kg)    GENERAL:alert, no distress and comfortable SKIN: skin color, texture, turgor are normal, no rashes or significant lesions. Some bruises on both knees after falling. EYES: normal, conjunctiva are pink and non-injected, sclera clear OROPHARYNX:no exudate, no erythema and lips, buccal mucosa, and tongue normal  NECK: supple, thyroid enlarged, non-tender,bilateral, L>R fullness at the cervical area. LYMPH:  no  palpable lymphadenopathy in the axillary or inguinal area LUNGS: clear to auscultation and percussion with normal breathing effort HEART: regular rate & rhythm and no murmurs and no lower extremity edema ABDOMEN:abdomen soft, non-tender and normal bowel sounds Musculoskeletal:no cyanosis of digits and no clubbing  PSYCH: alert & oriented x 3 with fluent speech NEURO: no focal motor/sensory deficits    Labs:  CBC   Recent Labs Lab 07/04/14 1154  WBC 20.0*  HGB 10.4*  HCT 31.4*  PLT 162  MCV 94.9  MCH 31.4  MCHC 33.1  RDW 13.9  LYMPHSABS 1.8  MONOABS 2.1*  EOSABS 1.0*  BASOSABS 0.1     CMP    Recent Labs Lab 07/04/14 1154  NA 135  K 4.6  CL 105  CO2 21*  GLUCOSE 99  BUN 32*  CREATININE 0.51*  CALCIUM 9.1  AST 34  ALT 32  ALKPHOS 66  BILITOT 1.1     Anemia panel:  No results for input(s): VITAMINB12, FOLATE, FERRITIN, TIBC, IRON, RETICCTPCT in the last 72 hours.    Imaging Studies:  Ct Soft Tissue Neck W Contrast  07/03/2014   CLINICAL DATA:  Neck mass for 2 weeks. Increasing size of neck mass.  EXAM: CT NECK WITH CONTRAST  TECHNIQUE: Multidetector CT imaging of the neck was performed using the standard protocol following the bolus administration of intravenous contrast.  CONTRAST:  75 mL Isovue-300  COMPARISON:  PET-CT 08/17/2011.  Chest CT today.  FINDINGS: Pharynx and larynx: Cystic lesion is present in the body of the RIGHT mandible which may be odontogenic or represent metastatic disease. Oropharynx and nasopharynx appears patent.  Salivary glands: Parotid glands are within normal limits. Submandibular glands also appear within normal limits.  Thyroid: Massively enlarged thyroid gland with diffuse phlegmon around the gland. There is low attenuation in the central LEFT thyroid lobe that measures about 3.3 x 2.6 cm. This probably represents metastatic disease in a patient with primary malignancy.  Lymph nodes: Innumerable necrotic lymph nodes are present  in the LEFT greater than RIGHT level 2 and level 3 nodal stations extending into the chest.  Vascular: Carotid atherosclerosis. Diminutive LEFT internal jugular vein. There is a large thrombus in LEFT subclavian vein that extends to the margin of the LEFT brachiocephalic vein.  Limited intracranial: No gross acute abnormality.  Mastoids and visualized paranasal sinuses: Mastoid air cells are clear. Mucous retention cyst/ polyp in the LEFT maxillary sinus.  Skeleton: Cervical spine degenerative disease. No pathologic fractures.  Upper chest: Deferred to chest CT.  IMPRESSION: 1. Massive thyroid gland enlargement with adjacent phlegmon. LEFT-greater-than- RIGHT necrotic cervical lymphadenopathy. 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 Acute suppurative thyroiditis is in the differential considerations. 2. 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 3. Markedly carious dentition with osteolysis in the RIGHT mandibular body. This may be odontogenic or metastatic.   Electronically Signed   By: Dereck Ligas M.D.   On: 07/03/2014 16:56   Ct Chest Wo Contrast  07/03/2014   CLINICAL DATA:  F/u lung nodule pain left side with inspirationSob former smokerNo surgNo hx caOld fx'd ribs  EXAM: CT CHEST WITHOUT CONTRAST  TECHNIQUE: Multidetector CT imaging of the chest was performed following the standard protocol without IV contrast.  COMPARISON:  CT, 01/07/2014.  Chest radiograph, 12/11/2013.  FINDINGS: Thoracic inlet: There is heterogeneous soft tissue surrounding the lower trachea consistent with heterogeneous enlargement of the thyroid gland. Margins of the gland are ill-defined. There are multiple prominent neck base lymph nodes the largest 1 cm in short axis. Partly imaged larynx shows some increased soft tissue in mild narrowing of the airway.   Mediastinum and hila: Heart is normal in size. There are mild to moderate coronary artery calcifications. There is dilation of the ascending aorta measuring 4.6 cm x 4.5 cm in diameter. The aorta is mildly dilated along the arch and proximal descending portion. There are diffuse atherosclerotic calcifications throughout the thoracic aorta.  The multiple small mediastinal lymph nodes. There are no pathologically enlarged lymph nodes. No mediastinal masses. No hilar masses or adenopathy.  Lungs and pleura: There are small, right greater than left, bilateral pleural effusions. There are coarse reticular opacities in the right upper lobe with intervening emphysematous cystic spaces and some intervening hazy ground-glass type opacity. 4 mm noncalcified pleural-based nodule noted in the right upper lobe, image 30, series 4. Calcified granuloma is noted in the right lower lobe peripherally. There is mild atelectasis adjacent to the pleural effusions in both lower lobes there is no pulmonary edema. No pneumothorax. Mild emphysema is noted in the left upper lobe.  Limited upper abdomen: Dense upper abdominal aortic calcification and partly imaged proximal aspect of an aortic stent. Visualized portions of the liver, spleen, gallbladder and pancreas are unremarkable. No adrenal masses.  Musculoskeletal: There are lytic process cysts involving 3 discrete ribs, of the right posterior fourth rib, right posterior and lateral seventh rib and the left anterior and lateral fifth rib. The soft tissue mass surrounds the lytic process of the left antral lateral fifth rib. No other lytic lesions.  IMPRESSION: 1. There are coarse reticular opacities with intervening hazy ground-glass opacity and associated if edematous cystic change in the right upper lobe. This could all be chronic. Acute infectious or inflammatory infiltrate is possible. 2. No discrete pulmonary mass. 4 mm pleural-based  nodule noted in the peripheral right upper lobe.  No other suspicious nodules 3. Small, right greater than left, pleural effusions with associated dependent atelectasis. No pulmonary edema. 4. Neck base abnormalities. Patient also underwent neck CT. There is ill-defined soft tissue that involves and surrounds the thyroid gland. The thyroid appears enlarged and heterogeneous. There are multiple prominent neck base lymph nodes. 5. There are lytic lesions involving 3 discrete ribs. The left fifth rib also has associated soft tissue mass. This is highly suspicious for metastatic disease to bone.   Electronically Signed   By: Lajean Manes M.D.   On: 07/03/2014 16:39   Mr Lumbar Spine Wo Contrast  07/04/2014   CLINICAL DATA:  Severe low back pain radiating into the right hip and leg. Symptoms for 1 year.  EXAM: MRI LUMBAR SPINE WITHOUT CONTRAST  TECHNIQUE: Multiplanar, multisequence MR imaging of the lumbar spine was performed. No intravenous contrast was administered.  COMPARISON:  CT abdomen and pelvis 01/07/2014. PET CT scan 08/17/2011.  FINDINGS: Multiple foci of abnormal marrow signal are identified post consistent with metastatic disease or less likely multiple myeloma. L4 is completely replaced by tumor. A large lesion is also identified and L5 eccentric to the right extending into the right pedicle. Multiple additional lesions are seen including the posterior right and left ilium and sacrum. Paraspinous soft tissue structures demonstrate abnormal signal to the right of the L3 vertebral body extending into the psoas musculature and posterior paraspinous musculature inferiorly to the level of L4-5. Epidural tumor is seen from L3 to L5 with areas of extension out of both the vertebral body and posterior elements. The conus medullaris is normal in signal and position. Imaged intra-abdominal contents show partial visualization of abdominal aortic aneurysm which is been repaired.  The T11-12 level is imaged in the sagittal plane only and negative.  T12-L1: Small  central protrusion is identified without central canal or foraminal narrowing.  L1-2:  Negative.  L2-3: Shallow disc bulge to the left without central canal or foraminal narrowing.  L3-4: Epidural tumor on the right extends into the right foramen and right paravertebral space and encroaches on the right L3 root and descending right L4 root. The left foramen appears open. There is also disc bulge this level.  L4-5: Epidural tumor extends out of the vertebral body narrowing the central canal with bulky tumor extension into the right paravertebral space and foramen encroaching on the exiting right L4 root.  L5-S1: Large epidural tumor deposits are seen extending out of the right lamina of L5 causing marked narrowing of the central canal and right lateral recess with encroachment on the descending and exiting right L5 root. There is a shallow disc bulge at this level.  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 Most notably affected levels are from L3 to L5-S1 level.  Findings were discussed with Dr. Carmin Muskrat at the time of interpretation.   Electronically Signed   By: Inge Rise M.D.   On: 07/04/2014 10:02    CT chest 08/19/2013 - personally reviewed Multiple pulmonary nodules are unchanged compared to the prior examination. Despite stability, indolent neoplasm, such as adenocarcinoma,is not entirely excluded. Continued surveillance recommended.   A/P: 74 y.o. male with  History of lung nodules Widely metastatic Lesions CT of the neck shows Massive thyroid gland enlargement with adjacent phlegmon. LEFT-greater-than- RIGHT  necrotic cervical lymphadenopathy. Markedly carious dentition with osteolysis in the RIGHT mandibular body. This may be odontogenic or metastatic. CT of the chest showed No  discrete pulmonary mass. A 4 mm pleural-based nodule noted in the peripheral right upper lobe with no other suspicious nodules.There are lytic lesions involving 3 discrete ribs. The left fifth rib also has associated soft tissue mass. This is highly suspicious for metastatic disease to bone MRI of the L spine showed multifocal metastatic disease. 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 Most notably affected levels are from L3 to L5-S1 level.  Patient is  admitted for further workup Will need to complete staging with CT of the abdomen and pelvis, MRI brain rule out occult disease PET scan as an outpatient Will need tissue diagnosis to rule in primary so that treatment options can be initiated. Differentials could include lung, prostate, versus other etiology Check PSA Consider Rad Onc evaluation  For radiation to the lytic bone lesions Patient wishes to stay local for continuation of care.  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  **Disclaimer: This note was dictated with voice recognition software. Similar sounding words can inadvertently be transcribed and this note may contain transcription errors which may not have been corrected upon  publication of note.** WERTMAN,SARA E, PA-C 07/04/2014 3:44 PM  ADDENDUM:  I saw and examined the patient. I agree with the above. I think the issue is where the primary is. He was a smoker. One would have to suspect that this might be a metastatic occult lung cancer.  A primary thyroid malignancy I think would be a little unusual.  I suppose lymphoma is was a possibility with him being on methotrexate. However, the appearance of his MRI would be unlikely.  The key must be a tissue biopsy. I would suspect that a thyroid biopsy would be the easiest site for biopsy.  I very spoken with radiation oncology. They will see the patient on Monday. They certainly would prefer to have a tissue diagnosis before any therapy.  He clearly needs radiation therapy to the lumbar spine. I would consider interventional radiology to see him to see if any type of kyphoplasty or vertebroplasty needs to be done.  I will start him on Decadron. This may help with some of the swelling would associate with the thyroid.  I'll also give him Zometa.  He will need chronic anticoagulation. He has a thrombus.  I don't see any reason why he cannot be on Lovenox right now. I would not anticipate any biopsy until Monday at the earliest. I think we can get started on some anticoagulation before then.  He understands that whatever we are dealing with is not curable. Again the only exception might be lymphoma.  He is very nice. 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  He probably needs  an MRI of the brain.  A dedicated CT of the abdomen/ pelvis also think would be helpful.  We literally on starting from "square one".  Pete E.  Romans 8:28

## 2014-07-04 NOTE — Consult Note (Signed)
Jabbar, Palmero 74 y.o., male [id number]     Chief Complaint: neck swelling  HPI: 74 yo asian male, dept IT sales professional at [employer], comes in with a 2 week hx of difficulty swallowing, wt loss (10 lbs) and neck swelling.  He has been under surveillance for PET ?+ lung nodule for 2, maybe 3 yrs.  He has complained about some subjective dyspnea just this week.  Eval by his primary physician revealed dramatic neck swelling.  CT scan of neck and chest yesterday shows a lytic lesion of his LEFT mandible, multiple necrotic nodes in both sides of his neck, dramatic enlargement of his thyroid with a semi-discrete lesion in the LEFT lobe, and surrounding phlegmon/inflammation.  He has suspicious for metastatic deposits in several ribs.  MRI of the spine today shows compressive involvement at multiple sites in his lumbar spine.  I was called this morning for assistance with his dyspnea. He really has no neck pain except with deep palpation and then the symptoms are quite mild.  He is coughing up some bloody sputum.  No change in voice.  Swallowing is difficult but not necessarily painful.  He has fallen several times and complains about leg pain from the trauma.   PMH: Past Medical History  Diagnosis Date  . Lung nodule 2012  . Insomnia     takes Restoril nightly as needed  . Hypertension     takes Benazepril and Metoprolol daily  . Pneumonia     hx of;many yrs ago  . AAA (abdominal aortic aneurysm)   . Joint pain   . Joint swelling   . Urinary frequency     takes Terazosin daily  . Urinary urgency   . Nocturia   . Enlarged prostate     slightly  . Rheumatoid arthritis(714.0)     takes Plaquenil daily and Methotrexate weekly    Surg Hx: Past Surgical History  Procedure Laterality Date  . Inguinal hernia repair  2008  . Appendectomy    . Abdominal aortic endovascular stent graft Bilateral 12/11/2013    Procedure: ABDOMINAL AORTIC ENDOVASCULAR STENT GRAFT; left groin exploration;   Surgeon: Rosetta Posner, MD;  Location: [facility] OR;  Service: Vascular;  Laterality: Bilateral;    FHx:   Family History  Problem Relation Age of Onset  . Hypertension Father   . Stroke Father   . Hypertension Mother   . Diabetes Mother   . Hyperlipidemia Mother    SocHx:  reports that he has quit smoking. His smoking use included Cigarettes. He has never used smokeless tobacco. He reports that he does not drink alcohol or use illicit drugs.  ALLERGIES:  Allergies  Allergen Reactions  . Other     Catfish + IV pain medication (unknown name)-vomits    Medications Prior to Admission  Medication Sig Dispense Refill  . benazepril (LOTENSIN) 10 MG tablet Take 10 mg by mouth daily.    . Calcium 600-200 MG-UNIT per tablet Take 1 tablet by mouth daily.     . Cholecalciferol (D 2000) 2000 UNITS TABS Take 1 tablet by mouth daily.     . fish oil-omega-3 fatty acids 1000 MG capsule Take 1 g by mouth daily.    . folic acid (FOLVITE) 1 MG tablet Take 1 mg by mouth daily.    . hydroxychloroquine (PLAQUENIL) 200 MG tablet Take 200 mg by mouth 2 (two) times daily.    Marland Kitchen ibuprofen (ADVIL,MOTRIN) 200 MG tablet Take 200 mg by mouth  every 4 (four) hours as needed for moderate pain.    . methotrexate 2.5 MG tablet Take 17.5 mg by mouth every 7 (seven) days. Pt states he either takes it on Friday or Saturday.    . metoprolol succinate (TOPROL-XL) 25 MG 24 hr tablet Take 25 mg by mouth daily.    . Misc Natural Products (OSTEO BI-FLEX JOINT SHIELD) TABS Take 1 tablet by mouth daily.     . Multiple Vitamin (MULTIVITAMIN) tablet Take 1 tablet by mouth daily.    . vitamin E (VITAMIN E) 400 UNIT capsule Take 400 Units by mouth 2 (two) times daily.     Marland Kitchen zolpidem (AMBIEN) 10 MG tablet Take 10 mg by mouth at bedtime as needed for sleep.    Marland Kitchen doxycycline (VIBRA-TABS) 100 MG tablet     . temazepam (RESTORIL) 15 MG capsule Take 1 capsule (15 mg total) by mouth at bedtime as needed for sleep. 30 capsule 5  . traMADol  (ULTRAM) 50 MG tablet Take 1 tablet (50 mg total) by mouth every 6 (six) hours as needed for moderate pain. (Patient not taking: Reported on 07/04/2014) 20 tablet 0    Results for orders placed or performed during the hospital encounter of 07/04/14 (from the past 48 hour(s))  Comprehensive metabolic panel     Status: Abnormal   Collection Time: 07/04/14 11:54 AM  Result Value Ref Range   Sodium 135 135 - 145 mmol/L   Potassium 4.6 3.5 - 5.1 mmol/L   Chloride 105 101 - 111 mmol/L   CO2 21 (L) 22 - 32 mmol/L   Glucose, Bld 99 70 - 99 mg/dL   BUN 32 (H) 6 - 20 mg/dL   Creatinine, Ser 0.51 (L) 0.61 - 1.24 mg/dL   Calcium 9.1 8.9 - 10.3 mg/dL   Total Protein 5.8 (L) 6.5 - 8.1 g/dL   Albumin 3.2 (L) 3.5 - 5.0 g/dL   AST 34 15 - 41 U/L   ALT 32 17 - 63 U/L   Alkaline Phosphatase 66 38 - 126 U/L   Total Bilirubin 1.1 0.3 - 1.2 mg/dL   GFR calc non Af Amer >60 >60 mL/min   GFR calc Af Amer >60 >60 mL/min    Comment: (NOTE) The eGFR has been calculated using the CKD EPI equation. This calculation has not been validated in all clinical situations. eGFR's persistently <60 mL/min signify possible Chronic Kidney Disease.    Anion gap 9 5 - 15  CBC with Differential     Status: Abnormal   Collection Time: 07/04/14 11:54 AM  Result Value Ref Range   WBC 20.0 (H) 4.0 - 10.5 K/uL   RBC 3.31 (L) 4.22 - 5.81 MIL/uL   Hemoglobin 10.4 (L) 13.0 - 17.0 g/dL   HCT 31.4 (L) 39.0 - 52.0 %   MCV 94.9 78.0 - 100.0 fL   MCH 31.4 26.0 - 34.0 pg   MCHC 33.1 30.0 - 36.0 g/dL   RDW 13.9 11.5 - 15.5 %   Platelets 162 150 - 400 K/uL   Neutrophils Relative % 75 43 - 77 %   Neutro Abs 15.0 (H) 1.7 - 7.7 K/uL   Lymphocytes Relative 9 (L) 12 - 46 %   Lymphs Abs 1.8 0.7 - 4.0 K/uL   Monocytes Relative 11 3 - 12 %   Monocytes Absolute 2.1 (H) 0.1 - 1.0 K/uL   Eosinophils Relative 5 0 - 5 %   Eosinophils Absolute 1.0 (H) 0.0 - 0.7 K/uL  Basophils Relative 0 0 - 1 %   Basophils Absolute 0.1 0.0 - 0.1 K/uL   Protime-INR     Status: Abnormal   Collection Time: 07/04/14 11:54 AM  Result Value Ref Range   Prothrombin Time 17.2 (H) 11.6 - 15.2 seconds   INR 1.39 0.00 - 1.49  Urinalysis, Routine w reflex microscopic     Status: Abnormal   Collection Time: 07/04/14  1:59 PM  Result Value Ref Range   Color, Urine AMBER (A) YELLOW    Comment: BIOCHEMICALS MAY BE AFFECTED BY COLOR   APPearance CLEAR CLEAR   Specific Gravity, Urine 1.031 (H) 1.005 - 1.030   pH 5.5 5.0 - 8.0   Glucose, UA NEGATIVE NEGATIVE mg/dL   Hgb urine dipstick MODERATE (A) NEGATIVE   Bilirubin Urine NEGATIVE NEGATIVE   Ketones, ur 40 (A) NEGATIVE mg/dL   Protein, ur 30 (A) NEGATIVE mg/dL   Urobilinogen, UA 0.2 0.0 - 1.0 mg/dL   Nitrite NEGATIVE NEGATIVE   Leukocytes, UA NEGATIVE NEGATIVE  Urine microscopic-add on     Status: Abnormal   Collection Time: 07/04/14  1:59 PM  Result Value Ref Range   WBC, UA 0-2 <3 WBC/hpf   RBC / HPF 11-20 <3 RBC/hpf   Bacteria, UA FEW (A) RARE   Casts HYALINE CASTS (A) NEGATIVE   Urine-Other MUCOUS PRESENT   MRSA PCR Screening     Status: None   Collection Time: 07/04/14  2:42 PM  Result Value Ref Range   MRSA by PCR NEGATIVE NEGATIVE    Comment:        The GeneXpert MRSA Assay (FDA approved for NASAL specimens only), is one component of a comprehensive MRSA colonization surveillance program. It is not intended to diagnose MRSA infection nor to guide or monitor treatment for MRSA infections.   CBC     Status: Abnormal   Collection Time: 07/04/14  4:00 PM  Result Value Ref Range   WBC 20.5 (H) 4.0 - 10.5 K/uL   RBC 3.20 (L) 4.22 - 5.81 MIL/uL   Hemoglobin 10.2 (L) 13.0 - 17.0 g/dL   HCT 30.3 (L) 39.0 - 52.0 %   MCV 94.7 78.0 - 100.0 fL   MCH 31.9 26.0 - 34.0 pg   MCHC 33.7 30.0 - 36.0 g/dL   RDW 13.8 11.5 - 15.5 %   Platelets 152 150 - 400 K/uL  Creatinine, serum     Status: Abnormal   Collection Time: 07/04/14  4:00 PM  Result Value Ref Range   Creatinine, Ser 0.56  (L) 0.61 - 1.24 mg/dL   GFR calc non Af Amer >60 >60 mL/min   GFR calc Af Amer >60 >60 mL/min    Comment: (NOTE) The eGFR has been calculated using the CKD EPI equation. This calculation has not been validated in all clinical situations. eGFR's persistently <60 mL/min signify possible Chronic Kidney Disease.   Magnesium     Status: None   Collection Time: 07/04/14  4:00 PM  Result Value Ref Range   Magnesium 2.2 1.7 - 2.4 mg/dL  TSH     Status: Abnormal   Collection Time: 07/04/14  4:00 PM  Result Value Ref Range   TSH 0.016 (L) 0.350 - 4.500 uIU/mL  Reticulocytes     Status: Abnormal   Collection Time: 07/04/14  4:00 PM  Result Value Ref Range   Retic Ct Pct 3.5 (H) 0.4 - 3.1 %   RBC. 3.20 (L) 4.22 - 5.81 MIL/uL   Retic Count,  Manual 112.0 19.0 - 186.0 K/uL   Ct Soft Tissue Neck W Contrast  07/03/2014   CLINICAL DATA:  Neck mass for 2 weeks. Increasing size of neck mass.  EXAM: CT NECK WITH CONTRAST  TECHNIQUE: Multidetector CT imaging of the neck was performed using the standard protocol following the bolus administration of intravenous contrast.  CONTRAST:  75 mL Isovue-300  COMPARISON:  PET-CT 08/17/2011.  Chest CT today.  FINDINGS: Pharynx and larynx: Cystic lesion is present in the body of the RIGHT mandible which may be odontogenic or represent metastatic disease. Oropharynx and nasopharynx appears patent.  Salivary glands: Parotid glands are within normal limits. Submandibular glands also appear within normal limits.  Thyroid: Massively enlarged thyroid gland with diffuse phlegmon around the gland. There is low attenuation in the central LEFT thyroid lobe that measures about 3.3 x 2.6 cm. This probably represents metastatic disease in a patient with primary malignancy.  Lymph nodes: Innumerable necrotic lymph nodes are present in the LEFT greater than RIGHT level 2 and level 3 nodal stations extending into the chest.  Vascular: Carotid atherosclerosis. Diminutive LEFT internal jugular  vein. There is a large thrombus in LEFT subclavian vein that extends to the margin of the LEFT brachiocephalic vein.  Limited intracranial: No gross acute abnormality.  Mastoids and visualized paranasal sinuses: Mastoid air cells are clear. Mucous retention cyst/ polyp in the LEFT maxillary sinus.  Skeleton: Cervical spine degenerative disease. No pathologic fractures.  Upper chest: Deferred to chest CT.  IMPRESSION: 1. Massive thyroid gland enlargement with adjacent phlegmon. LEFT-greater-than- RIGHT necrotic cervical lymphadenopathy. 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 Acute suppurative thyroiditis is in the differential considerations. 2. 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 3. Markedly carious dentition with osteolysis in the RIGHT mandibular body. This may be odontogenic or metastatic.   Electronically Signed   By: Dereck Ligas M.D.   On: 07/03/2014 16:56   Ct Chest Wo Contrast  07/03/2014   CLINICAL DATA:  F/u lung nodule pain left side with inspirationSob former smokerNo surgNo hx caOld fx'd ribs  EXAM: CT CHEST WITHOUT CONTRAST  TECHNIQUE: Multidetector CT imaging of the chest was performed following the standard protocol without IV contrast.  COMPARISON:  CT, 01/07/2014.  Chest radiograph, 12/11/2013.  FINDINGS: Thoracic inlet: There is heterogeneous soft tissue surrounding the lower trachea consistent with heterogeneous enlargement of the thyroid gland. Margins of the gland are ill-defined. There are multiple prominent neck base lymph nodes the largest 1 cm in short axis. Partly imaged larynx shows some increased soft tissue in mild narrowing of the airway.  Mediastinum and hila: Heart is normal in size. There are mild to moderate coronary artery calcifications. There is dilation of the ascending aorta measuring 4.6 cm x  4.5 cm in diameter. The aorta is mildly dilated along the arch and proximal descending portion. There are diffuse atherosclerotic calcifications throughout the thoracic aorta.  The multiple small mediastinal lymph nodes. There are no pathologically enlarged lymph nodes. No mediastinal masses. No hilar masses or adenopathy.  Lungs and pleura: There are small, right greater than left, bilateral pleural effusions. There are coarse reticular opacities in the right upper lobe with intervening emphysematous cystic spaces and some intervening hazy ground-glass type opacity. 4 mm noncalcified pleural-based nodule noted in the right upper lobe, image 30, series 4. Calcified granuloma is noted in the  right lower lobe peripherally. There is mild atelectasis adjacent to the pleural effusions in both lower lobes there is no pulmonary edema. No pneumothorax. Mild emphysema is noted in the left upper lobe.  Limited upper abdomen: Dense upper abdominal aortic calcification and partly imaged proximal aspect of an aortic stent. Visualized portions of the liver, spleen, gallbladder and pancreas are unremarkable. No adrenal masses.  Musculoskeletal: There are lytic process cysts involving 3 discrete ribs, of the right posterior fourth rib, right posterior and lateral seventh rib and the left anterior and lateral fifth rib. The soft tissue mass surrounds the lytic process of the left antral lateral fifth rib. No other lytic lesions.  IMPRESSION: 1. There are coarse reticular opacities with intervening hazy ground-glass opacity and associated if edematous cystic change in the right upper lobe. This could all be chronic. Acute infectious or inflammatory infiltrate is possible. 2. No discrete pulmonary mass. 4 mm pleural-based nodule noted in the peripheral right upper lobe. No other suspicious nodules 3. Small, right greater than left, pleural effusions with associated dependent atelectasis. No pulmonary edema. 4. Neck base abnormalities.  Patient also underwent neck CT. There is ill-defined soft tissue that involves and surrounds the thyroid gland. The thyroid appears enlarged and heterogeneous. There are multiple prominent neck base lymph nodes. 5. There are lytic lesions involving 3 discrete ribs. The left fifth rib also has associated soft tissue mass. This is highly suspicious for metastatic disease to bone.   Electronically Signed   By: Lajean Manes M.D.   On: 07/03/2014 16:39   Mr Lumbar Spine Wo Contrast  07/04/2014   CLINICAL DATA:  Severe low back pain radiating into the right hip and leg. Symptoms for 1 year.  EXAM: MRI LUMBAR SPINE WITHOUT CONTRAST  TECHNIQUE: Multiplanar, multisequence MR imaging of the lumbar spine was performed. No intravenous contrast was administered.  COMPARISON:  CT abdomen and pelvis 01/07/2014. PET CT scan 08/17/2011.  FINDINGS: Multiple foci of abnormal marrow signal are identified post consistent with metastatic disease or less likely multiple myeloma. L4 is completely replaced by tumor. A large lesion is also identified and L5 eccentric to the right extending into the right pedicle. Multiple additional lesions are seen including the posterior right and left ilium and sacrum. Paraspinous soft tissue structures demonstrate abnormal signal to the right of the L3 vertebral body extending into the psoas musculature and posterior paraspinous musculature inferiorly to the level of L4-5. Epidural tumor is seen from L3 to L5 with areas of extension out of both the vertebral body and posterior elements. The conus medullaris is normal in signal and position. Imaged intra-abdominal contents show partial visualization of abdominal aortic aneurysm which is been repaired.  The T11-12 level is imaged in the sagittal plane only and negative.  T12-L1: Small central protrusion is identified without central canal or foraminal narrowing.  L1-2:  Negative.  L2-3: Shallow disc bulge to the left without central canal or foraminal  narrowing.  L3-4: Epidural tumor on the right extends into the right foramen and right paravertebral space and encroaches on the right L3 root and descending right L4 root. The left foramen appears open. There is also disc bulge this level.  L4-5: Epidural tumor extends out of the vertebral body narrowing the central canal with bulky tumor extension into the right paravertebral space and foramen encroaching on the exiting right L4 root.  L5-S1: Large epidural tumor deposits are seen extending out of the right lamina of L5 causing marked narrowing of the  central canal and right lateral recess with encroachment on the descending and exiting right L5 root. There is a shallow disc bulge at this level.  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 Most notably affected levels are from L3 to L5-S1 level.  Findings were discussed with Dr. Carmin Muskrat at the time of interpretation.   Electronically Signed   By: Inge Rise M.D.   On: 07/04/2014 10:02      Blood pressure 199/79, pulse 112, temperature 98.8 F (37.1 C), temperature source Oral, resp. rate 18, height _0  (1.702 m), weight 58.5 kg (128 lb 15.5 oz), SpO2 93 %.  PHYSICAL EXAM: Overall appearance:  Malaised.  Voice is clear.  Cough is good. He is breathing comfortably. Head:  NCAT Ears:mild skin debris accumulation AU Nose:  Corrugated septum Oral Cavity:  Severe dental decay with multiple missing teeth Oral Pharynx/Hypopharynx/Larynx:  Nl by direct exam.  With the flexible laryngoscope, NP is clear.  OP is clear.  HP/Larynx shows some bloody secretions in the supraglottic larynx.  Vocal cords are fully mobile and the airway is good.  No pooling in valleculae or pyriforms. Neuro: cranial nerves intact Neck:massive firm enlargement of the  entire thyroid gland.  Palpable RIGHT low external jugular and level IV jugular nodes.  No overlying skin erythema or edema.  No tenderness.    With informed consent, using 2% viscous xylocaine for lubrication and topical anesthesia, the flexible laryngoscope was introduced without difficulty, with the findings as described above.  He tolerated this well.  Studies Reviewed:  CT neck and chets    Assessment/Plan No evidence of thyroid abscess or cellulitis.  Multiple sites of presumed metastatic disease in neck, chest, abdomen.  Primary site unknown.    We need a tissue diagnosis quickly.  The LEFT lobe of the thyroid would be easily needle aspirated for cytology with ultrasound guidance.  We could do an excisional biopsy of one of the RIGHT low neck nodes .  Sputum for cytology if reasonable, or maybe formal bronchoscopy.  I would do a Barium swallow to help with the question of dysphagia and weight loss.  Likely needs a new PET scan.  Await Medical Oncology to guide the overall work up.    I discussed this with the patient in basic detail.    Jodi Marble 06/03/8590, 6:36 PM

## 2014-07-04 NOTE — ED Notes (Addendum)
Pt has hx of lung ca. Pt had imaging done yesterday. When results got back, it was found he had a blood clot in his subclavian. Pt reports pain on both sides of his neck for past 4 months, that has gotten worse recently. Also complaining of chest pain and sob for the past 2 months. Pt not on chemo. Symptoms have worsened over past 3 weeks.

## 2014-07-04 NOTE — ED Notes (Signed)
MD at bedside. 

## 2014-07-04 NOTE — ED Notes (Addendum)
Pt c/o chronic R leg pain, bilateral lower ribcage pain x 3 months and neck pain x 3 weeks.  Pain score 6/10.  Pt family reports Pt has "soft tissue damage" in leg.  Pt reports ribcage pain is cause by "the internal organs trying to expand, but being stopped by the liver."  NAD noted.

## 2014-07-04 NOTE — Progress Notes (Signed)
   07/04/14 2232  Vitals  BP (!) 200/97 mmHg  MAP (mmHg) 125  BP Method Automatic  Patient Position (if appropriate) Lying  Pulse Rate (!) 112  Pulse Rate Source Monitor  ECG Heart Rate (!) 113  Cardiac Rhythm ST  Resp (!) 21  Oxygen Therapy  SpO2 94 %   Hydralizine 10 mg IVP at this time for elevated BP. ABC's intact,

## 2014-07-04 NOTE — ED Provider Notes (Signed)
CSN: [id number]     Arrival date & time 07/04/14  2778 History   First MD Initiated Contact with Patient 07/04/14 [phone]     Chief Complaint  Patient presents with  . Neck Pain  . Chest Pain     (Consider location/radiation/quality/duration/timing/severity/associated sxs/prior Treatment) HPI Patient presents with concern of pain in multiple areas, as well as new dyspnea, dysphagia. The notable change in patient's condition has occurred over the past 3 weeks. Prior to that the patient has had generalized weakness, weight loss, left-sided rib cage pain. Over the past 3 weeks the patient has developed his dyspnea, dysphagia, increased swelling in the anterior lower throat. No new syncope, vomiting, fever. Patient had outpatient CT scan performed yesterday. With abnormal results was referred here for evaluation. Patient has a history of pulmonary nodule, with no prior oncologic intervention.   Past Medical History  Diagnosis Date  . Lung nodule 2012  . Insomnia     takes Restoril nightly as needed  . Hypertension     takes Benazepril and Metoprolol daily  . Pneumonia     hx of;many yrs ago  . AAA (abdominal aortic aneurysm)   . Joint pain   . Joint swelling   . Urinary frequency     takes Terazosin daily  . Urinary urgency   . Nocturia   . Enlarged prostate     slightly  . Rheumatoid arthritis(714.0)     takes Plaquenil daily and Methotrexate weekly   Past Surgical History  Procedure Laterality Date  . Inguinal hernia repair  2008  . Appendectomy    . Abdominal aortic endovascular stent graft Bilateral 12/11/2013    Procedure: ABDOMINAL AORTIC ENDOVASCULAR STENT GRAFT; left groin exploration;  Surgeon: Rosetta Posner, MD;  Location: [facility] OR;  Service: Vascular;  Laterality: Bilateral;   Family History  Problem Relation Age of Onset  . Hypertension Father   . Stroke Father   . Hypertension Mother   . Diabetes Mother   . Hyperlipidemia Mother    History  Substance Use  Topics  . Smoking status: Former Smoker    Types: Cigarettes  . Smokeless tobacco: Never Used     Comment: quit smoking at least 41yr ago  . Alcohol Use: No    Review of Systems  Constitutional:       Per HPI, otherwise negative  HENT:       Per HPI, otherwise negative  Respiratory:       Per HPI, otherwise negative  Cardiovascular:       Per HPI, otherwise negative  Gastrointestinal: Positive for nausea. Negative for vomiting.  Endocrine:       Negative aside from HPI  Genitourinary:       Neg aside from HPI   Musculoskeletal:       Per HPI, otherwise negative  Skin: Negative.   Allergic/Immunologic: Negative for immunocompromised state.  Neurological: Positive for speech difficulty. Negative for syncope.       Speech change      Allergies  Other  Home Medications   Prior to Admission medications   Medication Sig Start Date End Date Taking? Authorizing Provider  benazepril (LOTENSIN) 10 MG tablet Take 10 mg by mouth daily.   Yes Historical Provider, MD  Calcium 600-200 MG-UNIT per tablet Take 1 tablet by mouth daily.    Yes Historical Provider, MD  Cholecalciferol (D 2000) 2000 UNITS TABS Take 1 tablet by mouth daily.    Yes Historical Provider, MD  fish  oil-omega-3 fatty acids 1000 MG capsule Take 1 g by mouth daily.   Yes Historical Provider, MD  folic acid (FOLVITE) 1 MG tablet Take 1 mg by mouth daily.   Yes Historical Provider, MD  hydroxychloroquine (PLAQUENIL) 200 MG tablet Take 200 mg by mouth 2 (two) times daily.   Yes Historical Provider, MD  ibuprofen (ADVIL,MOTRIN) 200 MG tablet Take 200 mg by mouth every 4 (four) hours as needed for moderate pain.   Yes Historical Provider, MD  methotrexate 2.5 MG tablet Take 10-17.5 mg by mouth 2 (two) times a week. Takes 7.5 mg on Friday, and takes 10 mg on Saturday.   Yes Historical Provider, MD  metoprolol succinate (TOPROL-XL) 25 MG 24 hr tablet Take 25 mg by mouth daily.   Yes Historical Provider, MD  Misc Natural  Products (OSTEO BI-FLEX JOINT SHIELD) TABS Take 1 tablet by mouth daily.    Yes Historical Provider, MD  Multiple Vitamin (MULTIVITAMIN) tablet Take 1 tablet by mouth daily.   Yes Historical Provider, MD  vitamin E (VITAMIN E) 400 UNIT capsule Take 400 Units by mouth 2 (two) times daily.    Yes Historical Provider, MD  zolpidem (AMBIEN) 10 MG tablet Take 10 mg by mouth at bedtime as needed for sleep.   Yes Historical Provider, MD  doxycycline (VIBRA-TABS) 100 MG tablet  11/27/13   Historical Provider, MD  temazepam (RESTORIL) 15 MG capsule Take 1 capsule (15 mg total) by mouth at bedtime as needed for sleep. 02/28/11 03/30/11  Neena Rhymes, MD  traMADol (ULTRAM) 50 MG tablet Take 1 tablet (50 mg total) by mouth every 6 (six) hours as needed for moderate pain. Patient not taking: Reported on 07/04/2014 12/12/13   Samantha J Rhyne, PA-C   BP 171/81 mmHg  Pulse 98  Resp 20  SpO2 94% Physical Exam  Constitutional: He is oriented to person, place, and time. He appears well-developed. No distress.  HENT:  Head: Normocephalic and atraumatic.  Eyes: Conjunctivae and EOM are normal.  Neck:  Appreciable firm mass in the lower throat, nontender, no asymmetry  Cardiovascular: Normal rate and regular rhythm.   Pulmonary/Chest: Effort normal. No stridor. No respiratory distress.  Asymmetry of the chest wall with swelling about the left upper chest  Abdominal: He exhibits no distension. There is no tenderness.  Musculoskeletal: He exhibits no edema.  Neurological: He is alert and oriented to person, place, and time. No cranial nerve deficit. He exhibits normal muscle tone. Coordination normal.  Skin: Skin is warm and dry.  Psychiatric: He has a normal mood and affect. His behavior is normal.  Nursing note and vitals reviewed.   ED Course  Procedures (including critical care time) Labs Review Labs Reviewed - No data to display  Imaging Review Ct Soft Tissue Neck W Contrast  07/03/2014   CLINICAL  DATA:  Neck mass for 2 weeks. Increasing size of neck mass.  EXAM: CT NECK WITH CONTRAST  TECHNIQUE: Multidetector CT imaging of the neck was performed using the standard protocol following the bolus administration of intravenous contrast.  CONTRAST:  75 mL Isovue-300  COMPARISON:  PET-CT 08/17/2011.  Chest CT today.  FINDINGS: Pharynx and larynx: Cystic lesion is present in the body of the RIGHT mandible which may be odontogenic or represent metastatic disease. Oropharynx and nasopharynx appears patent.  Salivary glands: Parotid glands are within normal limits. Submandibular glands also appear within normal limits.  Thyroid: Massively enlarged thyroid gland with diffuse phlegmon around the gland. There is  low attenuation in the central LEFT thyroid lobe that measures about 3.3 x 2.6 cm. This probably represents metastatic disease in a patient with primary malignancy.  Lymph nodes: Innumerable necrotic lymph nodes are present in the LEFT greater than RIGHT level 2 and level 3 nodal stations extending into the chest.  Vascular: Carotid atherosclerosis. Diminutive LEFT internal jugular vein. There is a large thrombus in LEFT subclavian vein that extends to the margin of the LEFT brachiocephalic vein.  Limited intracranial: No gross acute abnormality.  Mastoids and visualized paranasal sinuses: Mastoid air cells are clear. Mucous retention cyst/ polyp in the LEFT maxillary sinus.  Skeleton: Cervical spine degenerative disease. No pathologic fractures.  Upper chest: Deferred to chest CT.  IMPRESSION: 1. Massive thyroid gland enlargement with adjacent phlegmon. LEFT-greater-than- RIGHT necrotic cervical lymphadenopathy. 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 Acute suppurative thyroiditis is in the differential considerations. 2. 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 3. Markedly carious dentition with osteolysis in the RIGHT mandibular body. This may be odontogenic or metastatic.   Electronically Signed   By: Dereck Ligas M.D.   On: 07/03/2014 16:56   Ct Chest Wo Contrast  07/03/2014   CLINICAL DATA:  F/u lung nodule pain left side with inspirationSob former smokerNo surgNo hx caOld fx'd ribs  EXAM: CT CHEST WITHOUT CONTRAST  TECHNIQUE: Multidetector CT imaging of the chest was performed following the standard protocol without IV contrast.  COMPARISON:  CT, 01/07/2014.  Chest radiograph, 12/11/2013.  FINDINGS: Thoracic inlet: There is heterogeneous soft tissue surrounding the lower trachea consistent with heterogeneous enlargement of the thyroid gland. Margins of the gland are ill-defined. There are multiple prominent neck base lymph nodes the largest 1 cm in short axis. Partly imaged larynx shows some increased soft tissue in mild narrowing of the airway.  Mediastinum and hila: Heart is normal in size. There are mild to moderate coronary artery calcifications. There is dilation of the ascending aorta measuring 4.6 cm x 4.5 cm in diameter. The aorta is mildly dilated along the arch and proximal descending portion. There are diffuse atherosclerotic calcifications throughout the thoracic aorta.  The multiple small mediastinal lymph nodes. There are no pathologically enlarged lymph nodes. No mediastinal masses. No hilar masses or adenopathy.  Lungs and pleura: There are small, right greater than left, bilateral pleural effusions. There are coarse reticular opacities in the right upper lobe with intervening emphysematous cystic spaces and some intervening hazy ground-glass type opacity. 4 mm noncalcified pleural-based nodule noted in the right upper lobe, image 30, series 4. Calcified granuloma is noted in the right lower lobe peripherally. There is mild atelectasis adjacent to the pleural effusions in both lower lobes there is no pulmonary edema. No  pneumothorax. Mild emphysema is noted in the left upper lobe.  Limited upper abdomen: Dense upper abdominal aortic calcification and partly imaged proximal aspect of an aortic stent. Visualized portions of the liver, spleen, gallbladder and pancreas are unremarkable. No adrenal masses.  Musculoskeletal: There are lytic process cysts involving 3 discrete ribs, of the right posterior fourth rib, right posterior and lateral seventh rib and the left anterior and lateral fifth rib. The soft tissue mass surrounds the lytic process of the left antral lateral fifth rib. No other lytic lesions.  IMPRESSION: 1. There are coarse reticular opacities with intervening hazy ground-glass opacity and associated if edematous  cystic change in the right upper lobe. This could all be chronic. Acute infectious or inflammatory infiltrate is possible. 2. No discrete pulmonary mass. 4 mm pleural-based nodule noted in the peripheral right upper lobe. No other suspicious nodules 3. Small, right greater than left, pleural effusions with associated dependent atelectasis. No pulmonary edema. 4. Neck base abnormalities. Patient also underwent neck CT. There is ill-defined soft tissue that involves and surrounds the thyroid gland. The thyroid appears enlarged and heterogeneous. There are multiple prominent neck base lymph nodes. 5. There are lytic lesions involving 3 discrete ribs. The left fifth rib also has associated soft tissue mass. This is highly suspicious for metastatic disease to bone.   Electronically Signed   By: Lajean Manes M.D.   On: 07/03/2014 16:39   Mr Lumbar Spine Wo Contrast  07/04/2014   CLINICAL DATA:  Severe low back pain radiating into the right hip and leg. Symptoms for 1 year.  EXAM: MRI LUMBAR SPINE WITHOUT CONTRAST  TECHNIQUE: Multiplanar, multisequence MR imaging of the lumbar spine was performed. No intravenous contrast was administered.  COMPARISON:  CT abdomen and pelvis 01/07/2014. PET CT scan 08/17/2011.   FINDINGS: Multiple foci of abnormal marrow signal are identified post consistent with metastatic disease or less likely multiple myeloma. L4 is completely replaced by tumor. A large lesion is also identified and L5 eccentric to the right extending into the right pedicle. Multiple additional lesions are seen including the posterior right and left ilium and sacrum. Paraspinous soft tissue structures demonstrate abnormal signal to the right of the L3 vertebral body extending into the psoas musculature and posterior paraspinous musculature inferiorly to the level of L4-5. Epidural tumor is seen from L3 to L5 with areas of extension out of both the vertebral body and posterior elements. The conus medullaris is normal in signal and position. Imaged intra-abdominal contents show partial visualization of abdominal aortic aneurysm which is been repaired.  The T11-12 level is imaged in the sagittal plane only and negative.  T12-L1: Small central protrusion is identified without central canal or foraminal narrowing.  L1-2:  Negative.  L2-3: Shallow disc bulge to the left without central canal or foraminal narrowing.  L3-4: Epidural tumor on the right extends into the right foramen and right paravertebral space and encroaches on the right L3 root and descending right L4 root. The left foramen appears open. There is also disc bulge this level.  L4-5: Epidural tumor extends out of the vertebral body narrowing the central canal with bulky tumor extension into the right paravertebral space and foramen encroaching on the exiting right L4 root.  L5-S1: Large epidural tumor deposits are seen extending out of the right lamina of L5 causing marked narrowing of the central canal and right lateral recess with encroachment on the descending and exiting right L5 root. There is a shallow disc bulge at this level.  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 Most notably affected levels are from L3 to L5-S1 level.  Findings were discussed with Dr. Carmin Muskrat at the time of interpretation.   Electronically Signed   By: Inge Rise M.D.   On: 07/04/2014 10:02     EKG Interpretation   Date/Time:  Friday Jul 04 2014 09:33:40 EDT Ventricular Rate:  96 PR Interval:  143 QRS Duration: 92 QT Interval:  349 QTC Calculation: 441  R Axis:   79 Text Interpretation:  Sinus rhythm Borderline T abnormalities, anterior  leads Sinus rhythm T wave abnormality Abnormal ekg Confirmed by Carmin Muskrat  MD [phone]) on 07/04/2014 10:08:08 AM     After the initial evaluation I demonstrated patient's CT scan to him and his family members. I reviewed the outside hospitalization notes, including primary care visit yesterday. Subsequently I discussed patient's MRI findings with our radiologist as well.  I discussed patient's thyroid enlargement, phlegmon, with our ENT specialist.  12:00 PM I discussed the patient's case with our oncology team. Given the diffuse metastases, concern for neck swelling, patient will be admitted, with attempts for facilitating biopsy, initiation of indicated oncologic therapy.  I discussed all findings again with the patient and his son, daughter-in-law, they are aware of all findings, including MRI results with likely cancer spread into the lumbar spine.  The patient has new DVT, initial attack regulation was withheld pending oncologic evaluation, with consideration of need for biopsy to appropriately guide therapy. MDM  Patient presents with dyspnea, chest pain.  Notably, the patient had outpatient CT scan, MRI, both of which demonstrates likely notable progression of malignancy. Patient has no explicitly identified primary source. After discussion with oncology, and ENT, the patient was admitted to  the medicine team for further evaluation and management.    Carmin Muskrat, MD 07/04/14 [phone]

## 2014-07-05 ENCOUNTER — Inpatient Hospital Stay (HOSPITAL_COMMUNITY): Payer: BC Managed Care – PPO

## 2014-07-05 DIAGNOSIS — J432 Centrilobular emphysema: Secondary | ICD-10-CM

## 2014-07-05 DIAGNOSIS — J90 Pleural effusion, not elsewhere classified: Secondary | ICD-10-CM | POA: Diagnosis present

## 2014-07-05 LAB — COMPREHENSIVE METABOLIC PANEL
ALT: 34 U/L (ref 17–63)
ANION GAP: 8 (ref 5–15)
AST: 30 U/L (ref 15–41)
Albumin: 3 g/dL — ABNORMAL LOW (ref 3.5–5.0)
Alkaline Phosphatase: 68 U/L (ref 38–126)
BUN: 32 mg/dL — ABNORMAL HIGH (ref 6–20)
CHLORIDE: 108 mmol/L (ref 101–111)
CO2: 19 mmol/L — ABNORMAL LOW (ref 22–32)
CREATININE: 0.44 mg/dL — AB (ref 0.61–1.24)
Calcium: 8.5 mg/dL — ABNORMAL LOW (ref 8.9–10.3)
GFR calc Af Amer: >60 mL/min (ref >60)
GFR calc non Af Amer: >60 mL/min (ref >60)
GLUCOSE: 149 mg/dL — AB (ref 70–99)
Potassium: 4.4 mmol/L (ref 3.5–5.1)
Sodium: 135 mmol/L (ref 135–145)
Total Bilirubin: 1 mg/dL (ref 0.3–1.2)
Total Protein: 5.8 g/dL — ABNORMAL LOW (ref 6.5–8.1)

## 2014-07-05 LAB — PSA, TOTAL AND FREE
PSA, Free Pct: 23.3 %
PSA, Free: 0.21 ng/mL
Prostate Specific Ag, Serum: 0.9 ng/mL (ref 0.0–4.0)

## 2014-07-05 LAB — URINE CULTURE
Colony Count: NO GROWTH
Culture: NO GROWTH

## 2014-07-05 LAB — CBC
HCT: 31 % — ABNORMAL LOW (ref 39.0–52.0)
Hemoglobin: 10.3 g/dL — ABNORMAL LOW (ref 13.0–17.0)
MCH: 31.2 pg (ref 26.0–34.0)
MCHC: 33.2 g/dL (ref 30.0–36.0)
MCV: 93.9 fL (ref 78.0–100.0)
PLATELETS: 160 10*3/uL (ref 150–400)
RBC: 3.3 MIL/uL — ABNORMAL LOW (ref 4.22–5.81)
RDW: 13.9 % (ref 11.5–15.5)
WBC: 16.3 10*3/uL — AB (ref 4.0–10.5)

## 2014-07-05 LAB — GLUCOSE, CAPILLARY: GLUCOSE-CAPILLARY: 143 mg/dL — AB (ref 70–99)

## 2014-07-05 LAB — CEA: CEA: 1.1 ng/mL (ref 0.0–4.7)

## 2014-07-05 LAB — PROTIME-INR
INR: 1.5 — ABNORMAL HIGH (ref 0.00–1.49)
Prothrombin Time: 18.2 seconds — ABNORMAL HIGH (ref 11.6–15.2)

## 2014-07-05 LAB — T3: T3, Total: 348 ng/dL — ABNORMAL HIGH (ref 71–180)

## 2014-07-05 LAB — CANCER ANTIGEN 19-9: CA 19-9: 546 U/mL — ABNORMAL HIGH (ref 0–35)

## 2014-07-05 MED ORDER — TIOTROPIUM BROMIDE MONOHYDRATE 18 MCG IN CAPS
18.0000 ug | ORAL_CAPSULE | Freq: Every day | RESPIRATORY_TRACT | Status: DC
  Administered 2014-07-05: 18 ug via RESPIRATORY_TRACT
  Filled 2014-07-05: qty 5

## 2014-07-05 MED ORDER — BENAZEPRIL HCL 20 MG PO TABS
20.0000 mg | ORAL_TABLET | Freq: Every day | ORAL | Status: DC
  Administered 2014-07-05 – 2014-07-06 (×2): 20 mg via ORAL
  Filled 2014-07-05 (×2): qty 1

## 2014-07-05 MED ORDER — METOPROLOL TARTRATE 25 MG/10 ML ORAL SUSPENSION: 12.5000 mg | Freq: Two times a day (BID) | ORAL | Status: DC

## 2014-07-05 MED ORDER — METOPROLOL SUCCINATE ER 25 MG PO TB24
50.0000 mg | ORAL_TABLET | Freq: Every day | ORAL | Status: DC
  Administered 2014-07-05: 50 mg via ORAL
  Filled 2014-07-05: qty 2

## 2014-07-05 MED ORDER — IOHEXOL 300 MG/ML  SOLN
50.0000 mL | Freq: Once | INTRAMUSCULAR | Status: AC | PRN
  Administered 2014-07-05: 50 mL via ORAL

## 2014-07-05 MED ORDER — IOHEXOL 300 MG/ML  SOLN
100.0000 mL | Freq: Once | INTRAMUSCULAR | Status: AC | PRN
  Administered 2014-07-05: 100 mL via INTRAVENOUS

## 2014-07-05 MED ORDER — GUAIFENESIN ER 600 MG PO TB12
600.0000 mg | ORAL_TABLET | Freq: Two times a day (BID) | ORAL | Status: DC
  Administered 2014-07-05 – 2014-07-07 (×6): 600 mg via ORAL
  Filled 2014-07-05 (×6): qty 1

## 2014-07-05 MED ORDER — IPRATROPIUM-ALBUTEROL 0.5-2.5 (3) MG/3ML IN SOLN
3.0000 mL | Freq: Four times a day (QID) | RESPIRATORY_TRACT | Status: DC
  Administered 2014-07-05 – 2014-07-06 (×4): 3 mL via RESPIRATORY_TRACT
  Filled 2014-07-05 (×4): qty 3

## 2014-07-05 NOTE — Progress Notes (Signed)
[facility] PROGRESS NOTE  [name] [name] GNF:[id number] DOB: [birth year] DOA: 07/04/2014 PCP: [name], MD  Assessment/Plan: #1 metastatic disease CT scan and MRI of the L-spine showing multiple abnormalities consistent with metastatic malignancy. CT abdomen and pelvis showing pleural effusion right greater than left. Patient complaining of hip pain. Patient stated unable to get MRI of the head done secondary to significant pain. Patient needs biopsy. Will need to contact interventional radiology to see whether the able to get a biopsy however likely will not be able to be done until Monday. Oncology following.  #2 pleural effusion Noted on CT of the abdomen and pelvis. We'll consulted pulmonary for further evaluation and management for possible thoracentesis.  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  #4 dysphagia/shortness of breath Likely secondary to enlarged thyroid. Patient speaking in full sentences. Patient has been seen by ENT who feels there is no evidence of thyroid abscess or cellulitis. ENT recommending possible needle aspiration of left lobe of thyroid for cytology under ultrasound guidance. Will need to consult with IR for this. Barium swallow pending. Will advance diet to full liquids for now.  #5 Carious dentition Panorex pending. Will need a dental consultation.  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 Follow H&H.  #7 leukocytosis Likely reactive leukocytosis. WBC trending down. Patient has been pancultured and a pending. No need for an about except this time. Follow.  #8 epidural mass/spinal mass Patient has been started on steroids per oncology. Per oncology.  #9 hypertension Increase benazepril and Toprol. Follow and titrate as needed.  #10 history of rheumatoid arthritis Continue told Plaquenil and methotrexate.  #11 prophylaxis PPI for GI prophylaxis. Lovenox for DVT  prophylaxis.  Code Status: Full Family Communication: Updated patient and son at bedside. Disposition Plan: Remain the step down unit   Consultants:  Oncology: Dr. Marin Olp [id number]  ENT: Dr Erik Obey [id number]/41/3244  Procedures:  CT abdomen and pelvis 07/05/2014  Antibiotics:  None  HPI/Subjective: Patient states he's feeling better than he did on admission. She states unable to do MRI secondary to significant pain. Patient asking for solid diet. Patient with complaints of shortness of breath on minimal exertion.  Objective: Filed Vitals:   07/05/14 0800  BP: 165/70  Pulse: 122  Temp: 97.9 F (36.6 C)  Resp: 19    Intake/Output Summary (Last 24 hours) at 07/05/14 0959 Last data filed at 07/05/14 0900  Gross per 24 hour  Intake 1954.25 ml  Output    760 ml  Net 1194.25 ml   Filed Weights   07/04/14 1422 07/05/14 0400  Weight: 58.5 kg (128 lb 15.5 oz) 61 kg (134 lb 7.7 oz)    Exam:   General:  NAD  Cardiovascular: Tachycardia  Respiratory: Decreased breath sounds in the right base. No crackles. No rhonchi.  Abdomen: Soft, nontender, nondistended, positive bowel sounds.  Musculoskeletal: No clubbing cyanosis or edema.  Data Reviewed: Basic Metabolic Panel:  Recent Labs Lab 07/04/14 1154 07/04/14 1600 07/05/14 0347  NA 135  --  135  K 4.6  --  4.4  CL 105  --  108  CO2 21*  --  19*  GLUCOSE 99  --  149*  BUN 32*  --  32*  CREATININE 0.51* 0.56* 0.44*  CALCIUM 9.1  --  8.5*  MG  --  2.2  --    Liver Function Tests:  Recent Labs Lab 07/04/14 1154 07/05/14 0347  AST  34 30  ALT 32 34  ALKPHOS 66 68  BILITOT 1.1 1.0  PROT 5.8* 5.8*  ALBUMIN 3.2* 3.0*   No results for input(s): LIPASE, AMYLASE in the last 168 hours. No results for input(s): AMMONIA in the last 168 hours. CBC:  Recent Labs Lab 07/04/14 1154 07/04/14 1600 07/05/14 0347  WBC 20.0* 20.5* 16.3*  NEUTROABS 15.0*  --   --   HGB 10.4* 10.2* 10.3*  HCT 31.4* 30.3* 31.0*   MCV 94.9 94.7 93.9  PLT 162 152 160   Cardiac Enzymes: No results for input(s): CKTOTAL, CKMB, CKMBINDEX, TROPONINI in the last 168 hours. BNP (last 3 results) No results for input(s): BNP in the last 8760 hours.  ProBNP (last 3 results) No results for input(s): PROBNP in the last 8760 hours.  CBG:  Recent Labs Lab 07/05/14 0841  GLUCAP 143*    Recent Results (from the past 240 hour(s))  MRSA PCR Screening     Status: None   Collection Time: 07/04/14  2:42 PM  Result Value Ref Range Status   MRSA by PCR NEGATIVE NEGATIVE Final    Comment:        The GeneXpert MRSA Assay (FDA approved for NASAL specimens only), is one component of a comprehensive MRSA colonization surveillance program. It is not intended to diagnose MRSA infection nor to guide or monitor treatment for MRSA infections.      Studies: Ct Soft Tissue Neck W Contrast  07/03/2014   CLINICAL DATA:  Neck mass for 2 weeks. Increasing size of neck mass.  EXAM: CT NECK WITH CONTRAST  TECHNIQUE: Multidetector CT imaging of the neck was performed using the standard protocol following the bolus administration of intravenous contrast.  CONTRAST:  75 mL Isovue-300  COMPARISON:  PET-CT 08/17/2011.  Chest CT today.  FINDINGS: Pharynx and larynx: Cystic lesion is present in the body of the RIGHT mandible which may be odontogenic or represent metastatic disease. Oropharynx and nasopharynx appears patent.  Salivary glands: Parotid glands are within normal limits. Submandibular glands also appear within normal limits.  Thyroid: Massively enlarged thyroid gland with diffuse phlegmon around the gland. There is low attenuation in the central LEFT thyroid lobe that measures about 3.3 x 2.6 cm. This probably represents metastatic disease in a patient with primary malignancy.  Lymph nodes: Innumerable necrotic lymph nodes are present in the LEFT greater than RIGHT level 2 and level 3 nodal stations extending into the chest.  Vascular:  Carotid atherosclerosis. Diminutive LEFT internal jugular vein. There is a large thrombus in LEFT subclavian vein that extends to the margin of the LEFT brachiocephalic vein.  Limited intracranial: No gross acute abnormality.  Mastoids and visualized paranasal sinuses: Mastoid air cells are clear. Mucous retention cyst/ polyp in the LEFT maxillary sinus.  Skeleton: Cervical spine degenerative disease. No pathologic fractures.  Upper chest: Deferred to chest CT.  IMPRESSION: 1. Massive thyroid gland enlargement with adjacent phlegmon. LEFT-greater-than- RIGHT necrotic cervical lymphadenopathy. 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 Acute suppurative thyroiditis is in the differential considerations. 2. 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 3. Markedly carious dentition with osteolysis in the RIGHT mandibular body. This may be odontogenic or metastatic.   Electronically Signed   By: Dereck Ligas M.D.   On: 07/03/2014 16:56   Ct Chest Wo Contrast  07/03/2014   CLINICAL DATA:  F/u lung nodule pain left side with inspirationSob former smokerNo surgNo hx caOld fx'd ribs  EXAM: CT CHEST WITHOUT CONTRAST  TECHNIQUE: Multidetector CT imaging of the chest was performed following the standard protocol without IV contrast.  COMPARISON:  CT, 01/07/2014.  Chest radiograph, 12/11/2013.  FINDINGS: Thoracic inlet: There is heterogeneous soft tissue surrounding the lower trachea consistent with heterogeneous enlargement of the thyroid gland. Margins of the gland are ill-defined. There are multiple prominent neck base lymph nodes the largest 1 cm in short axis. Partly imaged larynx shows some increased soft tissue in mild narrowing of the airway.  Mediastinum and hila: Heart is normal in size. There are mild to moderate coronary artery calcifications.  There is dilation of the ascending aorta measuring 4.6 cm x 4.5 cm in diameter. The aorta is mildly dilated along the arch and proximal descending portion. There are diffuse atherosclerotic calcifications throughout the thoracic aorta.  The multiple small mediastinal lymph nodes. There are no pathologically enlarged lymph nodes. No mediastinal masses. No hilar masses or adenopathy.  Lungs and pleura: There are small, right greater than left, bilateral pleural effusions. There are coarse reticular opacities in the right upper lobe with intervening emphysematous cystic spaces and some intervening hazy ground-glass type opacity. 4 mm noncalcified pleural-based nodule noted in the right upper lobe, image 30, series 4. Calcified granuloma is noted in the right lower lobe peripherally. There is mild atelectasis adjacent to the pleural effusions in both lower lobes there is no pulmonary edema. No pneumothorax. Mild emphysema is noted in the left upper lobe.  Limited upper abdomen: Dense upper abdominal aortic calcification and partly imaged proximal aspect of an aortic stent. Visualized portions of the liver, spleen, gallbladder and pancreas are unremarkable. No adrenal masses.  Musculoskeletal: There are lytic process cysts involving 3 discrete ribs, of the right posterior fourth rib, right posterior and lateral seventh rib and the left anterior and lateral fifth rib. The soft tissue mass surrounds the lytic process of the left antral lateral fifth rib. No other lytic lesions.  IMPRESSION: 1. There are coarse reticular opacities with intervening hazy ground-glass opacity and associated if edematous cystic change in the right upper lobe. This could all be chronic. Acute infectious or inflammatory infiltrate is possible. 2. No discrete pulmonary mass. 4 mm pleural-based nodule noted in the peripheral right upper lobe. No other suspicious nodules 3. Small, right greater than left, pleural effusions with associated dependent  atelectasis. No pulmonary edema. 4. Neck base abnormalities. Patient also underwent neck CT. There is ill-defined soft tissue that involves and surrounds the thyroid gland. The thyroid appears enlarged and heterogeneous. There are multiple prominent neck base lymph nodes. 5. There are lytic lesions involving 3 discrete ribs. The left fifth rib also has associated soft tissue mass. This is highly suspicious for metastatic disease to bone.   Electronically Signed   By: Lajean Manes M.D.   On: 07/03/2014 16:39   Mr Lumbar Spine Wo Contrast  07/04/2014   CLINICAL DATA:  Severe low back pain radiating into the right hip and leg. Symptoms for 1 year.  EXAM: MRI LUMBAR SPINE WITHOUT CONTRAST  TECHNIQUE: Multiplanar, multisequence MR imaging of the lumbar spine was performed. No intravenous contrast was administered.  COMPARISON:  CT abdomen and pelvis 01/07/2014. PET CT scan 08/17/2011.  FINDINGS: Multiple foci of abnormal marrow signal are identified post consistent with metastatic disease or less likely multiple myeloma. L4 is completely replaced by tumor. A large lesion is also identified and  L5 eccentric to the right extending into the right pedicle. Multiple additional lesions are seen including the posterior right and left ilium and sacrum. Paraspinous soft tissue structures demonstrate abnormal signal to the right of the L3 vertebral body extending into the psoas musculature and posterior paraspinous musculature inferiorly to the level of L4-5. Epidural tumor is seen from L3 to L5 with areas of extension out of both the vertebral body and posterior elements. The conus medullaris is normal in signal and position. Imaged intra-abdominal contents show partial visualization of abdominal aortic aneurysm which is been repaired.  The T11-12 level is imaged in the sagittal plane only and negative.  T12-L1: Small central protrusion is identified without central canal or foraminal narrowing.  L1-2:  Negative.  L2-3:  Shallow disc bulge to the left without central canal or foraminal narrowing.  L3-4: Epidural tumor on the right extends into the right foramen and right paravertebral space and encroaches on the right L3 root and descending right L4 root. The left foramen appears open. There is also disc bulge this level.  L4-5: Epidural tumor extends out of the vertebral body narrowing the central canal with bulky tumor extension into the right paravertebral space and foramen encroaching on the exiting right L4 root.  L5-S1: Large epidural tumor deposits are seen extending out of the right lamina of L5 causing marked narrowing of the central canal and right lateral recess with encroachment on the descending and exiting right L5 root. There is a shallow disc bulge at this level.  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 Most notably affected levels are from L3 to L5-S1 level.  Findings were discussed with Dr. Carmin Muskrat at the time of interpretation.   Electronically Signed   By: Inge Rise M.D.   On: 07/04/2014 10:02   Ct Abdomen Pelvis W Contrast  07/05/2014   CLINICAL DATA:  Continued staging of newly diagnosed bone lesions. WBC=16.3*  EXAM: CT ABDOMEN AND PELVIS WITH CONTRAST  TECHNIQUE: Multidetector CT imaging of the abdomen and pelvis was performed using the standard protocol following bolus administration of intravenous contrast.  CONTRAST:  25m OMNIPAQUE IOHEXOL 300 MG/ML SOLN, 1059mOMNIPAQUE IOHEXOL 300 MG/ML SOLN  COMPARISON:  01/07/2014  FINDINGS: Moderate right small left pleural effusions. There is significant left right lower lobe atelectasis. No pulmonary edema. Heart is normal in size. No lung base mass or discrete nodule.  Liver, spleen, gallbladder, pancreas, adrenal glands:  Unremarkable.  Small  low-density renal lesions, most likely cysts. Relative area of decreased enhancement in the inferior medial aspect the left kidney, which is stable from the prior CT. Small cysts have increased in size since the prior study. No other renal abnormality. No hydronephrosis. Normal ureters. Bladder is unremarkable.  Excluded infrarenal abdominal aortic aneurysm. Native aneurysm measures 6.2 cm x 4.9 cm transversely. 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 These findings are relatively stable.  No pathologically enlarged lymph nodes.  No ascites.  Colon and small bowel are unremarkable. Appendix not convincingly seen.  Degenerative changes noted of the lumbar spine. There is a mottled appearance of vertebrae, but no discrete osteoblastic or osteolytic lesion. The widespread lesions evident on MRI are not well resolved high CT.  IMPRESSION: 1. No acute finding within the abdomen pelvis. 2. No extraosseous metastatic disease or evidence of a  primary neoplasm. 3. 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 The visible vertebrae show a subtle heterogeneous pattern without a discrete osteoblastic or osteolytic lesion. 4. Moderate right and small to moderate left pleural effusions with significant associated lower lobe atelectasis. No lung base nodules.   Electronically Signed   By: Lajean Manes M.D.   On: 07/05/2014 09:39    Scheduled Meds: . benazepril  20 mg Oral Daily  . budesonide-formoterol  2 puff Inhalation BID  . dexamethasone  20 mg Intravenous Q24H  . docusate sodium  100 mg Oral BID  . enoxaparin (LOVENOX) injection  1 mg/kg Subcutaneous Q12H  . metoprolol succinate  50 mg Oral Daily  . pamidronate  90 mg Intravenous Once  . sodium chloride  3 mL Intravenous Q12H  . tiotropium  18 mcg Inhalation Daily   Continuous Infusions: . sodium chloride      Principal Problem:   Metastatic cancer Active Problems:   Rheumatoid  arthritis   Hypertension   Lung nodule   Pain of right lower extremity-Lower Back and  Legs   Weakness-Bilateral Leg and Right arm > Left arm   Thrombosis of left subclavian vein   Abnormal thyroid exam   Leukocytosis   Dysphagia   SOB (shortness of breath)   Thyroid enlargement   Poor dentition   Epidural mass   Right paraspinous mass   Rib lesion   Dehydration   Anemia   Metastasis    Time spent: 31 minutes    Jaymes Hang M.D. [facility] Pager [phone]. If 7PM-7AM, please contact night-coverage at [email or web address], password [facility] 07/05/2014, 9:59 AM  LOS: 1 day

## 2014-07-05 NOTE — Evaluation (Signed)
Clinical/Bedside Swallow Evaluation Patient Details  Name: [name] [name] MRN: [id number] Date of Birth: Jan 17, 1941  Today's Date: 07/05/2014 Time: SLP Start Time (ACUTE ONLY): 7829 SLP Stop Time (ACUTE ONLY): 1511 SLP Time Calculation (min) (ACUTE ONLY): 28 min  Past Medical History:  Past Medical History  Diagnosis Date  . Lung nodule 2012  . Insomnia     takes Restoril nightly as needed  . Hypertension     takes Benazepril and Metoprolol daily  . Pneumonia     hx of;many yrs ago  . AAA (abdominal aortic aneurysm)   . Joint pain   . Joint swelling   . Urinary frequency     takes Terazosin daily  . Urinary urgency   . Nocturia   . Enlarged prostate     slightly  . Rheumatoid arthritis(714.0)     takes Plaquenil daily and Methotrexate weekly   Past Surgical History:  Past Surgical History  Procedure Laterality Date  . Inguinal hernia repair  2008  . Appendectomy    . Abdominal aortic endovascular stent graft Bilateral 12/11/2013    Procedure: ABDOMINAL AORTIC ENDOVASCULAR STENT GRAFT; left groin exploration;  Surgeon: Rosetta Posner, MD;  Location: [facility] OR;  Service: Vascular;  Laterality: Bilateral;   HPI:  74 year old male with a known diagnosis of COPD who came to the hospital because of some weight loss, shortness of breath, and back pain. He was found to have CT imaging findings suggestive of widespread metastasis from an uncertain primary malignancy.  Pt was admitted for further workup with oncology and ENT per MD notes.  Per CT neck, pt with massive enlargement of thyroid lesion (left lobe) with phlegmon/inflammation 07/03/14.   Difficulty swallowing food more than drink reported by pt -progressing within the last few weeks.  Pt states swelling in neck present x1 month but progressed in 2 weeks.     Assessment / Plan / Recommendation Clinical Impression  Pt presents with negative CN exam, suspect dysphagia is secondary to known massive thyroid gland enlargement likely causing  mechanical deficits.  Pt accepted only a single swallow of Sprite - swallow was timely = multiple swallows noted and no indication of airway compromise.  He politely declined to consume other intake due to concerns for dysphagia- pointing to pharynx.    As pt is acutely aware of his dysphagia and exercises caution, recommend pt consume diet as tolerated.  Advised pt to strategies to mitigate current dysphagia symptoms - including asking for liquid suspension for medications or dissolved with liquids if not contraindicated.   Note plan for pt to undergo esophagram.  SLP to sign off as ENT on board and plan in place re: dysphagia.  Hopeful for improvement with swallowing with medical/?surgical/?radiographic tx.     Please reorder if SLP if desired for compensation strategies, etc.      Aspiration Risk  Mild    Diet Recommendation  (as tolerated)   Medication Administration: Via alternative means (? liquid suspension) Compensations: Multiple dry swallows after each bite/sip    Other  Recommendations Oral Care Recommendations: Oral care BID   Follow Up Recommendations    n/a   Frequency and Duration   n/a     Pertinent Vitals/Pain Afebrile, decreased     Swallow Study Prior Functional Status   see hhx    General Date of Onset: 07/05/14 Other Pertinent Information: 74 year old male with a known diagnosis of COPD who came to the hospital because of some weight loss, shortness of breath,  and back pain. He was found to have CT imaging findings suggestive of widespread metastasis from an uncertain primary malignancy.  Pt was admitted for further workup with oncology and ENT per MD notes.  Per CT neck, pt with massive enlargement of thyroid lesion (left lobe) with phlegmon/inflammation 07/03/14.   Difficulty swallowing food more than drink reported by pt -progressing within the last few weeks.  Pt states swelling in neck present x1 month but progressed in 2 weeks.   Type of Study: Bedside  swallow evaluation Diet Prior to this Study: Thin liquids (full liquids) Temperature Spikes Noted: No Respiratory Status: Room air History of Recent Intubation: No Behavior/Cognition: Alert;Cooperative;Pleasant mood Oral Cavity - Dentition: Adequate natural dentition/normal for age (appearance of some caries) Self-Feeding Abilities: Able to feed self Patient Positioning: Upright in bed Baseline Vocal Quality: Normal Volitional Cough: Strong Volitional Swallow: Able to elicit    Oral/Motor/Sensory Function Overall Oral Motor/Sensory Function: Appears within functional limits for tasks assessed   Ice Chips Ice chips: Not tested   Thin Liquid Thin Liquid: Impaired Presentation: Straw Pharyngeal  Phase Impairments: Multiple swallows    Nectar Thick Nectar Thick Liquid: Not tested   Honey Thick Honey Thick Liquid: Not tested   Puree Puree: Not tested   Solid   GO    Solid: Not tested       Luanna Salk, [facility] SLP [phone]

## 2014-07-05 NOTE — Progress Notes (Signed)
Patient unable to tolerate MRI procedure, test not preformed

## 2014-07-05 NOTE — Evaluation (Signed)
SLP Cancellation Note  Patient Details Name: [name] [name] MRN: [id number] DOB: 1940/12/21   Cancelled treatment:       Reason Eval/Treat Not Completed:  (pt undergoing breathing tx at this time)   Luanna Salk, [place] [facility] SLP [phone]

## 2014-07-05 NOTE — Consult Note (Signed)
PULMONARY / CRITICAL CARE MEDICINE   Name: [name] [name] MRN: [id number] DOB: 01/04/1941    ADMISSION DATE:  07/04/2014 CONSULTATION DATE:  07/04/2014  REFERRING MD :  Grandville Silos  CHIEF COMPLAINT:  Weight loss, abnormal CT scan  INITIAL PRESENTATION:  74 y/o male admitted from the outpatient clinic after a CT scan showed multiple abnormalities suggestive of metastatic malignancy. Pulmonary critical care was consulted on May 7 for pleural effusions.  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  SIGNIFICANT EVENTS:   HISTORY OF PRESENT ILLNESS: This is a pleasant 74 year old male with a known diagnosis of COPD who came to the hospital because of some weight loss, shortness of breath, and back pain. He was found to have CT imaging findings suggestive of widespread metastasis from an uncertain primary malignancy. He has yet to have a tissue diagnosis. He has been admitted for further workup and oncology as well as ENT have both seen him. He has been anticoagulated because of clots that were seen in his left IJ and left subclavian. He tells me that he does feel like he  cannot take a deep breath and he is not getting enough air. On 2 separate occasions in the last 2 days he has coughed up a large mucous plug. He denies fevers or chills.  PAST MEDICAL HISTORY :   has a past medical history of Lung nodule (2012); Insomnia; Hypertension; Pneumonia; AAA (abdominal aortic aneurysm); Joint pain; Joint swelling; Urinary frequency; Urinary urgency; Nocturia; Enlarged prostate; and Rheumatoid arthritis(714.0).  has past surgical history that includes Inguinal hernia repair (2008); Appendectomy; and Abdominal aortic endovascular stent graft (Bilateral, 12/11/2013). Prior to Admission medications   Medication Sig Start Date End Date Taking? Authorizing Provider  benazepril (LOTENSIN) 10 MG tablet Take 10 mg by mouth daily.   Yes Historical Provider, MD  Calcium 600-200 MG-UNIT per tablet Take 1 tablet by mouth daily.    Yes Historical Provider, MD  Cholecalciferol (D 2000) 2000 UNITS TABS Take 1 tablet by mouth daily.    Yes Historical Provider, MD  fish oil-omega-3 fatty acids 1000 MG capsule Take 1 g by mouth daily.   Yes Historical Provider, MD  folic acid (FOLVITE) 1 MG tablet Take 1 mg by mouth daily.   Yes Historical Provider, MD  hydroxychloroquine (PLAQUENIL) 200 MG tablet Take 200 mg by mouth 2 (two) times daily.   Yes Historical Provider, MD  ibuprofen (ADVIL,MOTRIN) 200 MG tablet Take 200 mg by mouth every 4 (four) hours as needed for moderate pain.   Yes Historical Provider, MD  methotrexate 2.5 MG tablet Take 17.5 mg by mouth every 7 (seven) days. Pt states he either takes it on Friday or Saturday.   Yes Historical Provider, MD  metoprolol succinate (TOPROL-XL) 25 MG 24 hr tablet Take 25 mg  by mouth daily.   Yes Historical Provider, MD  Misc Natural Products (OSTEO BI-FLEX JOINT SHIELD) TABS Take 1 tablet by mouth daily.    Yes Historical Provider, MD  Multiple Vitamin (MULTIVITAMIN) tablet Take 1 tablet by mouth daily.   Yes Historical Provider, MD  vitamin E  (VITAMIN E) 400 UNIT capsule Take 400 Units by mouth 2 (two) times daily.    Yes Historical Provider, MD  zolpidem (AMBIEN) 10 MG tablet Take 10 mg by mouth at bedtime as needed for sleep.   Yes Historical Provider, MD  doxycycline (VIBRA-TABS) 100 MG tablet  11/27/13   Historical Provider, MD  temazepam (RESTORIL) 15 MG capsule Take 1 capsule (15 mg total) by mouth at bedtime as needed for sleep. 02/28/11 03/30/11  Neena Rhymes, MD  traMADol (ULTRAM) 50 MG tablet Take 1 tablet (50 mg total) by mouth every 6 (six) hours as needed for moderate pain. Patient not taking: Reported on 07/04/2014 12/12/13   Hulen Shouts Rhyne, PA-C   Allergies  Allergen Reactions  . Other     Catfish + IV pain medication (unknown name)-vomits    FAMILY HISTORY:  indicated that his mother is deceased. He indicated that his father is deceased.  SOCIAL HISTORY:  reports that he has quit smoking. His smoking use included Cigarettes. He has never used smokeless tobacco. He reports that he does not drink alcohol or use illicit drugs.  REVIEW OF SYSTEMS:   Gen: Denies fever, chills, + weight change, fatigue, night sweats HEENT: Denies blurred vision, double vision, hearing loss, tinnitus, sinus congestion, rhinorrhea, sore throat, neck stiffness, dysphagia PULM: per HPI CV: Denies chest pain, edema, orthopnea, paroxysmal nocturnal dyspnea, palpitations GI: Denies abdominal pain, nausea, vomiting, diarrhea, hematochezia, melena, constipation, change in bowel habits GU: Denies dysuria, hematuria, polyuria, oliguria, urethral discharge Endocrine: Denies hot or cold intolerance, polyuria, polyphagia or appetite change Derm: Denies rash, dry skin, scaling or peeling skin change Heme: Denies easy bruising, bleeding, bleeding gums Neuro: Denies headache, numbness, weakness, slurred speech, loss of memory or consciousness   SUBJECTIVE:   VITAL SIGNS: Temp:  [97.8 F (36.6 C)-100 F (37.8 C)] 97.9 F (36.6 C) (05/07  0800) Pulse Rate:  [99-130] 122 (05/07 0800) Resp:  [14-24] 19 (05/07 0800) BP: (165-200)/(63-97) 165/70 mmHg (05/07 0800) SpO2:  [92 %-98 %] 94 % (05/07 0924) Weight:  [128 lb 15.5 oz (58.5 kg)-134 lb 7.7 oz (61 kg)] 134 lb 7.7 oz (61 kg) (05/07 0400) HEMODYNAMICS:   VENTILATOR SETTINGS:   INTAKE / OUTPUT:  Intake/Output Summary (Last 24 hours) at 07/05/14 1140 Last data filed at 07/05/14 0900  Gross per 24 hour  Intake 1954.25 ml  Output    760 ml  Net 1194.25 ml    PHYSICAL EXAMINATION: General:  Lying comfortably in bed Neuro:  Awake and alert, moves all 4 extremities HEENT:  NCAT, oropharynx clear, neck swelling and firmness noted Cardiovascular:  Regular rate and rhythm no murmurs gallops rubs Lungs:  Clear to auscultation, no rhonchi or wheezing, slightly diminished in bases normal effort Abdomen:  Bowel sounds are positive nontender nondistended Musculoskeletal:  Normal bulk and tone Skin:  No rash or skin breakdown  LABS:  CBC  Recent Labs Lab 07/04/14 1154 07/04/14 1600 07/05/14 0347  WBC 20.0* 20.5* 16.3*  HGB 10.4* 10.2* 10.3*  HCT 31.4* 30.3* 31.0*  PLT 162 152 160   Coag's  Recent Labs Lab 07/04/14 1154 07/05/14 0347  INR 1.39 1.50*   BMET  Recent Labs  Lab 07/04/14 1154 07/04/14 1600 07/05/14 0347  NA 135  --  135  K 4.6  --  4.4  CL 105  --  108  CO2 21*  --  19*  BUN 32*  --  32*  CREATININE 0.51* 0.56* 0.44*  GLUCOSE 99  --  149*   Electrolytes  Recent Labs Lab 07/04/14 1154 07/04/14 1600 07/05/14 0347  CALCIUM 9.1  --  8.5*  MG  --  2.2  --    Sepsis Markers No results for input(s): LATICACIDVEN, PROCALCITON, O2SATVEN in the last 168 hours. ABG No results for input(s): PHART, PCO2ART, PO2ART in the last 168 hours. Liver Enzymes  Recent Labs Lab 07/04/14 1154 07/05/14 0347  AST 34 30  ALT 32 34  ALKPHOS 66 68  BILITOT 1.1 1.0  ALBUMIN 3.2* 3.0*   Cardiac Enzymes No results for input(s): TROPONINI, PROBNP in  the last 168 hours. Glucose  Recent Labs Lab 07/05/14 0841  GLUCAP 143*    Imaging Mr Lumbar Spine Wo Contrast  07/04/2014   CLINICAL DATA:  Severe low back pain radiating into the right hip and leg. Symptoms for 1 year.  EXAM: MRI LUMBAR SPINE WITHOUT CONTRAST  TECHNIQUE: Multiplanar, multisequence MR imaging of the lumbar spine was performed. No intravenous contrast was administered.  COMPARISON:  CT abdomen and pelvis 01/07/2014. PET CT scan 08/17/2011.  FINDINGS: Multiple foci of abnormal marrow signal are identified post consistent with metastatic disease or less likely multiple myeloma. L4 is completely replaced by tumor. A large lesion is also identified and L5 eccentric to the right extending into the right pedicle. Multiple additional lesions are seen including the posterior right and left ilium and sacrum. Paraspinous soft tissue structures demonstrate abnormal signal to the right of the L3 vertebral body extending into the psoas musculature and posterior paraspinous musculature inferiorly to the level of L4-5. Epidural tumor is seen from L3 to L5 with areas of extension out of both the vertebral body and posterior elements. The conus medullaris is normal in signal and position. Imaged intra-abdominal contents show partial visualization of abdominal aortic aneurysm which is been repaired.  The T11-12 level is imaged in the sagittal plane only and negative.  T12-L1: Small central protrusion is identified without central canal or foraminal narrowing.  L1-2:  Negative.  L2-3: Shallow disc bulge to the left without central canal or foraminal narrowing.  L3-4: Epidural tumor on the right extends into the right foramen and right paravertebral space and encroaches on the right L3 root and descending right L4 root. The left foramen appears open. There is also disc bulge this level.  L4-5: Epidural tumor extends out of the vertebral body narrowing the central canal with bulky tumor extension into the  right paravertebral space and foramen encroaching on the exiting right L4 root.  L5-S1: Large epidural tumor deposits are seen extending out of the right lamina of L5 causing marked narrowing of the central canal and right lateral recess with encroachment on the descending and exiting right L5 root. There is a shallow disc bulge at this level.  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 Most notably affected levels are from L3 to L5-S1 level.  Findings were discussed with Dr. Carmin Muskrat at the time of interpretation.   Electronically Signed   By: Inge Rise M.D.   On:  07/04/2014 10:02   Oncology and otolaryngology notes reviewed  ASSESSMENT / PLAN:  PULMONARY  A: Emphysema seen on CT chest (images personally reviewed) consistent with diagnosis of COPD, not an exacerbation Small bilateral pleural effusions> uncertain etiology, could be malignant, based on the extent of the disease seen in his thyroid gland and surrounding lymph nodes as well as in the bony metastases I think a higher yield test would be to biopsy the clearly abnormal lymph nodes before performing pleural fluid cytology P:   Will need to hold anticoagulation before performing a thoracentesis, however as stated above I think a higher yield test would be to biopsy the lymph nodes Will add Mucinex and a flutter valve for his chronic bronchitis symptoms Will follow pleural effusions with daily chest x-ray if increase in size or worsening shortness of breath and will perform a thoracentesis Hold Spiriva today and add DuoNeb's 4 times a day to try to help with mucociliary clearance  Roselie Awkward, MD [place] Pager: [phone] Cell: [phone] If no response, call [phone]   07/05/2014, 11:40 AM

## 2014-07-06 ENCOUNTER — Inpatient Hospital Stay (HOSPITAL_COMMUNITY): Payer: BC Managed Care – PPO

## 2014-07-06 DIAGNOSIS — S065X9A Traumatic subdural hemorrhage with loss of consciousness of unspecified duration, initial encounter: Secondary | ICD-10-CM | POA: Clinically undetermined

## 2014-07-06 DIAGNOSIS — S065XAA Traumatic subdural hemorrhage with loss of consciousness status unknown, initial encounter: Secondary | ICD-10-CM | POA: Clinically undetermined

## 2014-07-06 DIAGNOSIS — E0590 Thyrotoxicosis, unspecified without thyrotoxic crisis or storm: Secondary | ICD-10-CM | POA: Diagnosis present

## 2014-07-06 DIAGNOSIS — R221 Localized swelling, mass and lump, neck: Secondary | ICD-10-CM | POA: Diagnosis present

## 2014-07-06 DIAGNOSIS — M899 Disorder of bone, unspecified: Secondary | ICD-10-CM

## 2014-07-06 LAB — CALCIUM, IONIZED: CALCIUM, IONIZED, SERUM: 5 mg/dL (ref 4.5–5.6)

## 2014-07-06 LAB — CBC
HCT: 29.3 % — ABNORMAL LOW (ref 39.0–52.0)
Hemoglobin: 9.6 g/dL — ABNORMAL LOW (ref 13.0–17.0)
MCH: 31.2 pg (ref 26.0–34.0)
MCHC: 32.8 g/dL (ref 30.0–36.0)
MCV: 95.1 fL (ref 78.0–100.0)
Platelets: 182 10*3/uL (ref 150–400)
RBC: 3.08 MIL/uL — ABNORMAL LOW (ref 4.22–5.81)
RDW: 14.1 % (ref 11.5–15.5)
WBC: 26 10*3/uL — ABNORMAL HIGH (ref 4.0–10.5)

## 2014-07-06 LAB — GLUCOSE, CAPILLARY: GLUCOSE-CAPILLARY: 102 mg/dL — AB (ref 70–99)

## 2014-07-06 LAB — BASIC METABOLIC PANEL
ANION GAP: 8 (ref 5–15)
BUN: 37 mg/dL — ABNORMAL HIGH (ref 6–20)
CHLORIDE: 110 mmol/L (ref 101–111)
CO2: 19 mmol/L — ABNORMAL LOW (ref 22–32)
Calcium: 8.4 mg/dL — ABNORMAL LOW (ref 8.9–10.3)
Creatinine, Ser: 0.47 mg/dL — ABNORMAL LOW (ref 0.61–1.24)
GFR calc non Af Amer: >60 mL/min (ref >60)
Glucose, Bld: 104 mg/dL — ABNORMAL HIGH (ref 70–99)
Potassium: 3.8 mmol/L (ref 3.5–5.1)
Sodium: 137 mmol/L (ref 135–145)

## 2014-07-06 MED ORDER — METOPROLOL TARTRATE 25 MG/10 ML ORAL SUSPENSION
50.0000 mg | Freq: Two times a day (BID) | ORAL | Status: DC
  Administered 2014-07-06 – 2014-07-07 (×4): 50 mg via ORAL
  Filled 2014-07-06 (×7): qty 20

## 2014-07-06 MED ORDER — GADOBENATE DIMEGLUMINE 529 MG/ML IV SOLN
13.0000 mL | Freq: Once | INTRAVENOUS | Status: AC | PRN
  Administered 2014-07-06: 12 mL via INTRAVENOUS

## 2014-07-06 MED ORDER — ACETAMINOPHEN 500 MG PO TABS
500.0000 mg | ORAL_TABLET | Freq: Three times a day (TID) | ORAL | Status: DC
  Administered 2014-07-06 – 2014-07-18 (×37): 500 mg via ORAL
  Filled 2014-07-06 (×43): qty 1

## 2014-07-06 MED ORDER — ONDANSETRON HCL 4 MG PO TABS: 4.0000 mg | ORAL_TABLET | ORAL | Status: DC | PRN

## 2014-07-06 MED ORDER — ONDANSETRON HCL 4 MG/2ML IJ SOLN
4.0000 mg | INTRAMUSCULAR | Status: DC | PRN
  Administered 2014-07-09 – 2014-07-14 (×3): 4 mg via INTRAVENOUS
  Filled 2014-07-06 (×3): qty 2

## 2014-07-06 MED ORDER — METHIMAZOLE 10 MG PO TABS
60.0000 mg | ORAL_TABLET | Freq: Every day | ORAL | Status: DC
  Administered 2014-07-06 – 2014-07-18 (×13): 60 mg via ORAL
  Filled 2014-07-06 (×14): qty 6

## 2014-07-06 MED ORDER — LEVALBUTEROL HCL 0.63 MG/3ML IN NEBU
0.6300 mg | INHALATION_SOLUTION | RESPIRATORY_TRACT | Status: DC | PRN
  Filled 2014-07-06: qty 3

## 2014-07-06 MED ORDER — DIPHENHYDRAMINE HCL 25 MG PO CAPS
25.0000 mg | ORAL_CAPSULE | Freq: Once | ORAL | Status: AC
  Administered 2014-07-06: 25 mg via ORAL
  Filled 2014-07-06: qty 1

## 2014-07-06 MED ORDER — HYDRALAZINE HCL 20 MG/ML IJ SOLN
10.0000 mg | Freq: Four times a day (QID) | INTRAMUSCULAR | Status: DC | PRN
  Administered 2014-07-06 – 2014-07-09 (×3): 10 mg via INTRAVENOUS
  Filled 2014-07-06 (×4): qty 1

## 2014-07-06 MED ORDER — BENAZEPRIL HCL 40 MG PO TABS
40.0000 mg | ORAL_TABLET | Freq: Every day | ORAL | Status: DC
  Administered 2014-07-07 – 2014-07-18 (×12): 40 mg via ORAL
  Filled 2014-07-06 (×13): qty 1

## 2014-07-06 MED ORDER — TIOTROPIUM BROMIDE MONOHYDRATE 18 MCG IN CAPS
18.0000 ug | ORAL_CAPSULE | Freq: Every day | RESPIRATORY_TRACT | Status: DC
  Administered 2014-07-06 – 2014-07-08 (×3): 18 ug via RESPIRATORY_TRACT
  Filled 2014-07-06: qty 5

## 2014-07-06 MED ORDER — METOPROLOL SUCCINATE ER 25 MG PO TB24
75.0000 mg | ORAL_TABLET | Freq: Every day | ORAL | Status: DC
  Filled 2014-07-06: qty 3

## 2014-07-06 MED ORDER — OXYCODONE HCL ER 15 MG PO T12A
15.0000 mg | EXTENDED_RELEASE_TABLET | Freq: Two times a day (BID) | ORAL | Status: DC
  Administered 2014-07-06 – 2014-07-18 (×25): 15 mg via ORAL
  Filled 2014-07-06 (×24): qty 1

## 2014-07-06 MED ORDER — OXYCODONE HCL 5 MG PO TABS
5.0000 mg | ORAL_TABLET | ORAL | Status: DC | PRN
  Administered 2014-07-06 – 2014-07-10 (×6): 10 mg via ORAL
  Administered 2014-07-10: 5 mg via ORAL
  Administered 2014-07-14 – 2014-07-17 (×6): 10 mg via ORAL
  Filled 2014-07-06: qty 2
  Filled 2014-07-06: qty 1
  Filled 2014-07-06 (×13): qty 2

## 2014-07-06 MED ORDER — BENAZEPRIL HCL 20 MG PO TABS
20.0000 mg | ORAL_TABLET | Freq: Once | ORAL | Status: AC
  Administered 2014-07-06: 20 mg via ORAL
  Filled 2014-07-06: qty 1

## 2014-07-06 NOTE — Evaluation (Signed)
Physical Therapy Evaluation Patient Details Name: [name] [name] MRN: [id number] DOB: 10/01/1940 Today's Date: 07/06/2014   History of Present Illness  Metastatic CA 74 year old male with a known diagnosis of COPD who came to the hospital because of some weight loss, shortness of breath, and back pain. He was found to have CT imaging findings suggestive of widespread metastasis from an uncertain primary malignancy. Pt was admitted for further workup with oncology and ENT per MD notes  Clinical Impression  Pt admitted with metastatic CA and presents with functional mobility limitations 2* generalized weakness, pain and balance deficits.  Pt hopes to progress to dc home with intermittent assist of family but may need to consider SNF placement dependent on acute stay progress and level of assist that can be arranged at son's home.   Follow Up Recommendations Home health PT;SNF (Dependent on acute stay progress and assist at home)    Equipment Recommendations  None recommended by PT    Recommendations for Other Services OT consult     Precautions / Restrictions Precautions Precautions: Fall Precaution Comments: Pt reports R LE weakness, pain and intermittent numbness in foot Restrictions Weight Bearing Restrictions: No      Mobility  Bed Mobility Overal bed mobility: Needs Assistance;+2 for physical assistance;+ 2 for safety/equipment Bed Mobility: Supine to Sit;Sit to Supine     Supine to sit: Mod assist;+2 for physical assistance;+2 for safety/equipment Sit to supine: Mod assist;+2 for physical assistance;+2 for safety/equipment   General bed mobility comments: Assist to manage BIl LEs and trunk  Transfers Overall transfer level: Needs assistance Equipment used: Rolling walker (2 wheeled) Transfers: Sit to/from Stand Sit to Stand: Mod assist;+2 physical assistance;+2 safety/equipment         General transfer comment: cues for transition position and use of UEs to self  assist  Ambulation/Gait Ambulation/Gait assistance: Min assist;Mod assist;+2 physical assistance;+2 safety/equipment Ambulation Distance (Feet): 4 Feet Assistive device: Rolling walker (2 wheeled) Gait Pattern/deviations: Step-to pattern;Decreased step length - right;Decreased step length - left;Shuffle Gait velocity: decr   General Gait Details: Pt able to side step up side of bed  Stairs            Wheelchair Mobility    Modified Rankin (Stroke Patients Only)       Balance Overall balance assessment: Needs assistance Sitting-balance support: Feet supported;Bilateral upper extremity supported Sitting balance-Leahy Scale: Fair     Standing balance support: Bilateral upper extremity supported Standing balance-Leahy Scale: Poor                               Pertinent Vitals/Pain Pain Assessment: 0-10 Pain Score: 4  Pain Location: R hip/leg and Bil ribcage (L>R) Pain Descriptors / Indicators: Aching;Sore Pain Intervention(s): Limited activity within patient's tolerance;Monitored during session;Premedicated before session    Home Living Family/patient expects to be discharged to:: Private residence Living Arrangements: Children Available Help at Discharge: Available PRN/intermittently;Family Type of Home: House Home Access: Stairs to enter Entrance Stairs-Rails: None Entrance Stairs-Number of Steps: 3 Home Layout: One level Home Equipment: Walker - 2 wheels Additional Comments: Pt recently moved to son's home with assist primarily available in evenings    Prior Function Level of Independence: Independent with assistive device(s)         Comments: Pt has been using RW past weeks and staying at son's house.  Son and DIL available evenings to assist     Hand Dominance  Extremity/Trunk Assessment   Upper Extremity Assessment: Generalized weakness           Lower Extremity Assessment: Generalized weakness;RLE deficits/detail          Communication   Communication: No difficulties  Cognition Arousal/Alertness: Awake/alert Behavior During Therapy: WFL for tasks assessed/performed Overall Cognitive Status: Within Functional Limits for tasks assessed                      General Comments      Exercises        Assessment/Plan    PT Assessment Patient needs continued PT services  PT Diagnosis Difficulty walking   PT Problem List Decreased strength;Decreased range of motion;Decreased activity tolerance;Decreased balance;Decreased mobility;Decreased knowledge of use of DME;Pain  PT Treatment Interventions DME instruction;Gait training;Stair training;Functional mobility training;Therapeutic activities;Therapeutic exercise;Patient/family education   PT Goals (Current goals can be found in the Care Plan section) Acute Rehab PT Goals Patient Stated Goal: Be able to ambulate and care for self as much as possible PT Goal Formulation: With patient Time For Goal Achievement: 07/20/14 Potential to Achieve Goals: Fair    Frequency Min 3X/week   Barriers to discharge Decreased caregiver support Pt recently moved in with son and DIL but they are primarily available to assist in evenings only    Co-evaluation               End of Session Equipment Utilized During Treatment: Gait belt Activity Tolerance: Patient tolerated treatment well;Patient limited by fatigue Patient left: in bed;with call bell/phone within reach Nurse Communication: Mobility status         Time: 2703-5009 PT Time Calculation (min) (ACUTE ONLY): 38 min   Charges:   PT Evaluation $Initial PT Evaluation Tier I: 1 Procedure PT Treatments $Therapeutic Activity: 23-37 mins   PT G Codes:        Darral Rishel 2014-07-29, 5:44 PM

## 2014-07-06 NOTE — Progress Notes (Signed)
PULMONARY / CRITICAL CARE MEDICINE   Name: [name] [name] MRN: [id number] DOB: [birth year]    ADMISSION DATE:  07/04/2014 CONSULTATION DATE:  07/04/2014  REFERRING MD :  Grandville Silos  CHIEF COMPLAINT:  Weight loss, abnormal CT scan  INITIAL PRESENTATION:  74 y/o male admitted from the outpatient clinic after a CT scan showed multiple abnormalities suggestive of metastatic malignancy. Pulmonary critical care was consulted on May 7 for pleural effusions.  STUDIES:  07/03/2014 CT chest> coarse reticular opacities with hazy groundglass in the right upper lobe, no discrete pulmonary mass, 4 mm pleural-based nodule, small right greater than left pleural effusions with dependent atelectasis, neck base abnormalities ill-defined soft tissue that it surrounds the thyroid, thyroid appears heterogeneous, multiple prominent lymph nodes, lytic lesions in the ribs noted May 5,2016 CT neck> massive thyroid gland enlargement with adjacent phlegmon, left greater than right necrotic cervical lymphadenopathy most compatible with metastatic disease to the thyroid gland, thrombus of the left subclavian left brachiocephalic vein 56/21/3086 CT abdomen> no extraosseous metastatic disease or evidence of primary neoplasm in the abdomen, skeletal metastatic disease noted on prior MRI is not well-defined on CT, moderate right and small effusions with atelectasis.  SIGNIFICANT EVENTS:   SUBJECTIVE:  Feels OK today  VITAL SIGNS: Temp:  [97.8 F (36.6 C)-98.9 F (37.2 C)] 98.6 F (37 C) (05/08 0800) Pulse Rate:  [101-121] 112 (05/08 1000) Resp:  [15-28] 28 (05/08 1000) BP: (130-185)/(58-115) 173/78 mmHg (05/08 1000) SpO2:  [92 %-98 %] 96 % (05/08 1000) HEMODYNAMICS:   VENTILATOR SETTINGS:   INTAKE / OUTPUT:  Intake/Output Summary (Last 24 hours) at 07/06/14 1144 Last data filed at 07/06/14 1000  Gross per 24 hour  Intake   2660 ml  Output   1000 ml  Net   1660 ml    PHYSICAL EXAMINATION: General:  Lying  comfortably in bed Neuro:  Awake and alert, moves all 4 extremities HEENT:  NCAT, oropharynx clear, neck swelling and firmness noted Cardiovascular:  Regular rate and rhythm no murmurs gallops rubs Lungs:  Clear to auscultation, no rhonchi or wheezing, slightly diminished in bases normal effort Abdomen:  Bowel sounds are positive nontender nondistended Musculoskeletal:  Normal bulk and tone Skin:  No rash or skin breakdown  LABS:  CBC  Recent Labs Lab 07/04/14 1600 07/05/14 0347 07/06/14 0405  WBC 20.5* 16.3* 26.0*  HGB 10.2* 10.3* 9.6*  HCT 30.3* 31.0* 29.3*  PLT 152 160 182   Coag's  Recent Labs Lab 07/04/14 1154 07/05/14 0347  INR 1.39 1.50*   BMET  Recent Labs Lab 07/04/14 1154 07/04/14 1600 07/05/14 0347 07/06/14 0405  NA 135  --  135 137  K 4.6  --  4.4 3.8  CL 105  --  108 110  CO2 21*  --  19* 19*  BUN 32*  --  32* 37*  CREATININE 0.51* 0.56* 0.44* 0.47*  GLUCOSE 99  --  149* 104*   Electrolytes  Recent Labs Lab 07/04/14 1154 07/04/14 1600 07/05/14 0347 07/06/14 0405  CALCIUM 9.1  --  8.5* 8.4*  MG  --  2.2  --   --    Sepsis Markers No results for input(s): LATICACIDVEN, PROCALCITON, O2SATVEN in the last 168 hours. ABG No results for input(s): PHART, PCO2ART, PO2ART in the last 168 hours. Liver Enzymes  Recent Labs Lab 07/04/14 1154 07/05/14 0347  AST 34 30  ALT 32 34  ALKPHOS 66 68  BILITOT 1.1 1.0  ALBUMIN 3.2* 3.0*   Cardiac Enzymes  No results for input(s): TROPONINI, PROBNP in the last 168 hours. Glucose  Recent Labs Lab 07/05/14 0841 07/06/14 0755  GLUCAP 143* 102*    Imaging CXR images personally reviewed> unchanged bilateral effusions Oncology and otolaryngology notes reviewed  ASSESSMENT / PLAN:  PULMONARY  A:  Centrilobular emphysema/COPD, not in exacerbation  Small bilateral pleural effusions> uncertain etiology, could be malignant  Thyroid mass and metastatic malignancy, given rapid growth there is  some concern for lymphoma; of note, the patient had pulmonary nodules which were stable on serial imaging at [facility] through 2015. P:   Hold on thoracentesis for now Plan thyroid vs lymph node biopsy tomorrow Continue mucinex and flutter valve Will change back to spiriva and prn xopenex given thyroiditis and sinus tach  PCCM will sign off, call if questions  Roselie Awkward, MD [place] [facility] Pager: [phone] Cell: [phone] If no response, call [phone]   07/06/2014, 11:44 AM

## 2014-07-06 NOTE — Consult Note (Addendum)
Reason for Consult: Hyperthyroidism and thyroid mass. Referring Physician: Dr. Darlina Sicilian AARONJAMES KELSAY is an 74 y.o. male.   HPI:   Mr. Meister describes how multiple pulmonary nodules were discovered during a chest x-ray obtained 3 years ago for "a lung problem".  These were stable on annual evaluations for the past 3 years. The next evaluation was planned for 08/10/2014.   He reports a history of tobacco use.   About 2 months ago, he started experiencing dull discomfort in his chest wall without apparent precipitating or contributing factor.  Within the past few months, he started noticing a bulge in his anterior neck [he points to his thyroid gland].  It particularly worsened over the past 2 weeks.  This is causing difficulty swallowing pills. Associated hoarseness. No previous diagnosis of any thyroid condition.  No known family history of thyroid disorders.  In recent months, he also has experienced pain along his right thigh. He attributed this to "a spine problem". Corticosteroid injection did not help. 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   Past Medical History  Diagnosis Date  . Lung nodule 2012  . Insomnia     takes Restoril nightly as needed  . Hypertension     takes Benazepril and Metoprolol daily  . Pneumonia     hx of;many yrs ago  . AAA (abdominal aortic aneurysm)   . Joint pain   . Joint swelling   . Urinary frequency     takes Terazosin daily  . Urinary urgency   . Nocturia   . Enlarged prostate     slightly  . Rheumatoid arthritis(714.0)     takes Plaquenil daily and Methotrexate weekly    Past Surgical History  Procedure Laterality Date  . Inguinal hernia repair  2008  . Appendectomy    . Abdominal aortic endovascular stent graft Bilateral 12/11/2013    Procedure: ABDOMINAL AORTIC ENDOVASCULAR STENT GRAFT; left groin exploration;  Surgeon: Rosetta Posner, MD;  Location: [facility] OR;  Service: Vascular;  Laterality:  Bilateral;    Family History  Problem Relation Age of Onset  . Hypertension Father   . Stroke Father   . Hypertension Mother   . Diabetes Mother   . Hyperlipidemia Mother     Social History:  reports that he has quit smoking. His smoking use included Cigarettes. He has never used smokeless tobacco. He reports that he does not drink alcohol or use illicit drugs.  Allergies:  Allergies  Allergen Reactions  . Other     Catfish + IV pain medication (unknown name)-vomits    Medications: I have reviewed the patient's current medications.  ROS  General: Weight loss of 15 pounds, no weight gain. Endocrine:  No cold intolerance, but heat intolerance. Neck:  Hoarseness and difficulty swallowing. Cardiovascular: Palpitations, no edema. Respiratory: No dyspnea. Gastrointestinal: Nausea with vomiting has resolved, no diarrhea, no constipation. Neurologic: No tremor, no confusion. Psychiatric: Insomnia, but no anxiety Hematologic/lymphatic: No abnormal bleeding or bruising. Musculoskeletal: No muscle weakness, no muscle aches.   Blood pressure 173/78, pulse 112, temperature 98.6 F (37 C), temperature source Oral, resp. rate 28, height '5\' 7"'$  (1.702 m), weight 61 kg (134 lb 7.7 oz), SpO2 96 %. Physical Exam  General: No apparent distress. Eyes: Anicteric, no scleral show, minimal (if any) periorbital puffiness, no lid lag. Thyroid: Asymmetric enlargement with excessive firmness, but not tender.   Cardiovascular: Rapid but regular, no murmur, normal radial pulses. Respiratory:  Normal respiratory effort, clear to auscultation. Gastrointestinal: Normal pitch active bowel sounds, nontender abdomen without distention or appreciable hepatomegaly. Neurologic: Cranial nerves normal as tested, deep tendon reflexes 1-2+, no tremor. Musculoskeletal: Normal muscle tone, mild muscle atrophy. Skin: Appropriate warmth, no visible rash but ecchymoses. Mental status: Alert, conversant, speech clear,  thought logical, appropriate mood and affect, no hallucinations or delusions evident. Hematologic/lymphatic: Cervical adenopathy, no jaundice.  Dg Chest 2 View  07/06/2014   CLINICAL DATA:  Followup bilateral pleural effusions.  EXAM: CHEST  2 VIEW  COMPARISON:  12/11/2013 and chest CT dated 07/03/2014.  FINDINGS: Borderline enlarged cardiac silhouette. Small to moderate-sized bilateral pleural effusions with previously demonstrated associated atelectasis. 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 Left shoulder degenerative changes.  IMPRESSION: 1. Stable bilateral pleural effusions and associated atelectasis. 2. Stable changes of COPD and borderline cardiomegaly.   Electronically Signed   By: Claudie Revering M.D.   On: 07/06/2014 09:24   Ct Abdomen Pelvis W Contrast  07/05/2014   CLINICAL DATA:  Continued staging of newly diagnosed bone lesions. WBC=16.3*  EXAM: CT ABDOMEN AND PELVIS WITH CONTRAST  TECHNIQUE: Multidetector CT imaging of the abdomen and pelvis was performed using the standard protocol following bolus administration of intravenous contrast.  CONTRAST:  32m OMNIPAQUE IOHEXOL 300 MG/ML SOLN, 1019mOMNIPAQUE IOHEXOL 300 MG/ML SOLN  COMPARISON:  01/07/2014  FINDINGS: Moderate right small left pleural effusions. There is significant left right lower lobe atelectasis. No pulmonary edema. Heart is normal in size. No lung base mass or discrete nodule.  Liver, spleen, gallbladder, pancreas, adrenal glands:  Unremarkable.  Small low-density renal lesions, most likely cysts. Relative area of decreased enhancement in the inferior medial aspect the left kidney, which is stable from the prior CT. Small cysts have increased in size since the prior study. No other renal abnormality. No hydronephrosis. Normal ureters. Bladder is unremarkable.  Excluded infrarenal abdominal aortic aneurysm. Native  aneurysm measures 6.2 cm x 4.9 cm transversely. 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 These findings are relatively stable.  No pathologically enlarged lymph nodes.  No ascites.  Colon and small bowel are unremarkable. Appendix not convincingly seen.  Degenerative changes noted of the lumbar spine. There is a mottled appearance of vertebrae, but no discrete osteoblastic or osteolytic lesion. The widespread lesions evident on MRI are not well resolved high CT.  IMPRESSION: 1. No acute finding within the abdomen pelvis. 2. No extraosseous metastatic disease or evidence of a primary neoplasm. 3. 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 The visible vertebrae show a subtle heterogeneous pattern without a discrete osteoblastic or osteolytic lesion. 4. Moderate right and small to moderate left pleural effusions with significant associated lower lobe atelectasis. No lung base nodules.   Electronically Signed   By: DaLajean Manes.D.   On: 07/05/2014 09:39   Lab Results  Component Value Date   TSH 0.016* 07/04/2014   FREET4 5.65* 07/04/2014   T3TOTAL 348* 07/04/2014   WBC 20.9* 07/07/2014   EOSABS 0.1 07/07/2014   NEUTROABS 17.7* 07/07/2014   HGB 8.8* 07/07/2014   PLT 172 07/07/2014   MCV 94.6 07/07/2014   NA 136 07/07/2014   K 4.2 07/07/2014   GLUCOSE 122* 07/07/2014   CALCIUM 8.1* 07/07/2014   ALBUMIN 3.0* 07/05/2014   AST 30 07/05/2014   ALT 34 07/05/2014   ALKPHOS  68 07/05/2014   BILITOT 1.0 07/05/2014     Assessment/Plan: 1.  Hyperthyroidism (differential diagnoses include Graves' disease or transient thyrotoxicosis from Hashimoto's thyroiditis; exogenous thyroid hormone exposure, toxic thyroid adenoma, or ectopic thyroid hormone production by functioning differentiated thyroid carcinoma seem less likely) 2.  Thyroid mass (differential diagnoses include lymphoma, metastasis to thyroid gland, differentiated thyroid  carcinomas, and medullary thyroid carcinoma). 3.  Elevated eosinophil count (differential diagnoses include rheumatoid arthritis, neoplasia, or less likely possibility of adrenal insufficiency).    Education given, including handouts from the American Thyroid Association website for the following topics:  Thyroid nodules, Hyperthyroidism.  I recommend checking thyroglobulin level.  A low thyroglobulin level may occur with exogenous thyroid hormone exposure (such as from thyroid gland mixed with beef, levothyroxine therapy not reported, thyroid gland supplements, etc.).  A moderately high thyroglobulin level may occur with many conditions including thyroiditis.  A markedly high thyroglobulin level might indicate a thyroid carcinoma with skeletal metastasis.  I also recommend checking calcitonin level.  Medullary thyroid carcinoma seems unlikely however based on the history and the normal CEA level.    I spoke with Drs. Grandville Silos and [place] today.  I also reviewed his ECG report (normal sinus rhythm on 07/04/2014), ECG images, CT scan of neck report, CT scan of neck images, laboratory reports, and hospital notes.  I agree with Drs. Ennever, Wolicki, and Grinnell.  Thyroid gland mass or cervical lymphadenopathy should be the easiest source of a specimen for the pathologists to review.    His thyroid mass developed rather quickly.  For this reason, we need to consider the possibility of a lymphoma.  Therefore if ultrasound-guided fine needle biopsy is ordered, then include flow cytometry (or whatever approach the pathologist or Dr. Marin Olp recommend).  Consider removing an intact lymph node if the initial test is not conclusive.    There is no clinical evidence of thyroid storm at this time.  Clinically and biochemically he has severe hyperthyroidism.  I recommend checking thyroid stimulating immunoglobulin and thyroid peroxidase antibodies, while starting methimazole at 60 mg once a day by mouth.  Due to his  tachycardia and other findings, I recommend starting methimazole now while awaiting the laboratory results.  If the laboratory tests and other evaluations are not conclusive, we can later hold the methimazole and obtain nuclear medicine thyroid uptake/scan.  Dr. Grandville Silos is managing beta-blocker therapy to help protect Mr. Love from some of the effects of thyrotoxicosis.  The Decadron might also help by reducing inflammation in the thyroid gland and reducing the conversion of T4 to T3 (the active thyroid hormone).    Informed consent obtained after discussion of anticipated benefits, alternatives, nature of therapy, and risks--including but not limited to rash, allergic reaction, bone marrow suppression, liver damage, etc. from methimazole.  We also discussed the nature of test, alternatives, and risks of ultrasound-guided needle biopsies (including but not limited to pain, bleeding, scar, infection, false positive, false-negative, or indeterminate result).   I am ordering the laboratory tests that I recommended and the methimazole.  Dr. Grandville Silos is managing the beta-blocker therapy.  I will leave the imaging orders and interventional radiology orders to Dr. Grandville Silos.  Jeron Grahn 07/06/2014, 12:04 PM

## 2014-07-06 NOTE — Progress Notes (Signed)
Subdural hematoma Was called by radiologist Dr. Nevada Crane about MRI findings concerning for subdural hematoma. Spoke with Dr. Arnoldo Morale of neurosurgery who reviewed the films and had recommended discontinuing patient's Lovenox which was being given for subclavian thrombus and repeating the head CT scan in the morning. Per neurosurgery if head CT scan shows progression of subdural hematoma then to call them back for formal consult. Lovenox has been discontinued. Follow.

## 2014-07-06 NOTE — Progress Notes (Signed)
[facility] PROGRESS NOTE  [name] [name] MHD:[id number] DOB: Jun 24, 1940 DOA: 07/04/2014 PCP: [name], MD  Assessment/Plan: #1 metastatic disease CT scan and MRI of the L-spine showing multiple abnormalities consistent with metastatic malignancy. CT abdomen and pelvis showing pleural effusion right greater than left. Patient complaining of hip pain. Patient stated unable to get MRI of the head done secondary to significant pain. Patient needs biopsy. WIll consult IR to look at films to find the most accessible area to biopsy. Anticoagulation will need to be held prior to biopsy.  Oncology following.  #2 pleural effusion Noted on CT of the abdomen and pelvis. Patient has been seen by pulmonary and recommend following pleural effusions with daily chest x-rays and if increases in size or worsening shortness of breath that a thoracentesis will need to be performed. Patient has been placed on DuoNeb's to help with mucociliary clearance as well as Mucinex and a flutter valve. Pulmonary following.  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 May need to hold Lovenox in anticipation of possible biopsy however will defer to IR.  #4 dysphagia/shortness of breath Likely secondary to enlarged thyroid. Patient speaking in full sentences. Patient has been seen by ENT who feels there is no evidence of thyroid abscess or cellulitis. ENT recommending possible needle aspiration of left lobe of thyroid for cytology under ultrasound guidance. Will need to consult with IR for this. Barium swallow pending. Continue full liquids for now.  #5 hyperthyroidism/enlarged thyroid Questionable etiology. Patient here with concern for metastatic disease. Thyroid function studies with a TSH of 0.016. Free T4-5 0.65. Total T3 of 348. Will change patient's Toprol to Lopressor 50 mg twice daily for better rate control. Will get a thyroid ultrasound. Will consult with endocrinology for  further evaluation and management. Follow.  #6 Carious dentition Panorex pending. Will need a dental consultation.  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 Follow H&H.  #8 leukocytosis Likely reactive leukocytosis and steriod induced. Leukocytosis worsened likely by steriods. Repeat UA and follow. Patient has been pancultured and results pending. No need for antibiotics at this time. Follow.  #9 epidural mass/spinal mass Patient has been started on steroids per oncology. Per oncology.  #10 hypertension Continue benazepril. Change toprol to lopressor and start at '50mg'$  BID for better rate control. Follow and titrate as needed.  #11 history of rheumatoid arthritis Continue to hold Plaquenil and methotrexate.  #12 prophylaxis PPI for GI prophylaxis. Lovenox for DVT prophylaxis.  Code Status: Full Family Communication: Updated patient, no family at bedside. Disposition Plan: Remain the step down unit   Consultants:  Oncology: Dr. Marin Olp 21/19/4174  ENT: Dr Erik Obey 10/11/4816  Procedures:  CT abdomen and pelvis 07/05/2014  Antibiotics:  None  HPI/Subjective: Patient states pain medication helping somewhat.States some SOB. Still with some dysphagia.  Objective: Filed Vitals:   07/06/14 0800  BP:   Pulse:   Temp: 98.6 F (37 C)  Resp:     Intake/Output Summary (Last 24 hours) at 07/06/14 0955 Last data filed at 07/06/14 0700  Gross per 24 hour  Intake   2260 ml  Output   1000 ml  Net   1260 ml   Filed Weights   07/04/14 1422 07/05/14 0400  Weight: 58.5 kg (128 lb 15.5 oz) 61 kg (134 lb 7.7 oz)    Exam:   General:  NAD  Cardiovascular: Tachycardia  Respiratory: Decreased breath sounds in the right base. No crackles. No rhonchi.  Abdomen:  Soft, nontender, nondistended, positive bowel sounds.  Musculoskeletal: No clubbing cyanosis or edema.  Data Reviewed: Basic Metabolic Panel:  Recent Labs Lab 07/04/14 1154 07/04/14 1600  07/05/14 0347 07/06/14 0405  NA 135  --  135 137  K 4.6  --  4.4 3.8  CL 105  --  108 110  CO2 21*  --  19* 19*  GLUCOSE 99  --  149* 104*  BUN 32*  --  32* 37*  CREATININE 0.51* 0.56* 0.44* 0.47*  CALCIUM 9.1  --  8.5* 8.4*  MG  --  2.2  --   --    Liver Function Tests:  Recent Labs Lab 07/04/14 1154 07/05/14 0347  AST 34 30  ALT 32 34  ALKPHOS 66 68  BILITOT 1.1 1.0  PROT 5.8* 5.8*  ALBUMIN 3.2* 3.0*   No results for input(s): LIPASE, AMYLASE in the last 168 hours. No results for input(s): AMMONIA in the last 168 hours. CBC:  Recent Labs Lab 07/04/14 1154 07/04/14 1600 07/05/14 0347 07/06/14 0405  WBC 20.0* 20.5* 16.3* 26.0*  NEUTROABS 15.0*  --   --   --   HGB 10.4* 10.2* 10.3* 9.6*  HCT 31.4* 30.3* 31.0* 29.3*  MCV 94.9 94.7 93.9 95.1  PLT 162 152 160 182   Cardiac Enzymes: No results for input(s): CKTOTAL, CKMB, CKMBINDEX, TROPONINI in the last 168 hours. BNP (last 3 results) No results for input(s): BNP in the last 8760 hours.  ProBNP (last 3 results) No results for input(s): PROBNP in the last 8760 hours.  CBG:  Recent Labs Lab 07/05/14 0841 07/06/14 0755  GLUCAP 143* 102*    Recent Results (from the past 240 hour(s))  Culture, blood (routine x 2)     Status: None (Preliminary result)   Collection Time: 07/04/14  1:54 PM  Result Value Ref Range Status   Specimen Description BLOOD LEFT ANTECUBITAL  Final   Special Requests BOTTLES DRAWN AEROBIC AND ANAEROBIC 5 CC EA  Final   Culture   Final           BLOOD CULTURE RECEIVED NO GROWTH TO DATE CULTURE WILL BE HELD FOR 5 DAYS BEFORE ISSUING A FINAL NEGATIVE REPORT Performed at [facility]    Report Status PENDING  Incomplete  Culture, blood (routine x 2)     Status: None (Preliminary result)   Collection Time: 07/04/14  1:55 PM  Result Value Ref Range Status   Specimen Description BLOOD BLOOD RIGHT FOREARM  Final   Special Requests BOTTLES DRAWN AEROBIC AND ANAEROBIC 5 CC EA  Final    Culture   Final           BLOOD CULTURE RECEIVED NO GROWTH TO DATE CULTURE WILL BE HELD FOR 5 DAYS BEFORE ISSUING A FINAL NEGATIVE REPORT Performed at [facility]    Report Status PENDING  Incomplete  Culture, Urine     Status: None   Collection Time: 07/04/14  1:59 PM  Result Value Ref Range Status   Specimen Description URINE, RANDOM  Final   Special Requests NONE  Final   Colony Count NO GROWTH Performed at [facility]   Final   Culture NO GROWTH Performed at [facility]   Final   Report Status 07/05/2014 FINAL  Final  MRSA PCR Screening     Status: None   Collection Time: 07/04/14  2:42 PM  Result Value Ref Range Status   MRSA by PCR NEGATIVE NEGATIVE Final  Comment:        The GeneXpert MRSA Assay (FDA approved for NASAL specimens only), is one component of a comprehensive MRSA colonization surveillance program. It is not intended to diagnose MRSA infection nor to guide or monitor treatment for MRSA infections.      Studies: Dg Chest 2 View  07/06/2014   CLINICAL DATA:  Followup bilateral pleural effusions.  EXAM: CHEST  2 VIEW  COMPARISON:  12/11/2013 and chest CT dated 07/03/2014.  FINDINGS: Borderline enlarged cardiac silhouette. Small to moderate-sized bilateral pleural effusions with previously demonstrated associated atelectasis. 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 Left shoulder degenerative changes.  IMPRESSION: 1. Stable bilateral pleural effusions and associated atelectasis. 2. Stable changes of COPD and borderline cardiomegaly.   Electronically Signed   By: Claudie Revering M.D.   On: 07/06/2014 09:24   Ct Abdomen Pelvis W Contrast  07/05/2014   CLINICAL DATA:  Continued staging of newly diagnosed bone lesions. WBC=16.3*  EXAM: CT ABDOMEN AND PELVIS WITH CONTRAST  TECHNIQUE: Multidetector CT imaging of the abdomen  and pelvis was performed using the standard protocol following bolus administration of intravenous contrast.  CONTRAST:  23m OMNIPAQUE IOHEXOL 300 MG/ML SOLN, 1020mOMNIPAQUE IOHEXOL 300 MG/ML SOLN  COMPARISON:  01/07/2014  FINDINGS: Moderate right small left pleural effusions. There is significant left right lower lobe atelectasis. No pulmonary edema. Heart is normal in size. No lung base mass or discrete nodule.  Liver, spleen, gallbladder, pancreas, adrenal glands:  Unremarkable.  Small low-density renal lesions, most likely cysts. Relative area of decreased enhancement in the inferior medial aspect the left kidney, which is stable from the prior CT. Small cysts have increased in size since the prior study. No other renal abnormality. No hydronephrosis. Normal ureters. Bladder is unremarkable.  Excluded infrarenal abdominal aortic aneurysm. Native aneurysm measures 6.2 cm x 4.9 cm transversely. 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 These findings are relatively stable.  No pathologically enlarged lymph nodes.  No ascites.  Colon and small bowel are unremarkable. Appendix not convincingly seen.  Degenerative changes noted of the lumbar spine. There is a mottled appearance of vertebrae, but no discrete osteoblastic or osteolytic lesion. The widespread lesions evident on MRI are not well resolved high CT.  IMPRESSION: 1. No acute finding within the abdomen pelvis. 2. No extraosseous metastatic disease or evidence of a primary neoplasm. 3. 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 The visible vertebrae show a subtle heterogeneous pattern without a discrete osteoblastic or osteolytic lesion. 4. Moderate right and small to moderate left pleural effusions with significant associated lower lobe atelectasis. No lung base nodules.   Electronically Signed   By: DaLajean Manes.D.   On: 07/05/2014 09:39    Scheduled Meds: . benazepril  20 mg Oral  Daily  . budesonide-formoterol  2 puff Inhalation BID  . dexamethasone  20 mg Intravenous Q24H  . docusate sodium  100 mg Oral BID  . enoxaparin (LOVENOX) injection  1 mg/kg Subcutaneous Q12H  . guaiFENesin  600 mg Oral BID  . ipratropium-albuterol  3 mL Nebulization Q6H  . metoprolol succinate  75 mg Oral Daily  . sodium chloride  3 mL Intravenous Q12H   Continuous Infusions: . sodium chloride 100 mL/hr at 07/05/14 2215    Principal Problem:   Metastatic cancer Active Problems:   Rheumatoid arthritis  Hypertension   Lung nodule   Pain of right lower extremity-Lower Back and  Legs   Weakness-Bilateral Leg and Right arm > Left arm   Thrombosis of left subclavian vein   Abnormal thyroid exam   Leukocytosis   Dysphagia   SOB (shortness of breath)   Thyroid enlargement   Poor dentition   Epidural mass   Right paraspinous mass   Rib lesion   Dehydration   Anemia   Metastasis   Pleural effusion   Hyperthyroidism    Time spent: 71 minutes    THOMPSON,DANIEL M.D. [facility] Pager [phone]. If 7PM-7AM, please contact night-coverage at [email or web address], password [facility] 07/06/2014, 9:55 AM  LOS: 2 days

## 2014-07-07 ENCOUNTER — Inpatient Hospital Stay (HOSPITAL_COMMUNITY): Payer: BC Managed Care – PPO

## 2014-07-07 ENCOUNTER — Encounter (HOSPITAL_COMMUNITY): Payer: Self-pay | Admitting: Radiology

## 2014-07-07 ENCOUNTER — Other Ambulatory Visit: Payer: Self-pay | Admitting: Radiation Oncology

## 2014-07-07 ENCOUNTER — Ambulatory Visit
Admit: 2014-07-07 | Discharge: 2014-07-07 | Disposition: A | Discharge status: Home / Self Care | Payer: BC Managed Care – PPO | Attending: Radiation Oncology | Admitting: Radiation Oncology

## 2014-07-07 DIAGNOSIS — I6200 Nontraumatic subdural hemorrhage, unspecified: Secondary | ICD-10-CM

## 2014-07-07 DIAGNOSIS — E0590 Thyrotoxicosis, unspecified without thyrotoxic crisis or storm: Secondary | ICD-10-CM

## 2014-07-07 DIAGNOSIS — R930 Abnormal findings on diagnostic imaging of skull and head, not elsewhere classified: Secondary | ICD-10-CM

## 2014-07-07 LAB — CBC WITH DIFFERENTIAL/PLATELET
Basophils Absolute: 0 10*3/uL (ref 0.0–0.1)
Basophils Relative: 0 % (ref 0–1)
EOS PCT: 0 % (ref 0–5)
Eosinophils Absolute: 0.1 10*3/uL (ref 0.0–0.7)
HCT: 26.3 % — ABNORMAL LOW (ref 39.0–52.0)
HEMOGLOBIN: 8.8 g/dL — AB (ref 13.0–17.0)
LYMPHS ABS: 1.2 10*3/uL (ref 0.7–4.0)
Lymphocytes Relative: 6 % — ABNORMAL LOW (ref 12–46)
MCH: 31.7 pg (ref 26.0–34.0)
MCHC: 33.5 g/dL (ref 30.0–36.0)
MCV: 94.6 fL (ref 78.0–100.0)
MONOS PCT: 9 % (ref 3–12)
Monocytes Absolute: 2 10*3/uL — ABNORMAL HIGH (ref 0.1–1.0)
Neutro Abs: 17.7 10*3/uL — ABNORMAL HIGH (ref 1.7–7.7)
Neutrophils Relative %: 85 % — ABNORMAL HIGH (ref 43–77)
Platelets: 172 10*3/uL (ref 150–400)
RBC: 2.78 MIL/uL — AB (ref 4.22–5.81)
RDW: 14.1 % (ref 11.5–15.5)
WBC: 20.9 10*3/uL — ABNORMAL HIGH (ref 4.0–10.5)

## 2014-07-07 LAB — GLUCOSE, CAPILLARY: Glucose-Capillary: 106 mg/dL — ABNORMAL HIGH (ref 70–99)

## 2014-07-07 LAB — BASIC METABOLIC PANEL
ANION GAP: 8 (ref 5–15)
BUN: 31 mg/dL — ABNORMAL HIGH (ref 6–20)
CHLORIDE: 110 mmol/L (ref 101–111)
CO2: 18 mmol/L — ABNORMAL LOW (ref 22–32)
Calcium: 8.1 mg/dL — ABNORMAL LOW (ref 8.9–10.3)
Creatinine, Ser: 0.49 mg/dL — ABNORMAL LOW (ref 0.61–1.24)
GFR calc non Af Amer: >60 mL/min (ref >60)
Glucose, Bld: 122 mg/dL — ABNORMAL HIGH (ref 70–99)
POTASSIUM: 4.2 mmol/L (ref 3.5–5.1)
SODIUM: 136 mmol/L (ref 135–145)

## 2014-07-07 LAB — LACTATE DEHYDROGENASE: LDH: 295 U/L — ABNORMAL HIGH (ref 98–192)

## 2014-07-07 LAB — PROTIME-INR
INR: 1.49 (ref 0.00–1.49)
Prothrombin Time: 18.2 seconds — ABNORMAL HIGH (ref 11.6–15.2)

## 2014-07-07 LAB — T3, REVERSE: T3, Reverse: 117 ng/dL — ABNORMAL HIGH (ref 8–25)

## 2014-07-07 LAB — PREALBUMIN: Prealbumin: 15.6 mg/dL — ABNORMAL LOW (ref 18–38)

## 2014-07-07 LAB — MAGNESIUM: MAGNESIUM: 2.5 mg/dL — AB (ref 1.7–2.4)

## 2014-07-07 MED ORDER — MIDAZOLAM HCL 2 MG/2ML IJ SOLN
INTRAMUSCULAR | Status: AC
  Filled 2014-07-07: qty 2

## 2014-07-07 MED ORDER — BOOST / RESOURCE BREEZE PO LIQD
1.0000 | Freq: Three times a day (TID) | ORAL | Status: DC
  Administered 2014-07-07 – 2014-07-08 (×2): 1 via ORAL

## 2014-07-07 MED ORDER — MIDAZOLAM HCL 2 MG/2ML IJ SOLN
INTRAMUSCULAR | Status: AC | PRN
  Administered 2014-07-07: 1 mg via INTRAVENOUS

## 2014-07-07 MED ORDER — DIPHENHYDRAMINE HCL 25 MG PO CAPS
25.0000 mg | ORAL_CAPSULE | Freq: Every evening | ORAL | Status: DC | PRN
  Administered 2014-07-07: 25 mg via ORAL
  Filled 2014-07-07: qty 1

## 2014-07-07 MED ORDER — FLUMAZENIL 0.5 MG/5ML IV SOLN
INTRAVENOUS | Status: AC
  Filled 2014-07-07: qty 5

## 2014-07-07 MED ORDER — FENTANYL CITRATE (PF) 100 MCG/2ML IJ SOLN
INTRAMUSCULAR | Status: AC
  Filled 2014-07-07: qty 2

## 2014-07-07 MED ORDER — NALOXONE HCL 0.4 MG/ML IJ SOLN
INTRAMUSCULAR | Status: AC
  Filled 2014-07-07: qty 1

## 2014-07-07 MED ORDER — METOCLOPRAMIDE HCL 5 MG/ML IJ SOLN
10.0000 mg | Freq: Once | INTRAMUSCULAR | Status: AC
  Administered 2014-07-07: 10 mg via INTRAVENOUS
  Filled 2014-07-07: qty 2

## 2014-07-07 MED ORDER — CETYLPYRIDINIUM CHLORIDE 0.05 % MT LIQD
7.0000 mL | Freq: Two times a day (BID) | OROMUCOSAL | Status: DC
  Administered 2014-07-07 – 2014-07-18 (×19): 7 mL via OROMUCOSAL

## 2014-07-07 MED ORDER — PIPERACILLIN-TAZOBACTAM 3.375 G IVPB
3.3750 g | Freq: Three times a day (TID) | INTRAVENOUS | Status: DC
  Administered 2014-07-08 – 2014-07-15 (×23): 3.375 g via INTRAVENOUS
  Filled 2014-07-07 (×24): qty 50

## 2014-07-07 MED ORDER — CHLORPROMAZINE HCL 25 MG PO TABS
25.0000 mg | ORAL_TABLET | Freq: Four times a day (QID) | ORAL | Status: DC | PRN
  Filled 2014-07-07: qty 1

## 2014-07-07 NOTE — Progress Notes (Signed)
[facility] PROGRESS NOTE  [name] [name] NAT:[id number] DOB: 1940/08/18 DOA: 07/04/2014 PCP: [name], MD  Assessment/Plan: #1 metastatic disease CT scan and MRI of the L-spine showing multiple abnormalities consistent with metastatic malignancy. CT abdomen and pelvis showing pleural effusion right greater than left. Patient complaining of hip pain. Patient had MRI done yesterday which showed left subdural hematomas. Unable to discern as to whether patient had metastatic disease per report. Patient for biopsy today per IR for diagnosis. Anticoagulation is on hold. Continue current pain regimen. Per oncology.   #2 pleural effusion Noted on CT of the abdomen and pelvis. Patient has been seen by pulmonary and recommend following pleural effusions with daily chest x-rays and if increases in size or worsening shortness of breath that a thoracentesis will need to be performed. Patient has been placed on DuoNeb's to help with mucociliary clearance as well as Mucinex and a flutter valve. Pulmonary following.  #3 left subdural hematomas Case was discussed with Dr. Arnoldo Morale of neurosurgery who reviewed the films, and recommended discontinuing patient's Lovenox which he was on for subclavian thrombus. Patient is neurologically intact. Repeat head CT today pending. See problem #4.  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 Spoke with neurosurgery who recommended discontinuing Lovenox would repeat head CT pending today. Spoke with hematology/oncology who felt patient needed to be anticoagulated however on low-dose heparin with close neuro checks. Patient for biopsy today and a such anticoagulation has been held. Hematology/ oncology following.   #4 dysphagia/shortness of breath Likely secondary to enlarged thyroid. Patient speaking in full sentences. Patient has been seen by ENT who feels there is no evidence of thyroid abscess or cellulitis. ENT  recommending possible needle aspiration of left lobe of thyroid for cytology under ultrasound guidance. Patient to undergo biopsy today per interventional radiology. Barium swallow pending. Currently on full liquid diet.  #5 hyperthyroidism/enlarged thyroid Questionable etiology. Patient here with concern for metastatic disease. Thyroid function studies with a TSH of 0.016. Free T4-5 0.65. Total T3 of 348. Patient has been seen in consultation by endocrinology and labs have been ordered and are pending. Patient has been started on methimazole per endocrinology. Patient also on Decadron. Continue current dose of Lopressor 50 mg twice daily for rate control and titrate as needed. Thyroid ultrasound with enlarged inhomogeneous thyroid gland with a single nonspecific calcification the right lobe. No discrete thyroid mass identified. Scattered cervical lymph nodes normal to mildly enlarged in sizes. Endocrinology following and appreciate input and recommendations.   #6 Carious dentition Per nursing hospital no longer does Panorex films. Will need a dental consultation, may need to do it as outpatient if dentistry no longer comes into the hospital.  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 Check FOBT. Follow H&H.  #8 leukocytosis Likely reactive leukocytosis and steriod induced. Leukocytosis worsened but trending back down and, likely from steriods. Patient has been pancultured and results pending. No need for antibiotics at this time. Follow.  #9 epidural mass/spinal mass Patient has been started on steroids per oncology. Pain management. Per oncology.  #10 hypertension Continue benazepril. Continue lopressor at '50mg'$  BID for rate control. Follow and titrate as needed.  #11 history of rheumatoid arthritis Continue to hold Plaquenil and methotrexate.  #12 prophylaxis PPI for GI prophylaxis. Lovenox for DVT prophylaxis.  Code Status: Full Family Communication: Updated patient, no family at  bedside. Disposition Plan: Remain the step down unit   Consultants:  Oncology: Dr. Marin Olp 23/76/2831  ENT: Dr Erik Obey 51/76/1607  Endocrinology: Dr. Buddy Duty 07/06/2014  Pulmonary: Dr. Lake Bells 07/05/2014  Procedures:  CT abdomen and pelvis 07/05/2014  MRI head 07/06/2014  Thyroid ultrasound 07/06/2014  Antibiotics:  None  HPI/Subjective: Patient states pain medication helping. States some SOB. Still with some dysphagia. Patient c/o weakness. Patient c/o hiccups.  Objective: Filed Vitals:   07/07/14 0821  BP:   Pulse:   Temp: 98.1 F (36.7 C)  Resp:     Intake/Output Summary (Last 24 hours) at 07/07/14 0941 Last data filed at 07/07/14 0900  Gross per 24 hour  Intake 1725.83 ml  Output   1525 ml  Net 200.83 ml   Filed Weights   07/04/14 1422 07/05/14 0400  Weight: 58.5 kg (128 lb 15.5 oz) 61 kg (134 lb 7.7 oz)    Exam:   General:  NAD  Cardiovascular: Tachycardia  Respiratory: Decreased breath sounds in the right base. No crackles. No rhonchi.  Abdomen: Soft, nontender, nondistended, positive bowel sounds.  Musculoskeletal: No clubbing cyanosis or edema.  Data Reviewed: Basic Metabolic Panel:  Recent Labs Lab 07/04/14 1154 07/04/14 1600 07/05/14 0347 07/06/14 0405 07/07/14 0401  NA 135  --  135 137 136  K 4.6  --  4.4 3.8 4.2  CL 105  --  108 110 110  CO2 21*  --  19* 19* 18*  GLUCOSE 99  --  149* 104* 122*  BUN 32*  --  32* 37* 31*  CREATININE 0.51* 0.56* 0.44* 0.47* 0.49*  CALCIUM 9.1  --  8.5* 8.4* 8.1*  MG  --  2.2  --   --  2.5*   Liver Function Tests:  Recent Labs Lab 07/04/14 1154 07/05/14 0347  AST 34 30  ALT 32 34  ALKPHOS 66 68  BILITOT 1.1 1.0  PROT 5.8* 5.8*  ALBUMIN 3.2* 3.0*   No results for input(s): LIPASE, AMYLASE in the last 168 hours. No results for input(s): AMMONIA in the last 168 hours. CBC:  Recent Labs Lab 07/04/14 1154 07/04/14 1600 07/05/14 0347 07/06/14 0405 07/07/14 0401  WBC 20.0* 20.5*  16.3* 26.0* 20.9*  NEUTROABS 15.0*  --   --   --  17.7*  HGB 10.4* 10.2* 10.3* 9.6* 8.8*  HCT 31.4* 30.3* 31.0* 29.3* 26.3*  MCV 94.9 94.7 93.9 95.1 94.6  PLT 162 152 160 182 172   Cardiac Enzymes: No results for input(s): CKTOTAL, CKMB, CKMBINDEX, TROPONINI in the last 168 hours. BNP (last 3 results) No results for input(s): BNP in the last 8760 hours.  ProBNP (last 3 results) No results for input(s): PROBNP in the last 8760 hours.  CBG:  Recent Labs Lab 07/05/14 0841 07/06/14 0755 07/07/14 0814  GLUCAP 143* 102* 106*    Recent Results (from the past 240 hour(s))  Culture, blood (routine x 2)     Status: None (Preliminary result)   Collection Time: 07/04/14  1:54 PM  Result Value Ref Range Status   Specimen Description BLOOD LEFT ANTECUBITAL  Final   Special Requests BOTTLES DRAWN AEROBIC AND ANAEROBIC 5 CC EA  Final   Culture   Final           BLOOD CULTURE RECEIVED NO GROWTH TO DATE CULTURE WILL BE HELD FOR 5 DAYS BEFORE ISSUING A FINAL NEGATIVE REPORT Performed at [facility]    Report Status PENDING  Incomplete  Culture, blood (routine x 2)     Status: None (Preliminary result)   Collection Time:  07/04/14  1:55 PM  Result Value Ref Range Status   Specimen Description BLOOD BLOOD RIGHT FOREARM  Final   Special Requests BOTTLES DRAWN AEROBIC AND ANAEROBIC 5 CC EA  Final   Culture   Final           BLOOD CULTURE RECEIVED NO GROWTH TO DATE CULTURE WILL BE HELD FOR 5 DAYS BEFORE ISSUING A FINAL NEGATIVE REPORT Performed at [facility]    Report Status PENDING  Incomplete  Culture, Urine     Status: None   Collection Time: 07/04/14  1:59 PM  Result Value Ref Range Status   Specimen Description URINE, RANDOM  Final   Special Requests NONE  Final   Colony Count NO GROWTH Performed at [facility]   Final   Culture NO GROWTH Performed at [facility]   Final   Report Status 07/05/2014 FINAL  Final  MRSA PCR Screening     Status:  None   Collection Time: 07/04/14  2:42 PM  Result Value Ref Range Status   MRSA by PCR NEGATIVE NEGATIVE Final    Comment:        The GeneXpert MRSA Assay (FDA approved for NASAL specimens only), is one component of a comprehensive MRSA colonization surveillance program. It is not intended to diagnose MRSA infection nor to guide or monitor treatment for MRSA infections.      Studies: Dg Chest 2 View  07/06/2014   CLINICAL DATA:  Followup bilateral pleural effusions.  EXAM: CHEST  2 VIEW  COMPARISON:  12/11/2013 and chest CT dated 07/03/2014.  FINDINGS: Borderline enlarged cardiac silhouette. Small to moderate-sized bilateral pleural effusions with previously demonstrated associated atelectasis. 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 Left shoulder degenerative changes.  IMPRESSION: 1. Stable bilateral pleural effusions and associated atelectasis. 2. Stable changes of COPD and borderline cardiomegaly.   Electronically Signed   By: Claudie Revering M.D.   On: 07/06/2014 09:24   Mr Jeri Cos TI Contrast  07/06/2014   CLINICAL DATA:  74 year old male with metastatic disease detected on recent neck, chest CT and lumbar MRI. 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 Biopsy pending. Staging. Subsequent encounter.  EXAM: MRI HEAD WITHOUT AND WITH CONTRAST  TECHNIQUE: Multiplanar, multiecho pulse sequences of the brain and surrounding structures were obtained without and with intravenous contrast.  CONTRAST:  48m MULTIHANCE GADOBENATE DIMEGLUMINE 529 MG/ML IV SOLN  COMPARISON:  Neck CT 07/03/2014.  FINDINGS: Overlying the left superior frontal gyrus there is a 30 x 37 x 8 mm area of abnormal signal and enhancement involving the dura; pachy- and leptomeninges, and also the calvarium. 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 See series 7, image 23, post-contrast series 11, image 52 series 13, image 14 and series 12, image 12. Still, there is no associated cortical or cerebral edema, in the adjacent bone marrow signal appears normal, without an infiltrative osseous lesion in this area. 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  There is a superimposed left posterior fossa subdural hematoma measuring up to 11 mm in thickness which appears to be new since 07/03/2014. There is a hematocrit level associated. See series 6, image 7 and series 7, image 7. The hematoma wraps around the left cerebellar hemisphere, but there is no posterior fossa midline shift or basilar cistern  effacement. There is also a left supratentorial subdural hematoma measuring up to 5-6 mm in thickness series 7, image 15 and series 10, image 11). Associated susceptibility artifact including on trace diffusion imaging.  Trace supratentorial rightward midline shift. No ventricular effacement. No other significant intracranial mass effect.  There is smooth thickening and hyperenhancement of the left tentorium (series 12, image 10) without associated leptomeningeal enhancement, likely reactive due to the subdural blood.  No other abnormal intracranial enhancement identified. No parenchymal tumor or metastasis identified.  Grossly negative visualized cervical spine and cervical spinal cord.  No restricted diffusion or evidence of acute infarction. No intraventricular hemorrhage or ventriculomegaly. Major intracranial vascular flow voids are preserved, including the superior sagittal sinus which is marginally affected by the left superior frontal lesion described in the first paragrah. Normal for age gray and white matter signal. Negative pituitary and cervicomedullary junction.  Visible internal auditory structures appear normal. Mastoids are clear. Trace paranasal sinus mucosal thickening.  Visualized orbit soft tissues are within normal limits. Visualized scalp soft tissues are within normal limits. Bone marrow signal aside from that described at the left vertex is within normal limits.  IMPRESSION: 1. Left side subdural hematomas in both the posterior fossa and along the left hemisphere, appear to be new since the neck CT on 07/03/2014. 2. Abnormal appearance of a 3-4 cm area of the pachy- and leptomeninges at the left vertex over the left superior frontal gyrus. 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 In the absence of a known malignancy, the differential diagnosis of this dural abnormality would include a dural vascular malformation. 3. No cerebral or cerebellar edema. No significant intracranial mass effect at this time. 4. No other putative metastatic disease identified about the brain; smooth left tentorial dural thickening is likely reactive due to the hemorrhage. Study discussed by telephone with Dr. Cline Cools on 07/06/2014 at 1443 hours.   Electronically Signed   By: Genevie Ann M.D.   On: 07/06/2014 14:55   US Soft Tissue Head/neck  07/06/2014   CLINICAL DATA:  Hyperthyroidism  EXAM: THYROID ULTRASOUND  TECHNIQUE: Ultrasound examination of the thyroid gland and adjacent soft tissues was performed.  COMPARISON:  None ; correlation CT soft tissue neck 07/03/2014  FINDINGS: Right thyroid lobe  Measurements: 6.2 x 4.3 x 2.5 cm. Tiny calcification 2 mm diameter within RIGHT lobe. Diffusely inhomogeneous echogenicity. No discrete mass.  Left thyroid lobe  Measurements: 5.4 x 2.6 x 3.2 cm. Heterogeneous echogenicity without discrete mass.  Isthmus  Thickness: 15 mm thick, thickened. Inhomogeneous echogenicity without focal mass.  Lymphadenopathy  Numerous lymph nodes identified in the cervical regions bilaterally up  to 10 mm short axis diameters.  IMPRESSION: Enlarged inhomogeneous thyroid gland with a single nonspecific calcification in the RIGHT lobe.  No discrete thyroid mass identified.  Scattered cervical lymph nodes normal to mildly enlarged in sizes.  Patient received IV contrast material on 07/03/2014 and again on 07/05/2014; the iodine load within these contrast administrations will prevent performance of an accurate radionuclide thyroid uptake and scan as well as potential radioactive iodine therapy for hyperthyroidism for 6-8 weeks.   Electronically Signed   By: Lavonia Dana M.D.   On: 07/06/2014 13:30    Scheduled Meds: . acetaminophen  500 mg Oral TID  . antiseptic oral rinse  7 mL Mouth Rinse  BID  . benazepril  40 mg Oral Daily  . budesonide-formoterol  2 puff Inhalation BID  . dexamethasone  20 mg Intravenous Q24H  . docusate sodium  100 mg Oral BID  . feeding supplement (RESOURCE BREEZE)  1 Container Oral TID BM  . guaiFENesin  600 mg Oral BID  . methimazole  60 mg Oral Daily  . metoprolol tartrate  50 mg Oral BID  . OxyCODONE  15 mg Oral Q12H  . sodium chloride  3 mL Intravenous Q12H  . tiotropium  18 mcg Inhalation Daily   Continuous Infusions: . sodium chloride 50 mL/hr at 07/07/14 2103    Principal Problem:   Metastatic cancer Active Problems:   Rheumatoid arthritis   Hypertension   Lung nodule   Pain of right lower extremity-Lower Back and  Legs   Weakness-Bilateral Leg and Right arm > Left arm   Thrombosis of left subclavian vein   Abnormal thyroid exam   Leukocytosis   Dysphagia   SOB (shortness of breath)   Thyroid enlargement   Poor dentition   Epidural mass   Right paraspinous mass   Rib lesion   Dehydration   Anemia   Metastasis   Pleural effusion   Hyperthyroidism   Neck mass   SDH (subdural hematoma)    Time spent: 9 minutes    Alize Acy M.D. [facility] Pager [phone]. If 7PM-7AM, please contact night-coverage at [email or web address],  password [facility] 07/07/2014, 9:41 AM  LOS: 3 days

## 2014-07-07 NOTE — Procedures (Signed)
Interventional Radiology Procedure Note  Procedure: US guided bx of left neck lymph nodes.  5 x 18 g core.  Complications: No immediate Recommendations:  - Ok to shower tomorrow   - Routine care   Signed,  Dulcy Fanny. Earleen Newport, DO

## 2014-07-07 NOTE — Progress Notes (Signed)
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 His thyroid does not look as prominent. I started him on some steroids. This would make you think that he may have lymphoma.  His tumor markers however, show a markedly elevated CA 19-9.  He still is having problems with the right leg. There is pain in the right leg. This might be from his metastatic disease to the lumbar spine. I spoke with radiation oncology on Friday. They will see him today.  He is very worried about what kind of care he will get once he is discharged. He says he cannot go home. I agree with this. I think he probably will need rehabilitation or skilled nursing depending on the, malignancy that he has.  He is scheduled for a biopsy today. I would think a thyroid biopsy would be the way to go.  I will get interventional radiology involved and see if they can do anything for his lumbar spine with respect to kyphoplasty or vertebroplasty.  He is somewhat anemic. I would make sure that his stools were checked for blood. Given that he has elevated CA 19-9, this might indicate a GI malignancy and as such, I would consider upper endoscopy on him. His CEA is normal.  We will have to see what his prealbumin is.  He definitely needs a nutrition consult.  His MRI of the brain is hard to interpret. I have no idea what the radiologists are saying as to what is going on. I do not know if they are implying that he has metastases to the brain.  An LDH might be helpful.  On his physical exam, the really is no change from what I saw on Friday. Again his thyroid does not appear as prominent. He is able to move his legs. He has decent strength and range of motion of the left leg. He has some decreased range of motion of the right upper leg.  For now, we have to await the biopsy results.  [name] E.  [name] 7:14

## 2014-07-07 NOTE — Progress Notes (Signed)
Initial Nutrition Assessment  DOCUMENTATION CODES:  Not applicable  INTERVENTION:  Boost Breeze (TID) RD to follow-up to complete assessment when patient is more available  NUTRITION DIAGNOSIS:  No diagnosis at this time.  GOAL:  Patient will meet greater than or equal to 90% of their needs  MONITOR:  Diet advancement, Labs, Weight trends, Skin, I & O's  REASON FOR ASSESSMENT:  Consult Assessment of nutrition requirement/status  ASSESSMENT: 74 y.o. patient with metastatic cancer with complaints of a 3 month history of chest pain, bilateral rib pain left greater than right, weakness, neck swelling which has worsened over the past 3 weeks to the point where he sometimes has difficulty breathing and problems swallowing. Patient also with a six-month period of a 10 pound weight loss. Patient does endorse decreased oral intake and decreased appetite over the past month. Patient states recently he's been able to eat is a piece of bread and a cup of milk daily.  Pt not in room during RD visit. Pt undergoing neck lymph node biopsy. Currently NPO for procedure. Pt was consuming 50% of full liquids.   RD to follow-up at a later time to complete full assessment.  Labs reviewed:  Elevated BUN, Mg Low Creatinine  Height:  Ht Readings from Last 1 Encounters:  07/04/14 '5\' 7"'$  (1.702 m)    Weight:  Wt Readings from Last 1 Encounters:  07/05/14 134 lb 7.7 oz (61 kg)    Ideal Body Weight:  67.3 kg  Wt Readings from Last 10 Encounters:  07/05/14 134 lb 7.7 oz (61 kg)  01/14/14 147 lb 3.2 oz (66.769 kg)  12/11/13 147 lb 4.3 oz (66.8 kg)  12/05/13 148 lb 1.6 oz (67.178 kg)  11/29/13 149 lb (67.586 kg)  11/19/13 146 lb 11.2 oz (66.543 kg)  11/12/13 145 lb (65.772 kg)  08/25/11 140 lb (63.504 kg)  02/09/10 143 lb (64.864 kg)  02/19/09 148 lb (67.132 kg)    BMI:  Body mass index is 21.06 kg/(m^2).  Estimated Nutritional Needs:  Kcal:     Protein:     Fluid:       Skin:  Reviewed, no issues  Diet Order:  Diet NPO time specified Except for: Sips with Meds  EDUCATION NEEDS:  No education needs identified at this time   Intake/Output Summary (Last 24 hours) at 07/07/14 1347 Last data filed at 07/07/14 1200  Gross per 24 hour  Intake   1550 ml  Output   1375 ml  Net    175 ml    Last BM:  5/5  Clayton Bibles, MS, RD, LDN Pager: [phone] After Hours Pager: [phone]

## 2014-07-07 NOTE — Progress Notes (Signed)
ANTIBIOTIC CONSULT NOTE - INITIAL  Pharmacy Consult for Zosyn Indication: Aspiration PNA  Allergies  Allergen Reactions  . Other     Catfish + IV pain medication (unknown name)-vomits    Patient Measurements: Height: '5\' 7"'$  (170.2 cm) Weight: 134 lb 7.7 oz (61 kg) IBW/kg (Calculated) : 66.1   Vital Signs: Temp: 98 F (36.7 C) (05/09 1706) BP: 180/75 mmHg (05/09 2113) Pulse Rate: 97 (05/09 2113) Intake/Output from previous day: 05/08 0701 - 05/09 0700 In: 1800.8 [I.V.:1800.8] Out: 1525 [Urine:1525] Intake/Output from this shift:    Labs:  Recent Labs  07/05/14 0347 07/06/14 0405 07/07/14 0401  WBC 16.3* 26.0* 20.9*  HGB 10.3* 9.6* 8.8*  PLT 160 182 172  CREATININE 0.44* 0.47* 0.49*   Estimated Creatinine Clearance: 73.1 mL/min (by C-G formula based on Cr of 0.49). No results for input(s): VANCOTROUGH, VANCOPEAK, VANCORANDOM, GENTTROUGH, GENTPEAK, GENTRANDOM, TOBRATROUGH, TOBRAPEAK, TOBRARND, AMIKACINPEAK, AMIKACINTROU, AMIKACIN in the last 72 hours.   Microbiology: Recent Results (from the past 720 hour(s))  Culture, blood (routine x 2)     Status: None (Preliminary result)   Collection Time: 07/04/14  1:54 PM  Result Value Ref Range Status   Specimen Description BLOOD LEFT ANTECUBITAL  Final   Special Requests BOTTLES DRAWN AEROBIC AND ANAEROBIC 5 CC EA  Final   Culture   Final           BLOOD CULTURE RECEIVED NO GROWTH TO DATE CULTURE WILL BE HELD FOR 5 DAYS BEFORE ISSUING A FINAL NEGATIVE REPORT Performed at [facility]    Report Status PENDING  Incomplete  Culture, blood (routine x 2)     Status: None (Preliminary result)   Collection Time: 07/04/14  1:55 PM  Result Value Ref Range Status   Specimen Description BLOOD BLOOD RIGHT FOREARM  Final   Special Requests BOTTLES DRAWN AEROBIC AND ANAEROBIC 5 CC EA  Final   Culture   Final           BLOOD CULTURE RECEIVED NO GROWTH TO DATE CULTURE WILL BE HELD FOR 5 DAYS BEFORE ISSUING A FINAL NEGATIVE  REPORT Performed at [facility]    Report Status PENDING  Incomplete  Culture, Urine     Status: None   Collection Time: 07/04/14  1:59 PM  Result Value Ref Range Status   Specimen Description URINE, RANDOM  Final   Special Requests NONE  Final   Colony Count NO GROWTH Performed at [facility]   Final   Culture NO GROWTH Performed at [facility]   Final   Report Status 07/05/2014 FINAL  Final  MRSA PCR Screening     Status: None   Collection Time: 07/04/14  2:42 PM  Result Value Ref Range Status   MRSA by PCR NEGATIVE NEGATIVE Final    Comment:        The GeneXpert MRSA Assay (FDA approved for NASAL specimens only), is one component of a comprehensive MRSA colonization surveillance program. It is not intended to diagnose MRSA infection nor to guide or monitor treatment for MRSA infections.     Medical History: Past Medical History  Diagnosis Date  . Lung nodule 2012  . Insomnia     takes Restoril nightly as needed  . Hypertension     takes Benazepril and Metoprolol daily  . Pneumonia     hx of;many yrs ago  . AAA (abdominal aortic aneurysm)   . Joint pain   . Joint swelling   . Urinary  frequency     takes Terazosin daily  . Urinary urgency   . Nocturia   . Enlarged prostate     slightly  . Rheumatoid arthritis(714.0)     takes Plaquenil daily and Methotrexate weekly    Medications:  Prescriptions prior to admission  Medication Sig Dispense Refill Last Dose  . benazepril (LOTENSIN) 10 MG tablet Take 10 mg by mouth daily.   07/04/2014 at Unknown time  . Calcium 600-200 MG-UNIT per tablet Take 1 tablet by mouth daily.    Past Week at Unknown time  . Cholecalciferol (D 2000) 2000 UNITS TABS Take 1 tablet by mouth daily.    Past Week at Unknown time  . fish oil-omega-3 fatty acids 1000 MG capsule Take 1 g by mouth daily.   Past Week at Unknown time  . folic acid (FOLVITE) 1 MG tablet Take 1 mg by mouth daily.   07/03/2014 at Unknown  time  . hydroxychloroquine (PLAQUENIL) 200 MG tablet Take 200 mg by mouth 2 (two) times daily.   07/04/2014 at Unknown time  . ibuprofen (ADVIL,MOTRIN) 200 MG tablet Take 200 mg by mouth every 4 (four) hours as needed for moderate pain.   07/03/2014 at Unknown time  . methotrexate 2.5 MG tablet Take 17.5 mg by mouth every 7 (seven) days. Pt states he either takes it on Friday or Saturday.   06/27/2014 at Unknown time  . metoprolol succinate (TOPROL-XL) 25 MG 24 hr tablet Take 25 mg by mouth daily.   07/03/2014 at 2000  . Misc Natural Products (OSTEO BI-FLEX JOINT SHIELD) TABS Take 1 tablet by mouth daily.    Past Week at Unknown time  . Multiple Vitamin (MULTIVITAMIN) tablet Take 1 tablet by mouth daily.   07/03/2014 at Unknown time  . vitamin E (VITAMIN E) 400 UNIT capsule Take 400 Units by mouth 2 (two) times daily.    Past Week at Unknown time  . zolpidem (AMBIEN) 10 MG tablet Take 10 mg by mouth at bedtime as needed for sleep.   Past Week at Unknown time  . doxycycline (VIBRA-TABS) 100 MG tablet    Completed Course at Unknown time  . temazepam (RESTORIL) 15 MG capsule Take 1 capsule (15 mg total) by mouth at bedtime as needed for sleep. 30 capsule 5   . traMADol (ULTRAM) 50 MG tablet Take 1 tablet (50 mg total) by mouth every 6 (six) hours as needed for moderate pain. (Patient not taking: Reported on 07/04/2014) 20 tablet 0 Not Taking at Unknown time   Scheduled:  . acetaminophen  500 mg Oral TID  . antiseptic oral rinse  7 mL Mouth Rinse BID  . benazepril  40 mg Oral Daily  . budesonide-formoterol  2 puff Inhalation BID  . dexamethasone  20 mg Intravenous Q24H  . docusate sodium  100 mg Oral BID  . feeding supplement (RESOURCE BREEZE)  1 Container Oral TID BM  . guaiFENesin  600 mg Oral BID  . methimazole  60 mg Oral Daily  . metoprolol tartrate  50 mg Oral BID  . OxyCODONE  15 mg Oral Q12H  . piperacillin-tazobactam (ZOSYN)  IV  3.375 g Intravenous Q8H  . sodium chloride  3 mL Intravenous Q12H   . tiotropium  18 mcg Inhalation Daily   Infusions:  . sodium chloride 50 mL/hr at 07/07/14 1110   Assessment: 70 yoM with aspiration, hypoxia Chest xray shows PNA.  Zosyn per RX for aspiration PNA.   Goal of Therapy:  Treat  PNA  Plan:   Zosyn 3.375 gm IV q8h EI  F/u SCr/cultures as needed  Dorrene German 07/07/2014,11:47 PM

## 2014-07-07 NOTE — Consult Note (Signed)
Reason for consult: Neck lymph node biopsy  Referring Physician(s): TRH/Ennever  History of Present Illness: [name] [name] is a 74 y.o. male, former smoker, with history of hypertension, AAA with stent graft in 2015, rheumatoid arthritis who was recently admitted with right and left lateral chest discomfort, weakness, neck swelling, dysphagia, dyspnea, hip pain with radiation to the right lower extremity, hemoptysis, weight loss and decreased appetite. 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 Request has now been received for ultrasound-guided neck lymph node biopsy for further evaluation.   Past Medical History  Diagnosis Date  . Lung nodule 2012  . Insomnia     takes Restoril nightly as needed  . Hypertension     takes Benazepril and Metoprolol daily  . Pneumonia     hx of;many yrs ago  . AAA (abdominal aortic aneurysm)   . Joint pain   . Joint swelling   . Urinary frequency     takes Terazosin daily  . Urinary urgency   . Nocturia   . Enlarged prostate     slightly  . Rheumatoid arthritis(714.0)     takes Plaquenil daily and Methotrexate weekly    Past Surgical History  Procedure Laterality Date  .  Inguinal hernia repair  2008  . Appendectomy    . Abdominal aortic endovascular stent graft Bilateral 12/11/2013    Procedure: ABDOMINAL AORTIC ENDOVASCULAR STENT GRAFT; left groin exploration;  Surgeon: Rosetta Posner, MD;  Location: [facility] OR;  Service: Vascular;  Laterality: Bilateral;    Allergies: Other  Medications: Prior to Admission medications   Medication Sig Start Date End Date Taking? Authorizing Provider  benazepril (LOTENSIN) 10 MG tablet Take 10 mg by mouth daily.   Yes Historical Provider, MD  Calcium 600-200 MG-UNIT per tablet Take 1 tablet by mouth daily.    Yes Historical Provider, MD  Cholecalciferol (D 2000) 2000 UNITS TABS Take 1 tablet by mouth daily.    Yes Historical Provider, MD  fish oil-omega-3 fatty acids 1000 MG capsule Take 1 g by mouth daily.   Yes Historical Provider, MD  folic acid (FOLVITE) 1 MG tablet Take 1 mg by mouth daily.   Yes Historical Provider, MD  hydroxychloroquine (PLAQUENIL) 200 MG tablet Take 200 mg by mouth 2 (two) times daily.   Yes Historical Provider, MD  ibuprofen (ADVIL,MOTRIN) 200 MG tablet Take 200 mg by mouth every 4 (four) hours as needed for moderate pain.   Yes Historical Provider, MD  methotrexate 2.5 MG tablet Take 17.5 mg by mouth every 7 (seven) days. Pt states he either takes it on Friday or Saturday.   Yes Historical Provider, MD  metoprolol succinate (TOPROL-XL) 25 MG 24 hr tablet Take 25 mg by mouth daily.   Yes Historical  Provider, MD  Misc Natural Products (OSTEO BI-FLEX JOINT SHIELD) TABS Take 1 tablet by mouth daily.    Yes Historical Provider, MD  Multiple Vitamin (MULTIVITAMIN) tablet Take 1 tablet by mouth daily.   Yes Historical Provider, MD  vitamin E (VITAMIN E) 400 UNIT capsule Take 400 Units by mouth 2 (two) times daily.    Yes Historical Provider, MD  zolpidem (AMBIEN) 10 MG tablet Take 10 mg by mouth at bedtime as needed for sleep.   Yes Historical Provider, MD  doxycycline (VIBRA-TABS) 100 MG tablet  11/27/13    Historical Provider, MD  temazepam (RESTORIL) 15 MG capsule Take 1 capsule (15 mg total) by mouth at bedtime as needed for sleep. 02/28/11 03/30/11  Neena Rhymes, MD  traMADol (ULTRAM) 50 MG tablet Take 1 tablet (50 mg total) by mouth every 6 (six) hours as needed for moderate pain. Patient not taking: Reported on 07/04/2014 12/12/13   Gabriel Earing, PA-C     Family History  Problem Relation Age of Onset  . Hypertension Father   . Stroke Father   . Hypertension Mother   . Diabetes Mother   . Hyperlipidemia Mother     History   Social History  . Marital Status: Married    Spouse Name: N/A  . Number of Children: N/A  . Years of Education: N/A   Social History Main Topics  . Smoking status: Former Smoker    Types: Cigarettes  . Smokeless tobacco: Never Used     Comment: quit smoking at least 78yr ago  . Alcohol Use: No  . Drug Use: No  . Sexual Activity: Not Currently   Other Topics Concern  . None   Social History Narrative     Review of Systems see above  Vital Signs: BP 172/71 mmHg  Pulse 101  Temp(Src) 98.1 F (36.7 C) (Oral)  Resp 16  Ht _0  (1.702 m)  Wt 134 lb 7.7 oz (61 kg)  BMI 21.06 kg/m2  SpO2 94%  Physical Exam patient awake, alert ; bilateral cervical lymphadenopathy and thyromegaly ;chest with diminished breath sounds at the bases, heart tachycardic but regular rhythm ; abdomen soft, positive bowel sounds, nontender, extremities with no significant edema ;  some slight decreased range of motion of right leg  Mallampati Score:     Imaging: Dg Chest 2 View  07/06/2014   CLINICAL DATA:  Followup bilateral pleural effusions.  EXAM: CHEST  2 VIEW  COMPARISON:  12/11/2013 and chest CT dated 07/03/2014.  FINDINGS: Borderline enlarged cardiac silhouette. Small to moderate-sized bilateral pleural effusions with previously demonstrated associated atelectasis. Stable prominence of the interstitial markings with previously demonstrated right upper lobe  cystic interstitial lung disease and adjacent pleural thickening. Left shoulder degenerative changes.  IMPRESSION: 1. Stable bilateral pleural effusions and associated atelectasis. 2. Stable changes of COPD and borderline cardiomegaly.   Electronically Signed   By: SClaudie ReveringM.D.   On: 07/06/2014 09:24   Ct Soft Tissue Neck W Contrast  07/03/2014   CLINICAL DATA:  Neck mass for 2 weeks. Increasing size of neck mass.  EXAM: CT NECK WITH CONTRAST  TECHNIQUE: Multidetector CT imaging of the neck was performed using the standard protocol following the bolus administration of intravenous contrast.  CONTRAST:  75 mL Isovue-300  COMPARISON:  PET-CT 08/17/2011.  Chest CT today.  FINDINGS: Pharynx and larynx: Cystic lesion is present in the body of the RIGHT mandible which may be odontogenic or represent metastatic disease. Oropharynx and  nasopharynx appears patent.  Salivary glands: Parotid glands are within normal limits. Submandibular glands also appear within normal limits.  Thyroid: Massively enlarged thyroid gland with diffuse phlegmon around the gland. There is low attenuation in the central LEFT thyroid lobe that measures about 3.3 x 2.6 cm. This probably represents metastatic disease in a patient with primary malignancy.  Lymph nodes: Innumerable necrotic lymph nodes are present in the LEFT greater than RIGHT level 2 and level 3 nodal stations extending into the chest.  Vascular: Carotid atherosclerosis. Diminutive LEFT internal jugular vein. There is a large thrombus in LEFT subclavian vein that extends to the margin of the LEFT brachiocephalic vein.  Limited intracranial: No gross acute abnormality.  Mastoids and visualized paranasal sinuses: Mastoid air cells are clear. Mucous retention cyst/ polyp in the LEFT maxillary sinus.  Skeleton: Cervical spine degenerative disease. No pathologic fractures.  Upper chest: Deferred to chest CT.  IMPRESSION: 1. Massive thyroid gland enlargement with adjacent phlegmon.  LEFT-greater-than- RIGHT necrotic cervical lymphadenopathy. 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 Acute suppurative thyroiditis is in the differential considerations. 2. 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 3. Markedly carious dentition with osteolysis in the RIGHT mandibular body. This may be odontogenic or metastatic.   Electronically Signed   By: Dereck Ligas M.D.   On: 07/03/2014 16:56   Ct Chest Wo Contrast  07/03/2014   CLINICAL DATA:  F/u lung nodule pain left side with inspirationSob former smokerNo surgNo hx caOld fx'd ribs  EXAM: CT CHEST WITHOUT CONTRAST  TECHNIQUE: Multidetector CT imaging of the chest was performed following the standard protocol without IV contrast.  COMPARISON:  CT, 01/07/2014.  Chest radiograph, 12/11/2013.  FINDINGS: Thoracic inlet: There is heterogeneous soft tissue surrounding the lower trachea consistent with heterogeneous enlargement of the thyroid gland. Margins of the gland are ill-defined. There are multiple prominent neck base lymph nodes the largest 1 cm in short axis. Partly imaged larynx shows some increased soft tissue in mild narrowing of the airway.  Mediastinum and hila: Heart is normal in size. There are mild to moderate coronary artery calcifications. There is dilation of the ascending aorta measuring 4.6 cm x 4.5 cm in diameter. The aorta is mildly dilated along the arch and proximal descending portion. There are diffuse atherosclerotic calcifications throughout the thoracic aorta.  The multiple small mediastinal lymph nodes. There are no pathologically enlarged lymph nodes. No mediastinal masses. No hilar masses or adenopathy.  Lungs and pleura: There are small, right greater than left, bilateral pleural effusions. There are coarse reticular opacities in the right upper lobe with  intervening emphysematous cystic spaces and some intervening hazy ground-glass type opacity. 4 mm noncalcified pleural-based nodule noted in the right upper lobe, image 30, series 4. Calcified granuloma is noted in the right lower lobe peripherally. There is mild atelectasis adjacent to the pleural effusions in both lower lobes there is no pulmonary edema. No pneumothorax. Mild emphysema is noted in the left upper lobe.  Limited upper abdomen: Dense upper abdominal aortic calcification and partly imaged proximal aspect of an aortic stent. Visualized portions of the liver, spleen, gallbladder and pancreas are unremarkable. No adrenal masses.  Musculoskeletal: There are lytic process cysts involving 3 discrete ribs, of the right posterior fourth rib, right posterior and lateral seventh rib and the left anterior and lateral fifth rib. The soft tissue  mass surrounds the lytic process of the left antral lateral fifth rib. No other lytic lesions.  IMPRESSION: 1. There are coarse reticular opacities with intervening hazy ground-glass opacity and associated if edematous cystic change in the right upper lobe. This could all be chronic. Acute infectious or inflammatory infiltrate is possible. 2. No discrete pulmonary mass. 4 mm pleural-based nodule noted in the peripheral right upper lobe. No other suspicious nodules 3. Small, right greater than left, pleural effusions with associated dependent atelectasis. No pulmonary edema. 4. Neck base abnormalities. Patient also underwent neck CT. There is ill-defined soft tissue that involves and surrounds the thyroid gland. The thyroid appears enlarged and heterogeneous. There are multiple prominent neck base lymph nodes. 5. There are lytic lesions involving 3 discrete ribs. The left fifth rib also has associated soft tissue mass. This is highly suspicious for metastatic disease to bone.   Electronically Signed   By: Lajean Manes M.D.   On: 07/03/2014 16:39   Mr Jeri Cos ZO  Contrast  07/06/2014   CLINICAL DATA:  74 year old male with metastatic disease detected on recent neck, chest CT and lumbar MRI. 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 Biopsy pending. Staging. Subsequent encounter.  EXAM: MRI HEAD WITHOUT AND WITH CONTRAST  TECHNIQUE: Multiplanar, multiecho pulse sequences of the brain and surrounding structures were obtained without and with intravenous contrast.  CONTRAST:  31m MULTIHANCE GADOBENATE DIMEGLUMINE 529 MG/ML IV SOLN  COMPARISON:  Neck CT 07/03/2014.  FINDINGS: Overlying the left superior frontal gyrus there is a 30 x 37 x 8 mm area of abnormal signal and enhancement involving the dura; pachy- and leptomeninges, and also the calvarium. 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 See series 7, image 23, post-contrast series 11, image 52 series 13, image 14 and series 12, image 12. Still, there is no associated cortical or cerebral edema, in the adjacent bone marrow signal appears normal, without an infiltrative osseous lesion in this area. 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  There is a superimposed left posterior fossa subdural hematoma measuring up to 11 mm in thickness which appears to be new since 07/03/2014. There is a hematocrit level associated. See series 6, image 7 and series 7, image 7. The hematoma wraps around the left cerebellar hemisphere, but there is no posterior fossa midline shift or basilar cistern effacement. There is also a left supratentorial subdural hematoma measuring up to 5-6 mm in thickness series 7, image 15 and series 10, image 11). Associated susceptibility artifact including on trace diffusion imaging.  Trace supratentorial rightward midline shift. No ventricular effacement. No other significant intracranial mass effect.  There is smooth thickening and hyperenhancement of the left  tentorium (series 12, image 10) without associated leptomeningeal enhancement, likely reactive due to the subdural blood.  No other abnormal intracranial enhancement identified. No parenchymal tumor or metastasis identified.  Grossly negative visualized cervical spine and cervical spinal cord.  No restricted diffusion or evidence of acute infarction. No intraventricular hemorrhage or ventriculomegaly. Major intracranial vascular flow voids are preserved, including the superior sagittal sinus which is marginally affected by the left superior frontal lesion described in the first paragrah. Normal for age gray and white matter signal. Negative pituitary and cervicomedullary junction.  Visible internal auditory structures appear normal. Mastoids are clear. Trace paranasal sinus mucosal thickening. Visualized orbit soft tissues are within normal limits. Visualized scalp soft tissues are within normal  limits. Bone marrow signal aside from that described at the left vertex is within normal limits.  IMPRESSION: 1. Left side subdural hematomas in both the posterior fossa and along the left hemisphere, appear to be new since the neck CT on 07/03/2014. 2. Abnormal appearance of a 3-4 cm area of the pachy- and leptomeninges at the left vertex over the left superior frontal gyrus. 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 In the absence of a known malignancy, the differential diagnosis of this dural abnormality would include a dural vascular malformation. 3. No cerebral or cerebellar edema. No significant intracranial mass effect at this time. 4. No other putative metastatic disease identified about the brain; smooth left tentorial dural thickening is likely reactive due to the hemorrhage. Study discussed by telephone with Dr. Cline Cools on  07/06/2014 at 1443 hours.   Electronically Signed   By: Genevie Ann M.D.   On: 07/06/2014 14:55   Mr Lumbar Spine Wo Contrast  07/04/2014   CLINICAL DATA:  Severe low back pain radiating into the right hip and leg. Symptoms for 1 year.  EXAM: MRI LUMBAR SPINE WITHOUT CONTRAST  TECHNIQUE: Multiplanar, multisequence MR imaging of the lumbar spine was performed. No intravenous contrast was administered.  COMPARISON:  CT abdomen and pelvis 01/07/2014. PET CT scan 08/17/2011.  FINDINGS: Multiple foci of abnormal marrow signal are identified post consistent with metastatic disease or less likely multiple myeloma. L4 is completely replaced by tumor. A large lesion is also identified and L5 eccentric to the right extending into the right pedicle. Multiple additional lesions are seen including the posterior right and left ilium and sacrum. Paraspinous soft tissue structures demonstrate abnormal signal to the right of the L3 vertebral body extending into the psoas musculature and posterior paraspinous musculature inferiorly to the level of L4-5. Epidural tumor is seen from L3 to L5 with areas of extension out of both the vertebral body and posterior elements. The conus medullaris is normal in signal and position. Imaged intra-abdominal contents show partial visualization of abdominal aortic aneurysm which is been repaired.  The T11-12 level is imaged in the sagittal plane only and negative.  T12-L1: Small central protrusion is identified without central canal or foraminal narrowing.  L1-2:  Negative.  L2-3: Shallow disc bulge to the left without central canal or foraminal narrowing.  L3-4: Epidural tumor on the right extends into the right foramen and right paravertebral space and encroaches on the right L3 root and descending right L4 root. The left foramen appears open. There is also disc bulge this level.  L4-5: Epidural tumor extends out of the vertebral body narrowing the central canal with bulky tumor extension into the  right paravertebral space and foramen encroaching on the exiting right L4 root.  L5-S1: Large epidural tumor deposits are seen extending out of the right lamina of L5 causing marked narrowing of the central canal and right lateral recess with encroachment on the descending and exiting right L5 root. There is a shallow disc bulge at this level.  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 Most notably affected levels are from L3 to L5-S1 level.  Findings were discussed with Dr. Carmin Muskrat at the  time of interpretation.   Electronically Signed   By: Inge Rise M.D.   On: 07/04/2014 10:02   US Soft Tissue Head/neck  07/06/2014   CLINICAL DATA:  Hyperthyroidism  EXAM: THYROID ULTRASOUND  TECHNIQUE: Ultrasound examination of the thyroid gland and adjacent soft tissues was performed.  COMPARISON:  None ; correlation CT soft tissue neck 07/03/2014  FINDINGS: Right thyroid lobe  Measurements: 6.2 x 4.3 x 2.5 cm. Tiny calcification 2 mm diameter within RIGHT lobe. Diffusely inhomogeneous echogenicity. No discrete mass.  Left thyroid lobe  Measurements: 5.4 x 2.6 x 3.2 cm. Heterogeneous echogenicity without discrete mass.  Isthmus  Thickness: 15 mm thick, thickened. Inhomogeneous echogenicity without focal mass.  Lymphadenopathy  Numerous lymph nodes identified in the cervical regions bilaterally up to 10 mm short axis diameters.  IMPRESSION: Enlarged inhomogeneous thyroid gland with a single nonspecific calcification in the RIGHT lobe.  No discrete thyroid mass identified.  Scattered cervical lymph nodes normal to mildly enlarged in sizes.  Patient received IV contrast material on 07/03/2014 and again on 07/05/2014; the iodine load within these contrast administrations will prevent performance of an accurate  radionuclide thyroid uptake and scan as well as potential radioactive iodine therapy for hyperthyroidism for 6-8 weeks.   Electronically Signed   By: Lavonia Dana M.D.   On: 07/06/2014 13:30   Ct Abdomen Pelvis W Contrast  07/05/2014   CLINICAL DATA:  Continued staging of newly diagnosed bone lesions. WBC=16.3*  EXAM: CT ABDOMEN AND PELVIS WITH CONTRAST  TECHNIQUE: Multidetector CT imaging of the abdomen and pelvis was performed using the standard protocol following bolus administration of intravenous contrast.  CONTRAST:  32m OMNIPAQUE IOHEXOL 300 MG/ML SOLN, 1010mOMNIPAQUE IOHEXOL 300 MG/ML SOLN  COMPARISON:  01/07/2014  FINDINGS: Moderate right small left pleural effusions. There is significant left right lower lobe atelectasis. No pulmonary edema. Heart is normal in size. No lung base mass or discrete nodule.  Liver, spleen, gallbladder, pancreas, adrenal glands:  Unremarkable.  Small low-density renal lesions, most likely cysts. Relative area of decreased enhancement in the inferior medial aspect the left kidney, which is stable from the prior CT. Small cysts have increased in size since the prior study. No other renal abnormality. No hydronephrosis. Normal ureters. Bladder is unremarkable.  Excluded infrarenal abdominal aortic aneurysm. Native aneurysm measures 6.2 cm x 4.9 cm transversely. 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 These findings are relatively stable.  No pathologically enlarged lymph nodes.  No ascites.  Colon and small bowel are unremarkable. Appendix not convincingly seen.  Degenerative changes noted of the lumbar spine. There is a mottled appearance of vertebrae, but no discrete osteoblastic or osteolytic lesion. The widespread lesions evident on MRI are not well resolved high CT.  IMPRESSION: 1. No acute finding within the abdomen pelvis. 2. No extraosseous metastatic disease or evidence of a primary neoplasm. 3. Skeletal metastatic disease noted on the  recent prior MRI is not well-defined on CT. The visible vertebrae show a subtle heterogeneous pattern without a discrete osteoblastic or osteolytic lesion. 4. Moderate right and small to moderate left pleural effusions with significant associated lower lobe atelectasis. No lung base nodules.   Electronically Signed   By: DaLajean Manes.D.   On: 07/05/2014 09:39    Labs:  CBC:  Recent Labs  07/04/14 1600 07/05/14 0347 07/06/14 0405 07/07/14 0401  WBC 20.5* 16.3* 26.0* 20.9*  HGB 10.2* 10.3* 9.6* 8.8*  HCT 30.3* 31.0* 29.3* 26.3*  PLT 152 160 182 172    COAGS:  Recent Labs  12/05/13 1012 12/11/13 1140 07/04/14 1154 07/05/14 0347 07/07/14 0401  INR 1.00 1.12 1.39 1.50* 1.49  APTT 31 33  --   --   --     BMP:  Recent Labs  07/04/14 1154 07/04/14 1600 07/05/14 0347 07/06/14 0405 07/07/14 0401  NA 135  --  135 137 136  K 4.6  --  4.4 3.8 4.2  CL 105  --  108 110 110  CO2 21*  --  19* 19* 18*  GLUCOSE 99  --  149* 104* 122*  BUN 32*  --  32* 37* 31*  CALCIUM 9.1  --  8.5* 8.4* 8.1*  CREATININE 0.51* 0.56* 0.44* 0.47* 0.49*  GFRNONAA >60 >60 >60 >60 >60  GFRAA >60 >60 >60 >60 >60    LIVER FUNCTION TESTS:  Recent Labs  12/05/13 1012 07/04/14 1154 07/05/14 0347  BILITOT 0.3 1.1 1.0  AST 24 34 30  ALT 18 32 34  ALKPHOS 64 66 68  PROT 7.2 5.8* 5.8*  ALBUMIN 4.0 3.2* 3.0*    TUMOR MARKERS:  Recent Labs  07/04/14 2015  CEA 1.1  CA199 546*    Assessment and Plan: MESSI TWEDT is a 74 y.o. male, former smoker, with history of hypertension, AAA with stent graft in 2015, rheumatoid arthritis who was recently admitted with right and left lateral chest discomfort, weakness, neck swelling, dysphagia, dyspnea, hip pain with radiation to the right lower extremity, hemoptysis, weight loss and decreased appetite. 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 Request has now been received for ultrasound-guided neck lymph node biopsy for further evaluation. Imaging studies have been reviewed . Details/risks of biopsy, including but not limited to internal bleeding, infection, injury to adjacent structures, were discussed with patient with his understanding and consent. Procedure is scheduled for later today . No discrete fractures were noted in the lower thoracic/ lumbar spine therefore vertebroplasty/kyphoplasty is not indicated at this time.    Signed: Autumn Messing 07/07/2014, 10:00 AM   I spent a total of 20 minutes  in face to face in clinical consultation, greater than 50% of which was counseling/coordinating care for ultrasound guided neck lymph node biopsy

## 2014-07-07 NOTE — Progress Notes (Signed)
CSW received referral for New [facility].  CSW attempted to meet with pt at bedside, but pt currently in test/procedure.   CSW to follow up at a later time to complete full psychosocial assessment.   CSW to continue to follow.   Alison Murray, MSW, [facility] [phone]

## 2014-07-07 NOTE — Progress Notes (Signed)
Date:  Jul 07, 2014 U.R. performed for needs and level of care. Will continue to follow for Case Management needs.  Rhonda Davis, RN, BSN, CCM   [phone] 

## 2014-07-07 NOTE — Consult Note (Signed)
Radiation Oncology         [phone] ________________________________  Initial inpatient Consultation  Name: [name] [name] MRN: [id number]  Date: 07/04/2014  DOB: 1940-07-23  OJ:JKKX,FGHWEXH, MD  No ref. 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 male who presented with dysphagia, weight loss, weakness,  Rib pain, mid/low back pain, right hip and leg pain, and a rapidly growing neck mass.  He is a former smoker with lung nodules that have been followed with imaging for a couple years.    He saw his PCP particularly for the weakness, pain, and neck mass. CT of neck showed a large thyroid mass and cervical adenopathy.  Biopsy pending for today of the neck.  CT CAP do not show obvious visceral metastatic disease; he does have pleural effusions and rib lesions c/w metastases.  He underwent Lumbar spine MRI with extensive metastatic disease involving epidural space, paraspinal tissues, bone; also MRI of Brain shows left subdural hematomas and possibly left sided dural / calvarial metastases but this is less clear.  He reports that recently, a spine doctor gave him injections which helped the back pain, but not the right hip or right leg pain.  He can swallow liquids well, but has more trouble with solids.  His stomach is "upset" with solids and he sometimes vomits solids. He has lost 15 lb.  Rib pain is in the left and the right ant/lat rib cage. Denies HAs or seizures.  His right leg/hip always hurt, and he has weakness in the right leg, possibly due to pain.  Numbness is noted in the right foot (waxes/wanes).  Last BM was on Thurs, which he attributes to eating less. Denies urinary complaints.  Sometimes has difficulty breathing, but does not appear SOB today.  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   PREVIOUS RADIATION THERAPY:  No  PAST MEDICAL HISTORY:  has a past medical history of Lung nodule (2012); Insomnia; Hypertension; Pneumonia; AAA (abdominal aortic aneurysm); Joint pain; Joint swelling; Urinary frequency; Urinary urgency; Nocturia; Enlarged prostate; and Rheumatoid arthritis(714.0).    PAST SURGICAL HISTORY: Past Surgical History  Procedure Laterality Date  . Inguinal hernia repair  2008  . Appendectomy    . Abdominal aortic endovascular stent graft Bilateral 12/11/2013    Procedure: ABDOMINAL AORTIC ENDOVASCULAR STENT GRAFT; left groin exploration;  Surgeon: Rosetta Posner, MD;  Location: [facility] OR;  Service: Vascular;  Laterality: Bilateral;    FAMILY HISTORY: family history includes Diabetes in his mother; Hyperlipidemia in his mother; Hypertension in his father and mother; Stroke in his father.  SOCIAL HISTORY:  reports that he has quit smoking. His smoking use included Cigarettes. He has never used smokeless tobacco. He reports that he does not drink alcohol or use illicit drugs.  ALLERGIES: Other  MEDICATIONS:  Current Facility-Administered Medications  Medication Dose Route Frequency Provider Last Rate Last Dose  . 0.9 %  sodium chloride infusion   Intravenous Continuous Eugenie Filler, MD 75 mL/hr at 07/06/14 2340    . acetaminophen (TYLENOL) tablet 650 mg  650 mg Oral Q6H PRN Eugenie Filler, MD       Or  . acetaminophen (TYLENOL) suppository 650 mg  650 mg Rectal Q6H PRN Eugenie Filler, MD      . acetaminophen (TYLENOL) tablet 500 mg  500 mg Oral TID  Eugenie Filler, MD   500 mg at 07/06/14 2155  . alum & mag hydroxide-simeth (MAALOX/MYLANTA) 200-200-20 MG/5ML suspension 30 mL  30 mL Oral Q6H PRN Eugenie Filler, MD   30 mL at 07/05/14 2341  . antiseptic oral rinse (CPC / CETYLPYRIDINIUM CHLORIDE 0.05%) solution 7 mL  7 mL Mouth Rinse BID Eugenie Filler, MD      . benazepril (LOTENSIN) tablet 40 mg  40 mg Oral Daily Eugenie Filler, MD      . budesonide-formoterol [facility])  160-4.5 MCG/ACT inhaler 2 puff  2 puff Inhalation BID Eugenie Filler, MD   2 puff at 07/06/14 2030  . dexamethasone (DECADRON) injection 20 mg  20 mg Intravenous Q24H Volanda Napoleon, MD   20 mg at 07/06/14 1524  . docusate sodium (COLACE) capsule 100 mg  100 mg Oral BID Eugenie Filler, MD   100 mg at 07/06/14 2155  . guaiFENesin (MUCINEX) 12 hr tablet 600 mg  600 mg Oral BID Juanito Doom, MD   600 mg at 07/06/14 2155  . hydrALAZINE (APRESOLINE) injection 10 mg  10 mg Intravenous Q6H PRN Dianne Dun, NP   10 mg at 07/06/14 2338  . ketorolac (TORADOL) 30 MG/ML injection 30 mg  30 mg Intravenous Q6H PRN Eugenie Filler, MD   30 mg at 07/06/14 1757  . levalbuterol (XOPENEX) nebulizer solution 0.63 mg  0.63 mg Nebulization Q3H PRN Juanito Doom, MD      . lidocaine (XYLOCAINE) 2 % viscous mouth solution 15 mL  15 mL Mouth/Throat N1Z PRN Jodi Marble, MD   15 mL at 07/04/14 1237  . methimazole (TAPAZOLE) tablet 60 mg  60 mg Oral Daily Delrae Rend, MD   60 mg at 07/06/14 1500  . metoprolol tartrate (LOPRESSOR) 25 mg/10 mL oral suspension 50 mg  50 mg Oral BID Eugenie Filler, MD   50 mg at 07/06/14 2156  . ondansetron (ZOFRAN) tablet 4 mg  4 mg Oral Q4H PRN Eugenie Filler, MD       Or  . ondansetron [facility]) injection 4 mg  4 mg Intravenous Q4H PRN Irine Seal V, MD      . oxyCODONE (Oxy IR/ROXICODONE) immediate release tablet 5-10 mg  5-10 mg Oral Q4H PRN Eugenie Filler, MD   10 mg at 07/06/14 1502  . OxyCODONE (OXYCONTIN) 12 hr tablet 15 mg  15 mg Oral Q12H Eugenie Filler, MD   15 mg at 07/06/14 2155  . polyethylene glycol (MIRALAX / GLYCOLAX) packet 17 g  17 g Oral Daily PRN Irine Seal V, MD      . sodium chloride 0.9 % injection 3 mL  3 mL Intravenous Q12H Eugenie Filler, MD   3 mL at 07/06/14 2158  . sorbitol 70 % solution 30 mL  30 mL Oral Daily PRN Irine Seal V, MD      . temazepam (RESTORIL) capsule 15-30 mg  15-30 mg Oral QHS PRN Eugenie Filler, MD   30 mg at 07/05/14 2154  . tiotropium (SPIRIVA) inhalation capsule 18 mcg  18 mcg Inhalation Daily Juanito Doom, MD   18 mcg at 07/06/14 1431  . traMADol (ULTRAM) tablet 50 mg  50 mg Oral Q6H PRN Eugenie Filler, MD   50 mg at 07/04/14 2013    REVIEW OF SYSTEMS:  Notable for that above.   PHYSICAL EXAM:  height is 5' 7"  (1.702 m) and weight is  134 lb 7.7 oz (61 kg). His oral temperature is 98 F (36.7 C). His blood pressure is 139/65 and his pulse is 88. His respiration is 11 and oxygen saturation is 94%.   General: Alert and oriented, in no acute distress, no stridor HEENT: Head is normocephalic.  Tongue is grossly clear. Neck: + right low cervical mass and fullness/mass in thyroid region Heart: Regular in rate and rhythm with no murmurs, rubs, or gallops. Chest: Clear to auscultation bilaterally, with no rhonchi, wheezes, or rales. Abdomen: Soft, nontender, nondistended, with no rigidity or guarding. Extremities: No cyanosis or edema. Lymphatics: see Neck Exam Musculoskeletal: 4/5 strength in right leg; otherwise strength intact. Tender to palpation in right hip/pelic bones Neurologic: Cranial nerves II through XII are grossly intact. Decreased sensation - right foot. Speech is fluent without hoareness.  Psychiatric: Judgment and insight are intact. Affect is appropriate.   ECOG = 3  0 - Asymptomatic (Fully active, able to carry on all predisease activities without restriction)  1 - Symptomatic but completely ambulatory (Restricted in physically strenuous activity but ambulatory and able to carry out work of a light or sedentary nature. For example, light housework, office work)  2 - Symptomatic, <50% in bed during the day (Ambulatory and capable of all self care but unable to carry out any work activities. Up and about more than 50% of waking hours)  3 - Symptomatic, >50% in bed, but not bedbound (Capable of only limited self-care, confined to bed or chair 50%  or more of waking hours)  4 - Bedbound (Completely disabled. Cannot carry on any self-care. Totally confined to bed or chair)  5 - Death   Eustace Pen MM, Creech RH, Tormey DC, et al. [phone]). "Toxicity and response criteria of the [facility]". [place] Oncol. 5 (6): 649-55   LABORATORY DATA:  Lab Results  Component Value Date   WBC 20.9* 07/07/2014   HGB 8.8* 07/07/2014   HCT 26.3* 07/07/2014   MCV 94.6 07/07/2014   PLT 172 07/07/2014   CMP     Component Value Date/Time   NA 136 07/07/2014 0401   K 4.2 07/07/2014 0401   CL 110 07/07/2014 0401   CO2 18* 07/07/2014 0401   GLUCOSE 122* 07/07/2014 0401   BUN 31* 07/07/2014 0401   CREATININE 0.49* 07/07/2014 0401   CREATININE 0.76 11/13/2013 1103   CALCIUM 8.1* 07/07/2014 0401   PROT 5.8* 07/05/2014 0347   ALBUMIN 3.0* 07/05/2014 0347   AST 30 07/05/2014 0347   ALT 34 07/05/2014 0347   ALKPHOS 68 07/05/2014 0347   BILITOT 1.0 07/05/2014 0347   GFRNONAA >60 07/07/2014 0401   GFRAA >60 07/07/2014 0401         RADIOGRAPHY: Dg Chest 2 View  07/06/2014   CLINICAL DATA:  Followup bilateral pleural effusions.  EXAM: CHEST  2 VIEW  COMPARISON:  12/11/2013 and chest CT dated 07/03/2014.  FINDINGS: Borderline enlarged cardiac silhouette. Small to moderate-sized bilateral pleural effusions with previously demonstrated associated atelectasis. 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 Left shoulder degenerative changes.  IMPRESSION: 1. Stable bilateral pleural effusions and associated atelectasis. 2. Stable changes of COPD and borderline cardiomegaly.   Electronically Signed   By: Claudie Revering M.D.   On: 07/06/2014 09:24   Ct Soft Tissue Neck W Contrast  07/03/2014   CLINICAL DATA:  Neck mass for 2 weeks. Increasing size of neck mass.  EXAM: CT NECK WITH CONTRAST  TECHNIQUE: Multidetector CT imaging of  the neck was performed using the standard protocol following the bolus administration of intravenous contrast.  CONTRAST:  75 mL Isovue-300  COMPARISON:  PET-CT 08/17/2011.  Chest CT today.  FINDINGS: Pharynx and larynx: Cystic lesion is present in the body of the RIGHT mandible which may be odontogenic or represent metastatic disease. Oropharynx and nasopharynx appears patent.  Salivary glands: Parotid glands are within normal limits. Submandibular glands also appear within normal limits.  Thyroid: Massively enlarged thyroid gland with diffuse phlegmon around the gland. There is low attenuation in the central LEFT thyroid lobe that measures about 3.3 x 2.6 cm. This probably represents metastatic disease in a patient with primary malignancy.  Lymph nodes: Innumerable necrotic lymph nodes are present in the LEFT greater than RIGHT level 2 and level 3 nodal stations extending into the chest.  Vascular: Carotid atherosclerosis. Diminutive LEFT internal jugular vein. There is a large thrombus in LEFT subclavian vein that extends to the margin of the LEFT brachiocephalic vein.  Limited intracranial: No gross acute abnormality.  Mastoids and visualized paranasal sinuses: Mastoid air cells are clear. Mucous retention cyst/ polyp in the LEFT maxillary sinus.  Skeleton: Cervical spine degenerative disease. No pathologic fractures.  Upper chest: Deferred to chest CT.  IMPRESSION: 1. Massive thyroid gland enlargement with adjacent phlegmon. LEFT-greater-than- RIGHT necrotic cervical lymphadenopathy. 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 Acute suppurative thyroiditis is in the differential considerations. 2. 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 3. Markedly carious dentition with osteolysis in the RIGHT mandibular body. This may be odontogenic or  metastatic.   Electronically Signed   By: Dereck Ligas M.D.   On: 07/03/2014 16:56   Ct Chest Wo Contrast  07/03/2014   CLINICAL DATA:  F/u lung nodule pain left side with inspirationSob former smokerNo surgNo hx caOld fx'd ribs  EXAM: CT CHEST WITHOUT CONTRAST  TECHNIQUE: Multidetector CT imaging of the chest was performed following the standard protocol without IV contrast.  COMPARISON:  CT, 01/07/2014.  Chest radiograph, 12/11/2013.  FINDINGS: Thoracic inlet: There is heterogeneous soft tissue surrounding the lower trachea consistent with heterogeneous enlargement of the thyroid gland. Margins of the gland are ill-defined. There are multiple prominent neck base lymph nodes the largest 1 cm in short axis. Partly imaged larynx shows some increased soft tissue in mild narrowing of the airway.  Mediastinum and hila: Heart is normal in size. There are mild to moderate coronary artery calcifications. There is dilation of the ascending aorta measuring 4.6 cm x 4.5 cm in diameter. The aorta is mildly dilated along the arch and proximal descending portion. There are diffuse atherosclerotic calcifications throughout the thoracic aorta.  The multiple small mediastinal lymph nodes. There are no pathologically enlarged lymph nodes. No mediastinal masses. No hilar masses or adenopathy.  Lungs and pleura: There are small, right greater than left, bilateral pleural effusions. There are coarse reticular opacities in the right upper lobe with intervening emphysematous cystic spaces and some intervening hazy ground-glass type opacity. 4 mm noncalcified pleural-based nodule noted in the right upper lobe, image 30, series 4. Calcified granuloma is noted in the right lower lobe peripherally. There is mild atelectasis adjacent to the pleural effusions in both lower lobes there is no pulmonary edema. No pneumothorax. Mild emphysema is noted in the left upper  lobe.  Limited upper abdomen: Dense upper abdominal aortic calcification  and partly imaged proximal aspect of an aortic stent. Visualized portions of the liver, spleen, gallbladder and pancreas are unremarkable. No adrenal masses.  Musculoskeletal: There are lytic process cysts involving 3 discrete ribs, of the right posterior fourth rib, right posterior and lateral seventh rib and the left anterior and lateral fifth rib. The soft tissue mass surrounds the lytic process of the left antral lateral fifth rib. No other lytic lesions.  IMPRESSION: 1. There are coarse reticular opacities with intervening hazy ground-glass opacity and associated if edematous cystic change in the right upper lobe. This could all be chronic. Acute infectious or inflammatory infiltrate is possible. 2. No discrete pulmonary mass. 4 mm pleural-based nodule noted in the peripheral right upper lobe. No other suspicious nodules 3. Small, right greater than left, pleural effusions with associated dependent atelectasis. No pulmonary edema. 4. Neck base abnormalities. Patient also underwent neck CT. There is ill-defined soft tissue that involves and surrounds the thyroid gland. The thyroid appears enlarged and heterogeneous. There are multiple prominent neck base lymph nodes. 5. There are lytic lesions involving 3 discrete ribs. The left fifth rib also has associated soft tissue mass. This is highly suspicious for metastatic disease to bone.   Electronically Signed   By: Lajean Manes M.D.   On: 07/03/2014 16:39   Mr Jeri Cos ZC Contrast  07/06/2014   CLINICAL DATA:  75 year old male with metastatic disease detected on recent neck, chest CT and lumbar MRI. 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 Biopsy pending. Staging. Subsequent encounter.  EXAM: MRI HEAD WITHOUT AND WITH CONTRAST  TECHNIQUE: Multiplanar, multiecho pulse sequences of the brain and surrounding structures were obtained without and with intravenous contrast.  CONTRAST:  95m MULTIHANCE GADOBENATE DIMEGLUMINE 529 MG/ML IV SOLN  COMPARISON:   Neck CT 07/03/2014.  FINDINGS: Overlying the left superior frontal gyrus there is a 30 x 37 x 8 mm area of abnormal signal and enhancement involving the dura; pachy- and leptomeninges, and also the calvarium. 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 See series 7, image 23, post-contrast series 11, image 52 series 13, image 14 and series 12, image 12. Still, there is no associated cortical or cerebral edema, in the adjacent bone marrow signal appears normal, without an infiltrative osseous lesion in this area. 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  There is a superimposed left posterior fossa subdural hematoma measuring up to 11 mm in thickness which appears to be new since 07/03/2014. There is a hematocrit level associated. See series 6, image 7 and series 7, image 7. The hematoma wraps around the left cerebellar hemisphere, but there is no posterior fossa midline shift or basilar cistern effacement. There is also a left supratentorial subdural hematoma measuring up to 5-6 mm in thickness series 7, image 15 and series 10, image 11). Associated susceptibility artifact including on trace diffusion imaging.  Trace supratentorial rightward midline shift. No ventricular effacement. No other significant intracranial mass effect.  There is smooth thickening and hyperenhancement of the left tentorium (series 12, image 10) without associated leptomeningeal enhancement, likely reactive due to the subdural blood.  No other abnormal intracranial enhancement identified. No parenchymal tumor or metastasis identified.  Grossly negative visualized cervical spine and cervical spinal cord.  No restricted diffusion or evidence of acute infarction. No intraventricular hemorrhage or ventriculomegaly. Major intracranial vascular  flow voids are preserved, including the superior sagittal sinus which  is marginally affected by the left superior frontal lesion described in the first paragrah. Normal for age gray and white matter signal. Negative pituitary and cervicomedullary junction.  Visible internal auditory structures appear normal. Mastoids are clear. Trace paranasal sinus mucosal thickening. Visualized orbit soft tissues are within normal limits. Visualized scalp soft tissues are within normal limits. Bone marrow signal aside from that described at the left vertex is within normal limits.  IMPRESSION: 1. Left side subdural hematomas in both the posterior fossa and along the left hemisphere, appear to be new since the neck CT on 07/03/2014. 2. Abnormal appearance of a 3-4 cm area of the pachy- and leptomeninges at the left vertex over the left superior frontal gyrus. 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 In the absence of a known malignancy, the differential diagnosis of this dural abnormality would include a dural vascular malformation. 3. No cerebral or cerebellar edema. No significant intracranial mass effect at this time. 4. No other putative metastatic disease identified about the brain; smooth left tentorial dural thickening is likely reactive due to the hemorrhage. Study discussed by telephone with Dr. Cline Cools on 07/06/2014 at 1443 hours.   Electronically Signed   By: Genevie Ann M.D.   On: 07/06/2014 14:55   Mr Lumbar Spine Wo Contrast  07/04/2014   CLINICAL DATA:  Severe low back pain radiating into the right hip and leg. Symptoms for 1 year.  EXAM: MRI LUMBAR SPINE WITHOUT CONTRAST  TECHNIQUE: Multiplanar, multisequence MR imaging of the lumbar spine was performed. No intravenous contrast was administered.  COMPARISON:  CT abdomen and pelvis 01/07/2014. PET CT scan 08/17/2011.  FINDINGS: Multiple foci of  abnormal marrow signal are identified post consistent with metastatic disease or less likely multiple myeloma. L4 is completely replaced by tumor. A large lesion is also identified and L5 eccentric to the right extending into the right pedicle. Multiple additional lesions are seen including the posterior right and left ilium and sacrum. Paraspinous soft tissue structures demonstrate abnormal signal to the right of the L3 vertebral body extending into the psoas musculature and posterior paraspinous musculature inferiorly to the level of L4-5. Epidural tumor is seen from L3 to L5 with areas of extension out of both the vertebral body and posterior elements. The conus medullaris is normal in signal and position. Imaged intra-abdominal contents show partial visualization of abdominal aortic aneurysm which is been repaired.  The T11-12 level is imaged in the sagittal plane only and negative.  T12-L1: Small central protrusion is identified without central canal or foraminal narrowing.  L1-2:  Negative.  L2-3: Shallow disc bulge to the left without central canal or foraminal narrowing.  L3-4: Epidural tumor on the right extends into the right foramen and right paravertebral space and encroaches on the right L3 root and descending right L4 root. The left foramen appears open. There is also disc bulge this level.  L4-5: Epidural tumor extends out of the vertebral body narrowing the central canal with bulky tumor extension into the right paravertebral space and foramen encroaching on the exiting right L4 root.  L5-S1: Large epidural tumor deposits are seen extending out of the right lamina of L5 causing marked narrowing of the central canal and right lateral recess with encroachment on the descending and  exiting right L5 root. There is a shallow disc bulge at this level.  IMPRESSION: Findings consistent with multifocal metastatic disease. Extensive epidural and right paraspinous tumor is also present resulting in central  canal narrowing and encroachment on both descending and exiting nerve roots as described above. Most notably affected levels are from L3 to L5-S1 level.  Findings were discussed with Dr. Carmin Muskrat at the time of interpretation.   Electronically Signed   By: Inge Rise M.D.   On: 07/04/2014 10:02   US Soft Tissue Head/neck  07/06/2014   CLINICAL DATA:  Hyperthyroidism  EXAM: THYROID ULTRASOUND  TECHNIQUE: Ultrasound examination of the thyroid gland and adjacent soft tissues was performed.  COMPARISON:  None ; correlation CT soft tissue neck 07/03/2014  FINDINGS: Right thyroid lobe  Measurements: 6.2 x 4.3 x 2.5 cm. Tiny calcification 2 mm diameter within RIGHT lobe. Diffusely inhomogeneous echogenicity. No discrete mass.  Left thyroid lobe  Measurements: 5.4 x 2.6 x 3.2 cm. Heterogeneous echogenicity without discrete mass.  Isthmus  Thickness: 15 mm thick, thickened. Inhomogeneous echogenicity without focal mass.  Lymphadenopathy  Numerous lymph nodes identified in the cervical regions bilaterally up to 10 mm short axis diameters.  IMPRESSION: Enlarged inhomogeneous thyroid gland with a single nonspecific calcification in the RIGHT lobe.  No discrete thyroid mass identified.  Scattered cervical lymph nodes normal to mildly enlarged in sizes.  Patient received IV contrast material on 07/03/2014 and again on 07/05/2014; the iodine load within these contrast administrations will prevent performance of an accurate radionuclide thyroid uptake and scan as well as potential radioactive iodine therapy for hyperthyroidism for 6-8 weeks.   Electronically Signed   By: Lavonia Dana M.D.   On: 07/06/2014 13:30   Ct Abdomen Pelvis W Contrast  07/05/2014   CLINICAL DATA:  Continued staging of newly diagnosed bone lesions. WBC=16.3*  EXAM: CT ABDOMEN AND PELVIS WITH CONTRAST  TECHNIQUE: Multidetector CT imaging of the abdomen and pelvis was performed using the standard protocol following bolus administration of  intravenous contrast.  CONTRAST:  23m OMNIPAQUE IOHEXOL 300 MG/ML SOLN, 1078mOMNIPAQUE IOHEXOL 300 MG/ML SOLN  COMPARISON:  01/07/2014  FINDINGS: Moderate right small left pleural effusions. There is significant left right lower lobe atelectasis. No pulmonary edema. Heart is normal in size. No lung base mass or discrete nodule.  Liver, spleen, gallbladder, pancreas, adrenal glands:  Unremarkable.  Small low-density renal lesions, most likely cysts. Relative area of decreased enhancement in the inferior medial aspect the left kidney, which is stable from the prior CT. Small cysts have increased in size since the prior study. No other renal abnormality. No hydronephrosis. Normal ureters. Bladder is unremarkable.  Excluded infrarenal abdominal aortic aneurysm. Native aneurysm measures 6.2 cm x 4.9 cm transversely. 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 These findings are relatively stable.  No pathologically enlarged lymph nodes.  No ascites.  Colon and small bowel are unremarkable. Appendix not convincingly seen.  Degenerative changes noted of the lumbar spine. There is a mottled appearance of vertebrae, but no discrete osteoblastic or osteolytic lesion. The widespread lesions evident on MRI are not well resolved high CT.  IMPRESSION: 1. No acute finding within the abdomen pelvis. 2. No extraosseous metastatic disease or evidence of a primary neoplasm. 3. 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 The visible vertebrae show a subtle heterogeneous pattern without a discrete osteoblastic or osteolytic lesion. 4. Moderate right and small to  moderate left pleural effusions with significant associated lower lobe atelectasis. No lung base nodules.   Electronically Signed   By: Lajean Manes M.D.   On: 07/05/2014 09:39      IMPRESSION/PLAN: Today, I talked to the patient about the findings and work-up thus far. We discussed the patient's diagnosis  of putative metastatic cancer and general treatment for this, highlighting the role of radiotherapy in the management. We discussed the available radiation techniques, and focused on the details of logistics and delivery.    We discussed the risks, benefits, and side effects of palliative radiotherapy. First we need the biopsy results.  If he has a diagnosis of cancer, which is likely, RT would be palliative, not curative, as we discussed.  We discussed treating the neck, the ribs where he has pain, the spine, and possibly the right hip /pelvis (suspect bony mets there as well based on history and exam).    I recommend a bone scan to discern sites of bone disease and to guide targeted radiation therapy. His Bone lesions don't show up well on CT scans. I talked with Dr Grandville Silos who is graciously ordering this.  Side effects of RT may include but not necessarily be limited to: fatigue, skin irritation, esophagitis, GI upset, laryngeal swelling, and possibly need for trachestomy if airway becomes compromised.   We talked about the MRI findings; I do not recommend treating the area of dural/calvarial changes as the etiology of those changes is unclear and the patient denies symptoms there.  No guarantees of treatment were given. A consent form was signed and placed in the patient's medical record.  The patient was encouraged to ask questions that I answered to the best of my ability. He inquires about his ability to work. I said that I cannot imagine he will be able to work before the fall semester; once his diagnosis is established and treatments begin, I suggested we regroup and discuss this issues further to see if retirement is prudent.  Will schedule treatment planning session for Wed, May 11, in hopes that prelim path report will be available by then. __________________________________________   Eppie Gibson, MD

## 2014-07-07 NOTE — Progress Notes (Signed)
OT Cancellation Note  Patient Details Name: [name] [name] MRN: [id number] DOB: 14-Aug-1940   Cancelled Treatment:    Reason Eval/Treat Not Completed: Patient at procedure or test/ unavailable (Undergoing biopsy.) Will assess tomorrow if able.   [place]on, OTR/L  [phone] 07/07/2014 07/07/2014, 12:22 PM

## 2014-07-08 ENCOUNTER — Inpatient Hospital Stay (HOSPITAL_COMMUNITY): Payer: BC Managed Care – PPO

## 2014-07-08 DIAGNOSIS — S065X0D Traumatic subdural hemorrhage without loss of consciousness, subsequent encounter: Secondary | ICD-10-CM

## 2014-07-08 DIAGNOSIS — J189 Pneumonia, unspecified organism: Secondary | ICD-10-CM

## 2014-07-08 LAB — CBC WITH DIFFERENTIAL/PLATELET
BASOS ABS: 0 10*3/uL (ref 0.0–0.1)
Basophils Relative: 0 % (ref 0–1)
Eosinophils Absolute: 0.2 10*3/uL (ref 0.0–0.7)
Eosinophils Relative: 1 % (ref 0–5)
HEMATOCRIT: 27.4 % — AB (ref 39.0–52.0)
Hemoglobin: 9 g/dL — ABNORMAL LOW (ref 13.0–17.0)
Lymphocytes Relative: 6 % — ABNORMAL LOW (ref 12–46)
Lymphs Abs: 1.3 10*3/uL (ref 0.7–4.0)
MCH: 31 pg (ref 26.0–34.0)
MCHC: 32.8 g/dL (ref 30.0–36.0)
MCV: 94.5 fL (ref 78.0–100.0)
Monocytes Absolute: 1.9 10*3/uL — ABNORMAL HIGH (ref 0.1–1.0)
Monocytes Relative: 8 % (ref 3–12)
NEUTROS ABS: 19.8 10*3/uL — AB (ref 1.7–7.7)
Neutrophils Relative %: 85 % — ABNORMAL HIGH (ref 43–77)
PLATELETS: 167 10*3/uL (ref 150–400)
RBC: 2.9 MIL/uL — ABNORMAL LOW (ref 4.22–5.81)
RDW: 14.1 % (ref 11.5–15.5)
WBC: 23.2 10*3/uL — AB (ref 4.0–10.5)

## 2014-07-08 LAB — COMPREHENSIVE METABOLIC PANEL
ALBUMIN: 2.7 g/dL — AB (ref 3.5–5.0)
ALK PHOS: 59 U/L (ref 38–126)
ALT: 46 U/L (ref 17–63)
AST: 41 U/L (ref 15–41)
Anion gap: 9 (ref 5–15)
BILIRUBIN TOTAL: 1 mg/dL (ref 0.3–1.2)
BUN: 33 mg/dL — ABNORMAL HIGH (ref 6–20)
CHLORIDE: 108 mmol/L (ref 101–111)
CO2: 20 mmol/L — AB (ref 22–32)
CREATININE: 0.5 mg/dL — AB (ref 0.61–1.24)
Calcium: 7.9 mg/dL — ABNORMAL LOW (ref 8.9–10.3)
GFR calc Af Amer: >60 mL/min (ref >60)
Glucose, Bld: 112 mg/dL — ABNORMAL HIGH (ref 70–99)
POTASSIUM: 4.2 mmol/L (ref 3.5–5.1)
Sodium: 137 mmol/L (ref 135–145)
Total Protein: 5.3 g/dL — ABNORMAL LOW (ref 6.5–8.1)

## 2014-07-08 LAB — HCG, SERUM, QUALITATIVE: PREG SERUM: NEGATIVE

## 2014-07-08 LAB — GLUCOSE, CAPILLARY: GLUCOSE-CAPILLARY: 100 mg/dL — AB (ref 70–99)

## 2014-07-08 LAB — HEPARIN LEVEL (UNFRACTIONATED)

## 2014-07-08 LAB — T3, FREE: T3, Free: 13.4 pg/mL — ABNORMAL HIGH (ref 2.0–4.4)

## 2014-07-08 LAB — THYROID PEROXIDASE ANTIBODY: Thyroperoxidase Ab SerPl-aCnc: 22 IU/mL (ref 0–34)

## 2014-07-08 LAB — STREP PNEUMONIAE URINARY ANTIGEN: Strep Pneumo Urinary Antigen: NEGATIVE

## 2014-07-08 LAB — THYROGLOBULIN ANTIBODY: Thyroglobulin Antibody: <1 IU/mL (ref 0.0–0.9)

## 2014-07-08 LAB — MAGNESIUM: Magnesium: 2.4 mg/dL (ref 1.7–2.4)

## 2014-07-08 MED ORDER — HEPARIN (PORCINE) IN NACL 100-0.45 UNIT/ML-% IJ SOLN
750.0000 [IU]/h | INTRAMUSCULAR | Status: DC
  Administered 2014-07-08: 750 [IU]/h via INTRAVENOUS
  Filled 2014-07-08: qty 250

## 2014-07-08 MED ORDER — LEVALBUTEROL HCL 0.63 MG/3ML IN NEBU: 0.6300 mg | INHALATION_SOLUTION | Freq: Four times a day (QID) | RESPIRATORY_TRACT | Status: DC

## 2014-07-08 MED ORDER — IPRATROPIUM BROMIDE 0.02 % IN SOLN: 0.5000 mg | Freq: Four times a day (QID) | RESPIRATORY_TRACT | Status: DC

## 2014-07-08 MED ORDER — METOPROLOL TARTRATE 25 MG/10 ML ORAL SUSPENSION: 75.0000 mg | Freq: Two times a day (BID) | ORAL | Status: DC

## 2014-07-08 MED ORDER — GUAIFENESIN 100 MG/5ML PO SOLN: 5.0000 mL | ORAL | Status: DC | PRN

## 2014-07-08 MED ORDER — METOPROLOL TARTRATE 1 MG/ML IV SOLN: 2.5000 mg | Freq: Four times a day (QID) | INTRAVENOUS | Status: DC

## 2014-07-08 MED ORDER — VANCOMYCIN HCL IN DEXTROSE 750-5 MG/150ML-% IV SOLN
750.0000 mg | Freq: Two times a day (BID) | INTRAVENOUS | Status: DC
  Administered 2014-07-08 – 2014-07-09 (×4): 750 mg via INTRAVENOUS
  Filled 2014-07-08 (×5): qty 150

## 2014-07-08 MED ORDER — IPRATROPIUM-ALBUTEROL 0.5-2.5 (3) MG/3ML IN SOLN: 3.0000 mL | Freq: Four times a day (QID) | RESPIRATORY_TRACT | Status: DC | PRN

## 2014-07-08 MED ORDER — HEPARIN (PORCINE) IN NACL 100-0.45 UNIT/ML-% IJ SOLN
900.0000 [IU]/h | INTRAMUSCULAR | Status: DC
Start: 2014-07-08 — End: 2014-07-09
  Filled 2014-07-08: qty 250

## 2014-07-08 MED ORDER — LEVALBUTEROL HCL 0.63 MG/3ML IN NEBU: 0.6300 mg | INHALATION_SOLUTION | RESPIRATORY_TRACT | Status: DC | PRN

## 2014-07-08 MED ORDER — IPRATROPIUM BROMIDE 0.02 % IN SOLN: 0.5000 mg | RESPIRATORY_TRACT | Status: DC | PRN

## 2014-07-08 MED ORDER — GUAIFENESIN ER 600 MG PO TB12
1200.0000 mg | ORAL_TABLET | Freq: Two times a day (BID) | ORAL | Status: DC
  Administered 2014-07-08 – 2014-07-15 (×15): 1200 mg via ORAL
  Filled 2014-07-08 (×20): qty 2

## 2014-07-08 MED ORDER — PROPRANOLOL HCL 60 MG PO TABS
60.0000 mg | ORAL_TABLET | Freq: Four times a day (QID) | ORAL | Status: DC
  Administered 2014-07-08 – 2014-07-18 (×39): 60 mg via ORAL
  Filled 2014-07-08 (×44): qty 1

## 2014-07-08 MED ORDER — PROPRANOLOL HCL 60 MG PO TABS
60.0000 mg | ORAL_TABLET | ORAL | Status: AC
  Administered 2014-07-08: 60 mg via ORAL
  Filled 2014-07-08: qty 1

## 2014-07-08 MED ORDER — TECHNETIUM TC 99M MEDRONATE IV KIT
25.1000 | PACK | Freq: Once | INTRAVENOUS | Status: AC | PRN
  Administered 2014-07-08: 25.1 via INTRAVENOUS

## 2014-07-08 MED ORDER — PROPRANOLOL HCL 60 MG PO TABS
60.0000 mg | ORAL_TABLET | Freq: Four times a day (QID) | ORAL | Status: DC
  Filled 2014-07-08 (×3): qty 1

## 2014-07-08 MED ORDER — DOCUSATE SODIUM 50 MG/5ML PO LIQD
100.0000 mg | Freq: Two times a day (BID) | ORAL | Status: DC
  Administered 2014-07-08 – 2014-07-18 (×13): 100 mg via ORAL
  Filled 2014-07-08 (×21): qty 10

## 2014-07-08 NOTE — Procedures (Addendum)
Other Pertinent Information: 74 year old male with a known diagnosis of COPD who came to the hospital because of some weight loss, shortness of breath, and back pain. He was found to have CT imaging findings suggestive of widespread metastasis from an uncertain primary malignancy.  Pt was admitted for further workup with oncology and ENT per MD notes.  Per CT neck, pt with massive enlargement of thyroid lesion (left lobe) with phlegmon/inflammation 07/03/14.   Difficulty swallowing food more than drink reported by pt -progressing within the last few weeks.  Pt states swelling in neck present x1 month but progressed in 2 weeks.  Esophagram completed with pt having aspiration - esophagus negative.  Pt now with pna per CXR.  MD reordered SLP swallow evaluation due to pt coughing with intake.    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  Gross pharyngeal stasis noted with minimal barium transiting into esophagus and aspiration.  Multtiple strategies attempted to aid clearance including head turn, chin tuck, following solids with liquids, etc.  Fortunately pt was able to expectorate barium mixed with secretions throughout the study (expectorating approximately 90% of boluses).  He did aspirate a small amount of liquids without protective cough response.  Although pt did not aspirate thicker consistencies, he is at high risk due to gross residuals he can not clear.    Pt is at very high malnutrition and aspiration risk due to his level of dysphagia.  ? if pt would benefit from consideration for alternative means of nutrition for nutriional support as he goes through treatment.  Using live video, SLP educated pt to findings and concerns for aspiraton and  nutrition.    Recommend consider NPO except ice and water to decrease disuse muscle atrophy.   Hopeful for improvement in swallowing with cancer treatment.                 [facility] IP DIET RECOMMENDATION 07/08/2014  SLP Diet Recommendations NPO except water/ice to prevent disuse muscle atrophy     Medication Administration Via alternative means      Luanna Salk, [name] SLP [phone]  [phone] Note pt conversation with MD - pt desires to eat with known aspiration risks.  SLP to follow up briefly for pt education to mitigate dysphagia.

## 2014-07-08 NOTE — Clinical Social Work Placement (Signed)
   CLINICAL SOCIAL WORK PLACEMENT  NOTE  Date:  07/08/2014  Patient Details  Name: [name] [name] MRN: [id number] Date of Birth: 1940/06/17  Clinical Social Work is seeking post-discharge placement for this patient at the [place] level of care (*CSW will initial, date and re-position this form in  chart as items are completed):  Yes   Patient/family provided with [facility] Department's list of facilities offering this level of care within the geographic area requested by the patient (or if unable, by the patient's family).  Yes   Patient/family informed of their freedom to choose among providers that offer the needed level of care, that participate in Medicare, Medicaid or managed care program needed by the patient, have an available bed and are willing to accept the patient.  Yes   Patient/family informed of [facility]'s ownership interest in [facility] and [facility], as well as of the fact that they are under no obligation to receive care at these facilities.  PASRR submitted to EDS on 07/08/14     PASRR number received on 07/08/14     Existing PASRR number confirmed on       FL2 transmitted to all facilities in geographic area requested by pt/family on 07/08/14     FL2 transmitted to all facilities within larger geographic area on       Patient informed that his/her managed care company has contracts with or will negotiate with certain facilities, including the following:            Patient/family informed of bed offers received.  Patient chooses bed at       Physician recommends and patient chooses bed at      Patient to be transferred to   on  .  Patient to be transferred to facility by       Patient family notified on   of transfer.  Name of family member notified:        PHYSICIAN Please sign FL2     Additional Comment:    _______________________________________________ Ladell Pier, LCSW 07/08/2014, 11:56  AM

## 2014-07-08 NOTE — Progress Notes (Addendum)
[facility] PROGRESS NOTE  DAVARIUS RIDENER EVO:[id number] DOB: 1940/07/26 DOA: 07/04/2014 PCP: [name], MD  Assessment/Plan: #1 metastatic disease CT scan and MRI of the L-spine showing multiple abnormalities consistent with metastatic malignancy. CT abdomen and pelvis showing pleural effusion right greater than left. Patient complaining of hip pain. Patient had MRI done which showed left subdural hematomas. Unable to discern as to whether patient had metastatic disease per report. Patient s/p biopsy yesterday off lymph nodes results are pending.  Continue current pain regimen. Per oncology.   #2 pleural effusion Noted on CT of the abdomen and pelvis. Patient has been seen by pulmonary and recommend following pleural effusions with daily chest x-rays and if increases in size or worsening shortness of breath that a thoracentesis will need to be performed. Patient has been placed on DuoNeb's to help with mucociliary clearance as well as Mucinex and a flutter valve. Pulmonary following.  #3 left subdural hematomas Case was discussed with Dr. Arnoldo Morale of neurosurgery who reviewed the films, and recommended discontinuing patient's Lovenox which he was on for subclavian thrombus. Patient is neurologically intact. Repeat head CT yesterday with no significant change with subdural hematoma. See problem #4.  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 Spoke with neurosurgery who recommended discontinuing Lovenox would repeat head CT pending today. Spoke with hematology/oncology who felt patient needed to be anticoagulated however on low-dose heparin with close neuro checks. Patient status post biopsy yesterday, and will place on low-dose heparin today. Monitor closely will need a repeat CT of the head in 48 hours to follow-up on subdural hematomas. Hematology/ oncology following.   #4 healthcare associated pneumonia Patient noted to aspirate  during barium swallow study yesterday. Patient with a worsening leukocytosis. Chest x-ray done last night consistent with a left lower lobe airspace consolidation. Patient is currently immunocompromised. Patient was started on IV Zosyn last night. Will place on IV vancomycin. Mucinex. Nebulizer treatments. Follow.  #5 dysphagia/shortness of breath Likely secondary to enlarged thyroid. Patient speaking in full sentences. Patient has been seen by ENT who feels there is no evidence of thyroid abscess or cellulitis. ENT recommending possible needle aspiration of left lobe of thyroid for cytology under ultrasound guidance. Patient s/p biopsy of lymph nodes yesterday, per interventional radiology. Barium swallow with suboptimal exam due to immediate aspiration which elicited cough reflex, no esophageal mass or stricture identified. Patient states he tolerated a soft diet this morning using aspiration precautions and sitting upright. Patient for modified barium swallow per speech therapy today. Continue current diet.  #6 hyperthyroidism/enlarged thyroid Questionable etiology. Patient here with concern for metastatic disease. Thyroid function studies with a TSH of 0.016. Free T4-5 0.65. Total T3 of 348, reverse T3 was 117, thyroglobulin antibodies less than 1.0. Thyroid peroxidase antibody within normal limits at 22. Patient has been seen in consultation by endocrinology and labs have been ordered and are pending. Patient has been started on methimazole per endocrinology. Patient also on Decadron. Change Lopressor to oral propranolol 60 mg every 6 hours for better rate control and titrate as needed. Thyroid ultrasound with enlarged inhomogeneous thyroid gland with a single nonspecific calcification the right lobe. No discrete thyroid mass identified. Scattered cervical lymph nodes normal to mildly enlarged in sizes. Endocrinology following and appreciate input and recommendations.   #7 Carious dentition Per nursing  hospital no longer does Panorex films. Will need a dental consultation, may need to do it as outpatient if dentistry  no longer comes into the hospital.  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 Check FOBT. Follow H&H.  #9 leukocytosis Likely secondary to healthcare associated pneumonia noted on chest x-ray from 07/07/2014 and steriod induced. Leukocytosis worsened. Patient has been pancultured and results pending. Patient was started on IV Zosyn last night. Will place on IV vancomycin as well as patient is immunocompromised. Follow.  #10 epidural mass/spinal mass Patient has been started on steroids per oncology. Pain management. Per oncology.  #11 hypertension Continue benazepril. Change Lopressor to propranolol. Follow and titrate as needed.  #12 history of rheumatoid arthritis Continue to hold Plaquenil and methotrexate.  #13 prophylaxis PPI for GI prophylaxis. Lovenox for DVT prophylaxis.  Code Status: Full Family Communication: Updated patient, no family at bedside. Disposition Plan: Remain in ICU   Consultants:  Oncology: Dr. Marin Olp 09/32/[id number]  ENT: Dr Erik Obey 32/20/2542  Endocrinology: Dr. Buddy Duty 07/06/2014  Pulmonary: Dr. Lake Bells 07/05/2014  Procedures:  CT abdomen and pelvis 07/05/2014  MRI head 07/06/2014  Thyroid ultrasound 07/06/2014  Ultrasound-guided biopsy of left neck lymph nodes per Dr. Earleen Newport interventional radiology 07/07/2014  Barium swallow 07/07/2014  CT head 07/07/2014  Chest x-ray 07/07/2014  Antibiotics:  IV Zosyn 07/07/2014  IV vancomycin 07/08/2014  HPI/Subjective: Patient states he is tired. Patient states was able to tolerate soft diet this morning. Patient denies any emesis. Patient complaining of bleeding that right upper extremity side where IV was tried. Patient with complaints of some shortness of breath.  Objective: Filed Vitals:   07/08/14 0800  BP:   Pulse:   Temp: 97.8 F (36.6 C)  Resp:      Intake/Output Summary (Last 24 hours) at 07/08/14 0901 Last data filed at 07/08/14 0100  Gross per 24 hour  Intake    850 ml  Output    700 ml  Net    150 ml   Filed Weights   07/04/14 1422 07/05/14 0400 07/08/14 0500  Weight: 58.5 kg (128 lb 15.5 oz) 61 kg (134 lb 7.7 oz) 63.2 kg (139 lb 5.3 oz)    Exam:   General:  NAD  Cardiovascular: Tachycardia  Respiratory: Decreased breath sounds in the right base. No crackles. No rhonchi.  Abdomen: Soft, nontender, nondistended, positive bowel sounds.  Musculoskeletal: No clubbing cyanosis or edema.  Data Reviewed: Basic Metabolic Panel:  Recent Labs Lab 07/04/14 1154 07/04/14 1600 07/05/14 0347 07/06/14 0405 07/07/14 0401 07/08/14 0400  NA 135  --  135 137 136 137  K 4.6  --  4.4 3.8 4.2 4.2  CL 105  --  108 110 110 108  CO2 21*  --  19* 19* 18* 20*  GLUCOSE 99  --  149* 104* 122* 112*  BUN 32*  --  32* 37* 31* 33*  CREATININE 0.51* 0.56* 0.44* 0.47* 0.49* 0.50*  CALCIUM 9.1  --  8.5* 8.4* 8.1* 7.9*  MG  --  2.2  --   --  2.5* 2.4   Liver Function Tests:  Recent Labs Lab 07/04/14 1154 07/05/14 0347 07/08/14 0400  AST 34 30 41  ALT 32 34 46  ALKPHOS 66 68 59  BILITOT 1.1 1.0 1.0  PROT 5.8* 5.8* 5.3*  ALBUMIN 3.2* 3.0* 2.7*   No results for input(s): LIPASE, AMYLASE in the last 168 hours. No results for input(s): AMMONIA in the last 168 hours. CBC:  Recent Labs Lab 07/04/14 1154 07/04/14 1600 07/05/14 0347 07/06/14 0405 07/07/14 0401 07/08/14 0400  WBC 20.0* 20.5* 16.3* 26.0* 20.9*  23.2*  NEUTROABS 15.0*  --   --   --  17.7* 19.8*  HGB 10.4* 10.2* 10.3* 9.6* 8.8* 9.0*  HCT 31.4* 30.3* 31.0* 29.3* 26.3* 27.4*  MCV 94.9 94.7 93.9 95.1 94.6 94.5  PLT 162 152 160 182 172 167   Cardiac Enzymes: No results for input(s): CKTOTAL, CKMB, CKMBINDEX, TROPONINI in the last 168 hours. BNP (last 3 results) No results for input(s): BNP in the last 8760 hours.  ProBNP (last 3 results) No results for  input(s): PROBNP in the last 8760 hours.  CBG:  Recent Labs Lab 07/05/14 0841 07/06/14 0755 07/07/14 0814 07/08/14 0805  GLUCAP 143* 102* 106* 100*    Recent Results (from the past 240 hour(s))  Culture, blood (routine x 2)     Status: None (Preliminary result)   Collection Time: 07/04/14  1:54 PM  Result Value Ref Range Status   Specimen Description BLOOD LEFT ANTECUBITAL  Final   Special Requests BOTTLES DRAWN AEROBIC AND ANAEROBIC 5 CC EA  Final   Culture   Final           BLOOD CULTURE RECEIVED NO GROWTH TO DATE CULTURE WILL BE HELD FOR 5 DAYS BEFORE ISSUING A FINAL NEGATIVE REPORT Performed at [facility]    Report Status PENDING  Incomplete  Culture, blood (routine x 2)     Status: None (Preliminary result)   Collection Time: 07/04/14  1:55 PM  Result Value Ref Range Status   Specimen Description BLOOD BLOOD RIGHT FOREARM  Final   Special Requests BOTTLES DRAWN AEROBIC AND ANAEROBIC 5 CC EA  Final   Culture   Final           BLOOD CULTURE RECEIVED NO GROWTH TO DATE CULTURE WILL BE HELD FOR 5 DAYS BEFORE ISSUING A FINAL NEGATIVE REPORT Performed at [facility]    Report Status PENDING  Incomplete  Culture, Urine     Status: None   Collection Time: 07/04/14  1:59 PM  Result Value Ref Range Status   Specimen Description URINE, RANDOM  Final   Special Requests NONE  Final   Colony Count NO GROWTH Performed at [facility]   Final   Culture NO GROWTH Performed at [facility]   Final   Report Status 07/05/2014 FINAL  Final  MRSA PCR Screening     Status: None   Collection Time: 07/04/14  2:42 PM  Result Value Ref Range Status   MRSA by PCR NEGATIVE NEGATIVE Final    Comment:        The GeneXpert MRSA Assay (FDA approved for NASAL specimens only), is one component of a comprehensive MRSA colonization surveillance program. It is not intended to diagnose MRSA infection nor to guide or monitor treatment for MRSA infections.       Studies: Ct Head Wo Contrast  07/07/2014   CLINICAL DATA:  Subdural hematoma.  EXAM: CT HEAD WITHOUT CONTRAST  TECHNIQUE: Contiguous axial images were obtained from the base of the skull through the vertex without intravenous contrast.  COMPARISON:  MRI scan of Jul 06, 2014.  FINDINGS: Bony calvarium appears intact. Mild diffuse cortical atrophy is noted. Mild chronic ischemic white matter disease is noted. Ventricular size is within normal limits. No midline shift or significant mass effect is noted. 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 There is also noted probable  subdural hematoma in the left posterior fossa as described on prior exam. Also noted is left frontal subdural abnormality measuring 5.4 mm in thickness also consistent with hematoma.  IMPRESSION: Mild diffuse cortical atrophy. Mild chronic ischemic white matter disease.  Left frontal and posterior fossa subdural hematomas are noted as described on prior MRI.  High density material is noted in left parafalcine region for the vertex which may represent hemorrhage or leptomeningeal neoplastic disease as described on prior MRI.   Electronically Signed   By: Marijo Conception, M.D.   On: 07/07/2014 12:08   Mr Jeri Cos RX Contrast  07/06/2014   CLINICAL DATA:  74 year old male with metastatic disease detected on recent neck, chest CT and lumbar MRI. 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 Biopsy pending. Staging. Subsequent encounter.  EXAM: MRI HEAD WITHOUT AND WITH CONTRAST  TECHNIQUE: Multiplanar, multiecho pulse sequences of the brain and surrounding structures were obtained without and with intravenous contrast.  CONTRAST:  89m MULTIHANCE GADOBENATE DIMEGLUMINE 529 MG/ML IV SOLN  COMPARISON:  Neck CT 07/03/2014.  FINDINGS: Overlying the left superior frontal gyrus there is a 30 x 37 x  8 mm area of abnormal signal and enhancement involving the dura; pachy- and leptomeninges, and also the calvarium. 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 See series 7, image 23, post-contrast series 11, image 52 series 13, image 14 and series 12, image 12. Still, there is no associated cortical or cerebral edema, in the adjacent bone marrow signal appears normal, without an infiltrative osseous lesion in this area. 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  There is a superimposed left posterior fossa subdural hematoma measuring up to 11 mm in thickness which appears to be new since 07/03/2014. There is a hematocrit level associated. See series 6, image 7 and series 7, image 7. The hematoma wraps around the left cerebellar hemisphere, but there is no posterior fossa midline shift or basilar cistern effacement. There is also a left supratentorial subdural hematoma measuring up to 5-6 mm in thickness series 7, image 15 and series 10, image 11). Associated susceptibility artifact including on trace diffusion imaging.  Trace supratentorial rightward midline shift. No ventricular effacement. No other significant intracranial mass effect.  There is smooth thickening and hyperenhancement of the left tentorium (series 12, image 10) without associated leptomeningeal enhancement, likely reactive due to the subdural blood.  No other abnormal intracranial enhancement identified. No parenchymal tumor or metastasis identified.  Grossly negative visualized cervical spine and cervical spinal cord.  No restricted diffusion or evidence of acute infarction. No intraventricular hemorrhage or ventriculomegaly. Major intracranial vascular flow voids are preserved, including the superior sagittal sinus which is marginally affected by the left superior frontal lesion described in the first paragrah.  Normal for age gray and white matter signal. Negative pituitary and cervicomedullary junction.  Visible internal auditory structures appear normal. Mastoids are clear. Trace paranasal sinus mucosal thickening. Visualized orbit soft tissues are within normal limits. Visualized scalp soft tissues are within normal limits. Bone marrow signal aside from that described at the left vertex is within normal limits.  IMPRESSION: 1. Left side subdural hematomas in both the posterior fossa and along the left hemisphere, appear to be new since the neck CT on 07/03/2014. 2. Abnormal appearance of a 3-4 cm area of the pachy- and leptomeninges at the left vertex over the left superior frontal gyrus. Given the  possibility of metastatic thyroid carcinoma in this patient, this constellation may reflect a dural metastasis from thyroid cancer, and thyroid metastases in the brain are prone to hemorrhage - which might explain #1. In the absence of a known malignancy, the differential diagnosis of this dural abnormality would include a dural vascular malformation. 3. No cerebral or cerebellar edema. No significant intracranial mass effect at this time. 4. No other putative metastatic disease identified about the brain; smooth left tentorial dural thickening is likely reactive due to the hemorrhage. Study discussed by telephone with Dr. Cline Cools on 07/06/2014 at 1443 hours.   Electronically Signed   By: Genevie Ann M.D.   On: 07/06/2014 14:55   US Soft Tissue Head/neck  07/06/2014   CLINICAL DATA:  Hyperthyroidism  EXAM: THYROID ULTRASOUND  TECHNIQUE: Ultrasound examination of the thyroid gland and adjacent soft tissues was performed.  COMPARISON:  None ; correlation CT soft tissue neck 07/03/2014  FINDINGS: Right thyroid lobe  Measurements: 6.2 x 4.3 x 2.5 cm. Tiny calcification 2 mm diameter within RIGHT lobe. Diffusely inhomogeneous echogenicity. No discrete mass.  Left thyroid lobe  Measurements: 5.4 x 2.6 x 3.2 cm. Heterogeneous  echogenicity without discrete mass.  Isthmus  Thickness: 15 mm thick, thickened. Inhomogeneous echogenicity without focal mass.  Lymphadenopathy  Numerous lymph nodes identified in the cervical regions bilaterally up to 10 mm short axis diameters.  IMPRESSION: Enlarged inhomogeneous thyroid gland with a single nonspecific calcification in the RIGHT lobe.  No discrete thyroid mass identified.  Scattered cervical lymph nodes normal to mildly enlarged in sizes.  Patient received IV contrast material on 07/03/2014 and again on 07/05/2014; the iodine load within these contrast administrations will prevent performance of an accurate radionuclide thyroid uptake and scan as well as potential radioactive iodine therapy for hyperthyroidism for 6-8 weeks.   Electronically Signed   By: Lavonia Dana M.D.   On: 07/06/2014 13:30   Dg Esophagus  07/07/2014   CLINICAL DATA:  Severe dysphagia. Metastatic disease of unknown primary.  EXAM: ESOPHOGRAM/BARIUM SWALLOW  TECHNIQUE: Single contrast examination was performed using  thin barium.  FLUOROSCOPY TIME:  Fluoroscopy Time:  2 minutes 36 seconds  Number of Acquired Images:  7  COMPARISON:  None.  FINDINGS: A small amount of aspiration of barium was immediately seen, which elicited a cough reflex. This limited ability to continue with the exam. Small amount of administered barium shows no evidence of esophageal mass or stricture, although esophagus could not be completely distended. No hiatal hernia visualized.  IMPRESSION: Suboptimal exam due to immediate aspiration, which elicited a cough reflex. No esophageal mass or stricture identified.   Electronically Signed   By: Earle Gell M.D.   On: 07/07/2014 16:35   US Biopsy  07/07/2014   CLINICAL DATA:  74 year old male with a history of lymphadenopathy, concern for thyroid cancer metastasis  EXAM: ULTRASOUND GUIDED CORE BIOPSY OF LEFT CERVICAL LYMPH NODES  MEDICATIONS: 1.0 mg IV Versed; 0 mcg IV Fentanyl  Total Moderate Sedation  Time: 0  PROCEDURE: The procedure, risks, benefits, and alternatives were explained to the patient. Questions regarding the procedure were encouraged and answered. The patient understands and consents to the procedure.  Ultrasound survey of the left neck was performed with images stored and sent to PACs.  The left neck was prepped with Betadine in a sterile fashion, and a sterile drape was applied covering the operative field. A sterile gown and sterile gloves were used for the procedure. Local anesthesia was provided  with 1% Lidocaine.  Once the patient was prepped and draped sterilely, the skin and subcutaneous tissues were generously infiltrated with 1% lidocaine without epinephrine. Ultrasound guidance was used to infiltrate the skin and subcutaneous tissues to the level of the targeted nodes.  A small skin incision was made with 11 blade scalpel, and then and 18 gauge core biopsy gun was used to retrieve 5 separate 18 gauge core biopsy. Each of these was observed under ultrasound.  Tissue specimen sent to pathology in saline.  The patient tolerated the procedure well and remained hemodynamically stable throughout.  No complications were encountered and no significant blood loss was encountered.  Sterile bandage was placed.  EBL = 0  COMPLICATIONS: None.  FINDINGS: Ultrasound survey demonstrates multiple abnormal nodes along the left cervical chain.  Images during the case demonstrate needle placement within the nodes on each pass.  Final image demonstrates no complicating features.  IMPRESSION: Status post ultrasound-guided biopsy of left cervical lymph nodes. Specimen sent to pathology for complete histopathologic analysis.  Signed,  Dulcy Fanny. Earleen Newport, DO  [facility]  [facility]   Electronically Signed   By: Corrie Mckusick D.O.   On: 07/07/2014 13:43   Dg Chest Port 1 View  07/07/2014   CLINICAL DATA:  Aspiration.  Hypoxia.  Evaluate for pneumonia.  EXAM:  PORTABLE CHEST - 1 VIEW  COMPARISON:  07/06/2014  FINDINGS: Normal heart size. There is calcified atherosclerosis involving the thoracic aorta. Bilateral pleural effusions are identified. There is mild diffuse interstitial edema. Airspace consolidation with air bronchograms are noted in the left lower lobe.  IMPRESSION: 1. Left lower lobe airspace consolidation. 2. Bilateral pleural effusions and interstitial edema consistent with pneumonia.   Electronically Signed   By: Kerby Moors M.D.   On: 07/07/2014 21:07    Scheduled Meds: . acetaminophen  500 mg Oral TID  . antiseptic oral rinse  7 mL Mouth Rinse BID  . benazepril  40 mg Oral Daily  . budesonide-formoterol  2 puff Inhalation BID  . dexamethasone  20 mg Intravenous Q24H  . docusate sodium  100 mg Oral BID  . feeding supplement (RESOURCE BREEZE)  1 Container Oral TID BM  . guaiFENesin  1,200 mg Oral BID  . methimazole  60 mg Oral Daily  . metoprolol tartrate  50 mg Oral BID  . OxyCODONE  15 mg Oral Q12H  . piperacillin-tazobactam (ZOSYN)  IV  3.375 g Intravenous Q8H  . sodium chloride  3 mL Intravenous Q12H  . vancomycin  750 mg Intravenous Q12H   Continuous Infusions: . sodium chloride 50 mL/hr at 07/07/14 1110  . heparin      Principal Problem:   Metastatic cancer Active Problems:   Rheumatoid arthritis   Hypertension   Lung nodule   Pain of right lower extremity-Lower Back and  Legs   Weakness-Bilateral Leg and Right arm > Left arm   Thrombosis of left subclavian vein   Abnormal thyroid exam   Leukocytosis   Dysphagia   SOB (shortness of breath)   Thyroid enlargement   Poor dentition   Epidural mass   Right paraspinous mass   Rib lesion   Dehydration   Anemia   Metastasis   Pleural effusion   Hyperthyroidism   Neck mass   SDH (subdural hematoma)   HCAP (healthcare-associated pneumonia)    Time spent: 56 minutes    Claryce Friel M.D. [facility] Pager [phone]. If 7PM-7AM, please contact  night-coverage at [email or web address], password  [email or web address] 07/08/2014, 9:01 AM  LOS: 4 days     Addendum:  Patient failed MBS. SLP recommended NPO with other alternative means of nutrition. Spoke with patient, who refused alternative means of nutrition, and accepts the risks of aspiration. Will place patient on soft diet with aspiration precautions. Monitor.

## 2014-07-08 NOTE — Progress Notes (Signed)
Physical Therapy Treatment Patient Details Name: [name] [name] MRN: [id number] DOB: 25-Jul-1940 Today's Date: 07/08/2014    History of Present Illness 74 yo male admitted 07/04/14 with CT scan and MRI of the L-spine showing multiple abnormalities consistent with metastatic malignancy. CT abdomen and pelvis showing pleural effusion right greater than left. Patient complaining of hip pain. Patient had MRI done which showed left subdural hematomas. positive for thrombosis L subclavian vein.. Patient s/p biopsy yesterday off lymph nodes results are pending    PT Comments    Patient expresses top therapists that he wants his cancer treated, then think about other things such as therpay. Patient did participate in sitting at edge and standing, R knee must be supported or it will buckle. Will  utilze a knee immobilizer next visit. Need to order from ortho tech.   Follow Up Recommendations  SNF     Equipment Recommendations  None recommended by PT    Recommendations for Other Services       Precautions / Restrictions Precautions Precautions: Fall Precaution Comments: profound R leg weakness, will buckle, will get KI for next visist Required Braces or Orthoses:  (will request KI for standing)    Mobility  Bed Mobility Overal bed mobility: Needs Assistance;+2 for physical assistance;+ 2 for safety/equipment Bed Mobility: Supine to Sit;Sit to Supine     Supine to sit: Mod assist;+2 for physical assistance;+2 for safety/equipment Sit to supine: Mod assist;+2 for physical assistance;+2 for safety/equipment   General bed mobility comments: Assist to manage BIl LEs and trunk  Transfers Overall transfer level: Needs assistance Equipment used: Rolling walker (2 wheeled) Transfers: Sit to/from Stand Sit to Stand: Max assist;+2 physical assistance;+2 safety/equipment         General transfer comment: manual support of R knee to prevent buckling, buckled x 1 when PT lightened support at the  R  knee.  Ambulation/Gait                 Stairs            Wheelchair Mobility    Modified Rankin (Stroke Patients Only)       Balance   Sitting-balance support: Feet supported;Bilateral upper extremity supported Sitting balance-Leahy Scale: Fair                              Cognition Arousal/Alertness: Awake/alert                          Exercises      General Comments        Pertinent Vitals/Pain Pain Score: 7  Pain Location: R hip and rib cage Pain Descriptors / Indicators: Aching;Constant;Discomfort Pain Intervention(s): Limited activity within patient's tolerance;Monitored during session;Premedicated before session;Repositioned    Home Living                      Prior Function            PT Goals (current goals can now be found in the care plan section) Progress towards PT goals: Progressing toward goals    Frequency  Min 3X/week    PT Plan Current plan remains appropriate    Co-evaluation PT/OT/SLP Co-Evaluation/Treatment: Yes Reason for Co-Treatment: Complexity of the patient's impairments (multi-system involvement);For patient/therapist safety PT goals addressed during session: Mobility/safety with mobility       End of Session Equipment Utilized During Treatment: Gait belt  Activity Tolerance: Patient tolerated treatment well;Patient limited by fatigue Patient left: in bed;with call bell/phone within reach     Time: 1440-1507 PT Time Calculation (min) (ACUTE ONLY): 27 min  Charges:  $Therapeutic Activity: 8-22 mins                    G Codes:      Claretha Cooper 07/08/2014, 3:24 PM [place] [facility] [phone]

## 2014-07-08 NOTE — Progress Notes (Signed)
Nutrition Follow-up  DOCUMENTATION CODES:  Not applicable  INTERVENTION: - If TF considered, recommend initiation of Osmolite 1.2 @ 20 ml/hr and increase by 10 ml every 4 hours to goal rate of 65 ml/hr.   Tube feeding regimen provides 1872 kcal (100% of needs), 86 grams of protein, and 1279 ml of H2O.   - RD will continue to monitor for TF vs diet advancement. Will provide nutritional supplements if diet advanced.   NUTRITION DIAGNOSIS:  Inadequate oral intake related to inability to eat as evidenced by NPO status.  Ongoing  GOAL:  Patient will meet greater than or equal to 90% of their needs  Not met  MONITOR:  Diet advancement, Weight trends, Labs, need for TF  REASON FOR ASSESSMENT:  Consult Assessment of nutrition requirement/status  ASSESSMENT: 74 y.o. patient with metastatic cancer with complaints of a 3 month history of chest pain, bilateral rib pain left greater than right, weakness, neck swelling which has worsened over the past 3 weeks to the point where he sometimes has difficulty breathing and problems swallowing. Patient also with a six-month period of a 10 pound weight loss. Patient does endorse decreased oral intake and decreased appetite over the past month. Patient states recently he's been able to eat is a piece of bread and a cup of milk daily.  -Pt with an 8 lb wt loss since November 2015. (5%- not significant for time frame) -Per RN, pt ate breakfast this morning, but went down for SLP evaluation and did not receive lunch - Pt failed SLP evaluation and is currently NPO. SLP recommends nutrition via alternative means.  - Pt reports that he does not want a "hole in his stomach."  - RD will monitor for diet advancement vs tube feeding initiation.  Height:  Ht Readings from Last 1 Encounters:  07/04/14 _0  (1.702 m)    Weight:  Wt Readings from Last 1 Encounters:  07/08/14 139 lb 5.3 oz (63.2 kg)    Ideal Body Weight:  67.3 kg  Wt Readings  from Last 10 Encounters:  07/08/14 139 lb 5.3 oz (63.2 kg)  01/14/14 147 lb 3.2 oz (66.769 kg)  12/11/13 147 lb 4.3 oz (66.8 kg)  12/05/13 148 lb 1.6 oz (67.178 kg)  11/29/13 149 lb (67.586 kg)  11/19/13 146 lb 11.2 oz (66.543 kg)  11/12/13 145 lb (65.772 kg)  08/25/11 140 lb (63.504 kg)  02/09/10 143 lb (64.864 kg)  02/19/09 148 lb (67.132 kg)    BMI:  Body mass index is 21.82 kg/(m^2).  Estimated Nutritional Needs:  Kcal:  1700-1900  Protein:  85-95 g  Fluid:  1.7-1.9 L/day  Skin:  Reviewed, no issues  Diet Order:  Diet NPO time specified Except for: Ice Chips  EDUCATION NEEDS:  Education needs addressed   Intake/Output Summary (Last 24 hours) at 07/08/14 1416 Last data filed at 07/08/14 0100  Gross per 24 hour  Intake    600 ml  Output    450 ml  Net    150 ml    Last BM:  Prior to admission  Laurette Schimke [place], [place], [place]

## 2014-07-08 NOTE — Progress Notes (Signed)
ANTIBIOTIC CONSULT NOTE - INITIAL  Pharmacy Consult for vancomycin Indication: pneumonia  Allergies  Allergen Reactions  . Other     Catfish + IV pain medication (unknown name)-vomits    Patient Measurements: Height: '5\' 7"'$  (170.2 cm) Weight: 139 lb 5.3 oz (63.2 kg) IBW/kg (Calculated) : 66.1   Vital Signs: Temp: 98.1 F (36.7 C) (05/10 0500) Temp Source: Oral (05/10 0500) BP: 164/67 mmHg (05/10 0600) Pulse Rate: 88 (05/10 0600) Intake/Output from previous day: 05/09 0701 - 05/10 0700 In: 975 [I.V.:925; IV Piggyback:50] Out: 700 [Urine:700] Intake/Output from this shift:    Labs:  Recent Labs  07/06/14 0405 07/07/14 0401 07/08/14 0400  WBC 26.0* 20.9* 23.2*  HGB 9.6* 8.8* 9.0*  PLT 182 172 167  CREATININE 0.47* 0.49* 0.50*   Estimated Creatinine Clearance: 75.7 mL/min (by C-G formula based on Cr of 0.5). No results for input(s): VANCOTROUGH, VANCOPEAK, VANCORANDOM, GENTTROUGH, GENTPEAK, GENTRANDOM, TOBRATROUGH, TOBRAPEAK, TOBRARND, AMIKACINPEAK, AMIKACINTROU, AMIKACIN in the last 72 hours.   Microbiology: Recent Results (from the past 720 hour(s))  Culture, blood (routine x 2)     Status: None (Preliminary result)   Collection Time: 07/04/14  1:54 PM  Result Value Ref Range Status   Specimen Description BLOOD LEFT ANTECUBITAL  Final   Special Requests BOTTLES DRAWN AEROBIC AND ANAEROBIC 5 CC EA  Final   Culture   Final           BLOOD CULTURE RECEIVED NO GROWTH TO DATE CULTURE WILL BE HELD FOR 5 DAYS BEFORE ISSUING A FINAL NEGATIVE REPORT Performed at [facility]    Report Status PENDING  Incomplete  Culture, blood (routine x 2)     Status: None (Preliminary result)   Collection Time: 07/04/14  1:55 PM  Result Value Ref Range Status   Specimen Description BLOOD BLOOD RIGHT FOREARM  Final   Special Requests BOTTLES DRAWN AEROBIC AND ANAEROBIC 5 CC EA  Final   Culture   Final           BLOOD CULTURE RECEIVED NO GROWTH TO DATE CULTURE WILL BE HELD  FOR 5 DAYS BEFORE ISSUING A FINAL NEGATIVE REPORT Performed at [facility]    Report Status PENDING  Incomplete  Culture, Urine     Status: None   Collection Time: 07/04/14  1:59 PM  Result Value Ref Range Status   Specimen Description URINE, RANDOM  Final   Special Requests NONE  Final   Colony Count NO GROWTH Performed at [facility]   Final   Culture NO GROWTH Performed at [facility]   Final   Report Status 07/05/2014 FINAL  Final  MRSA PCR Screening     Status: None   Collection Time: 07/04/14  2:42 PM  Result Value Ref Range Status   MRSA by PCR NEGATIVE NEGATIVE Final    Comment:        The GeneXpert MRSA Assay (FDA approved for NASAL specimens only), is one component of a comprehensive MRSA colonization surveillance program. It is not intended to diagnose MRSA infection nor to guide or monitor treatment for MRSA infections.     Medical History: Past Medical History  Diagnosis Date  . Lung nodule 2012  . Insomnia     takes Restoril nightly as needed  . Hypertension     takes Benazepril and Metoprolol daily  . Pneumonia     hx of;many yrs ago  . AAA (abdominal aortic aneurysm)   . Joint pain   .  Joint swelling   . Urinary frequency     takes Terazosin daily  . Urinary urgency   . Nocturia   . Enlarged prostate     slightly  . 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  CXR findings on 5/9 were consistent with PNA.  Zosyn initiated this morning.  To start add vancomycin to regimen for broad coverage. - afeb, wbc elevated 23.2, scr stable at 0.50 (crcl~76)  Goal of Therapy:  Vancomycin trough level 15-20 mcg/ml  Plan:  - vancomycin '750mg'$  IV q12h - f/u cultures, renal function, and clinical status  Karri Kallenbach P 07/08/2014,8:00 AM

## 2014-07-08 NOTE — Clinical Social Work Note (Signed)
Clinical Social Work Assessment  Patient Details  Name: [name] [name] MRN: [id number] Date of Birth: 01/26/1941  Date of referral:  07/08/14               Reason for consult:  Discharge Planning, Emotional/Coping/Adjustment to Illness                Permission sought to share information with:  Family Supports Permission granted to share information::  Yes, Verbal Permission Granted  Name::     Yun Cullipher/son  Agency::  [place] [facility] search  Relationship::  son  Contact Information:  [phone]  Housing/Transportation Living arrangements for the past 2 months:  Single Family Home Source of Information:  Patient Patient Interpreter Needed:  None Criminal Activity/Legal Involvement Pertinent to Current Situation/Hospitalization:  No - Comment as needed Significant Relationships:  Adult Children, Spouse Lives with:  Spouse Do you feel safe going back to the place where you live?  No Need for family participation in patient care:  Yes (Comment) (family support needed from pt son as pt with new diagnosis and will need SNF once ready for d/c.  Pt spouse is currently on vacation in [place].)  Care giving concerns:  Pt from home with spouse, but pt spouse is currently on vacation in [place] until the end of June. Pt has adult son that lives locally, but works full time, married, and has a two year old. Pt unable to manage his own care at home once medically ready for discharge.   Social Worker assessment / plan:  CSW received notification from attending MD that pt not yet ready for d/c, but pt will need SNF following hospitalization. Pt with a new diagnosis of metastatic disease and awaiting pathology results to determine primary source and treatment plan per MD.   CSW met with pt alone at bedside. CSW introduced self and explained role. CSW provided emotional support as pt discussed his feelings surrounding new diagnosis and coping with all of the tests he has been receiving. Pt expressed that  "it is a lot", but recognizes the need for the test in order to determine a treatment plan. Pt shared that pt wife is currently in [place] on vacation until the end of June. Pt stated that pt wife had left for vacation before he came to the hospital and learned of his diagnosis. Pt became tearful as he discussed that he has not been able to speak to her since she has left for vacation and shared his feelings surrounding how he knows that his wife would not have gone on vacation if he would have learned of diagnosis before she left. Pt discussed that he has an adult son that lives locally and who is supportive, but has a full time job, married, and has a 72 year old which keeps pt son busy.   CSW discussed with pt that pt not near ready for d/c yet, but recommendation is for SNF upon discharge in order to get continued care following hospitalization. CSW discussed that it was important to begin process of SNF search in order for pt to make a decision and insurance authorization to be initiated as pt insurance requires an authorization process. Pt expressed understanding and agreeable to [place] [facility] search. Pt expressed concerns surrounding insurance coverage for hospital and SNF. CSW explained that CSW cannot answer specific questions about insurance coverage for hospital, but once CSW does SNF search and pt chooses a facility then that facility can give us specific information   about how pt insurance covers SNF stay. Pt confirmed that primary insurance is BCBS as he was still working full time prior to hospitalization.   CSW provided supportive counseling surrounding adjustment to illness throughout assessment. CSW provided CSW contact information at pt bedside in order for pt son to have contact information if needed. Pt reports that pt son will visit today.  CSW completed FL2 and initiated SNF search to [place].   CSW to follow up with pt re: SNF bed offers in order to get decision and initiate  insurance authorization process as this process can take time.   CSW to continue to follow to provide support and assist with pt disposition needs.   Employment status:  Full-Time Insurance information:  Managed Medicare PT Recommendations:  Skilled Nursing Facility, Home with Home Health, 24 Hour Supervision Information / Referral to community resources:  Skilled Nursing Facility  Patient/Family's Response to care:  Pt alert and oriented x 4. Pt RN reported that pt has been anxious this morning, but this has improved. Pt pleasant during conversation and CSW was able to clarify questions and concerns that pt had regarding insurance and SNF. Pt recognizes need for care at SNF following hospitalization and need to be proactive regarding SNF even though pt not yet medically ready for d/c.   Patient/Family's Understanding of and Emotional Response to Diagnosis, Current Treatment, and Prognosis:  Pt coping as well as can be anticipated with new diagnosis of metastatic disease. Pt expressed feeling overwhelmed with the amount of test and procedures he has received and anticipation for continued test and procedures. Pt expressed strong support from pt wife, but pt wife is not yet aware of pt diagnosis as she is currently in [place] and unable to be a support to pt at this time.   Emotional Assessment Appearance:  Appears stated age Attitude/Demeanor/Rapport:  Crying (pt tearful when discussing pt wife being in [place] at this time) Affect (typically observed):  Anxious, Tearful/Crying Orientation:  Oriented to Self, Oriented to Place, Oriented to  Time, Oriented to Situation Alcohol / Substance use:  Not Applicable Psych involvement (Current and /or in the community):  No (Comment)  Discharge Needs  Concerns to be addressed:  Discharge Planning Concerns, Coping/Stress Concerns Readmission within the last 30 days:  No Current discharge risk:  Physical Impairment Barriers to Discharge:  Continued Medical  Work up   Allan Bacigalupi A, LCSW 07/08/2014, 11:41 AM [phone] 

## 2014-07-08 NOTE — Progress Notes (Signed)
Patient spoke with both Dr. Grandville Silos and SLP regarding nutrition and aspiration risks. Patient wishes to eat despite being made aware of the risk of aspiration. Maintaining aspiration precautions.

## 2014-07-08 NOTE — Progress Notes (Signed)
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 Other     Catfish + IV pain medication (unknown name)-vomits    Patient Measurements: Height: '5\' 7"'$  (170.2 cm) Weight: 139 lb 5.3 oz (63.2 kg) IBW/kg (Calculated) : 66.1 Heparin Dosing Weight: 63 kg  Vital Signs: Temp: 97.8 F (36.6 C) (05/10 0800) Temp Source: Axillary (05/10 0800) BP: 164/67 mmHg (05/10 0600) Pulse Rate: 88 (05/10 0600)  Labs:  Recent Labs  07/06/14 0405 07/07/14 0401 07/08/14 0400  HGB 9.6* 8.8* 9.0*  HCT 29.3* 26.3* 27.4*  PLT 182 172 167  LABPROT  --  18.2*  --   INR  --  1.49  --   CREATININE 0.47* 0.49* 0.50*    Estimated Creatinine Clearance: 75.7 mL/min (by C-G formula based on Cr of 0.5).   Medical History: Past Medical History  Diagnosis Date  . Lung nodule 2012  . Insomnia     takes Restoril nightly as needed  . Hypertension     takes Benazepril and Metoprolol daily  . Pneumonia     hx of;many yrs ago  . AAA (abdominal aortic aneurysm)   . Joint pain   . Joint swelling   . Urinary frequency     takes Terazosin daily  . Urinary urgency   . Nocturia   . Enlarged prostate     slightly  . 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  Full dose lovenox was initiated on 5/6 but was d/ced on 5/8 d/t left side subdural hematoma noted on head MRI. To resume anticoagulation with heparin today for thrombosis per MD's request.  Goal of Therapy:  Heparin level 0.3-0.5 units/ml (will aim for lower end of usual heparin goal range of 0.3-0.7 d/t subdural hematoma) Monitor platelets by anticoagulation protocol: Yes   Plan:  - heparin drip 750 units/hr (no bolus) - check 8 hour heparin level  -  monitor for s/s of bleeding  Syna Gad P 07/08/2014,8:23 AM

## 2014-07-08 NOTE — Progress Notes (Signed)
[name] had his biopsy yesterday. This is a thyroid biopsy. He had 5 specimens taken. Hopefully, there will be a preliminary report out today.  He was seen by radiation therapy. It sounds that they will start treatment on him soon. Possibly, even Wednesday.  He is scheduled for a bone scan.  He had a CT of the brain yesterday. It showed the subdural hematomas. Again I don't know if these are new or old. However, there is some concern from the radiologist perspective about metastatic disease to the brain. As such, I think that he may need to have a spinal tap with spinal fluid sent off for cytology to see if he has seen S involvement.  There is some weakness in dorsiflexion and plantar flexion of his right foot. I suspect this is from the disease in his lumbar spine.  I think interventional radiology should be consulted to see if he will qualify for kyphoplasty or for vertebroplasty at L4 since it is replaced by tumor  He's having some discomfort over on the left lateral chest wall. It might be oh metastases. We'll see what the bone scan shows.  He continues on Decadron. I will keep him on Decadron for right now.  He continues on heparin. This is for the subclavian thrombus. He is hypercoagulable from this underlying malignancy.  He had a swallow study. There was some possible aspiration.  It looks like physical therapy is trying to help out also.  On his labs, his hemoglobin is stable at 9. White White cell count is 23. Platelet count 167K.  His prealbumin is only 15.6.  His LDH is elevated. This might be indicative of lymphoma.  We really have to see what the biopsy shows. Hopefully, there will be a preliminary report out today.  On physical exam, his vital signs all look stable. His blood pressure is 1 6467. Some of this elevation might be from Decadron. He is afebrile. His heart rate is coming down a little bit. I really cannot find any changes on his physical exam. Does have some  more ecchymoses in the right forearm and dorsum of the right hand. He has good range of motion of the left leg. He has decent range of motion strength of the right leg outside of the foot.  This is still a very tough case. There is a lot going on. He still does not even close to being ready for any type of discharge.  There are still several procedures, in my mind, that need to be done.  I appreciate everybody's help. He is so thankful for all the kindness that has been shown him in the ICU!!  [name] [name] 27:4

## 2014-07-08 NOTE — Progress Notes (Signed)
Patient ID: [name] [name], male   DOB: 29-Oct-1940, 74 y.o.   MRN: [id number]   Request for IR procedure per Dr Marin Olp Regarding Lumbar 4 lesion; back pain  Imaging reviewed with Dr Vernard Gambles  No compression/fx Tumor is causing some retropulsion to cord Cannot safely perform any IR procedure at this time  If compression occurs; may be able to revisit VP procedure

## 2014-07-08 NOTE — Progress Notes (Signed)
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 Other     Catfish + IV pain medication (unknown name)-vomits    Patient Measurements: Height: '5\' 7"'$  (170.2 cm) Weight: 139 lb 5.3 oz (63.2 kg) IBW/kg (Calculated) : 66.1 Heparin Dosing Weight: 63 kg  Vital Signs: Temp: 98 F (36.7 C) (05/10 1600) Temp Source: Axillary (05/10 1600) BP: 187/84 mmHg (05/10 1800) Pulse Rate: 87 (05/10 1800)  Labs:  Recent Labs  07/06/14 0405 07/07/14 0401 07/08/14 0400 07/08/14 1720  HGB 9.6* 8.8* 9.0*  --   HCT 29.3* 26.3* 27.4*  --   PLT 182 172 167  --   LABPROT  --  18.2*  --   --   INR  --  1.49  --   --   HEPARINUNFRC  --   --   --  <0.10*  CREATININE 0.47* 0.49* 0.50*  --     Estimated Creatinine Clearance: 75.7 mL/min (by C-G formula based on Cr of 0.5).   Medical History: Past Medical History  Diagnosis Date  . Lung nodule 2012  . Insomnia     takes Restoril nightly as needed  . Hypertension     takes Benazepril and Metoprolol daily  . Pneumonia     hx of;many yrs ago  . AAA (abdominal aortic aneurysm)   . Joint pain   . Joint swelling   . Urinary frequency     takes Terazosin daily  . Urinary urgency   . Nocturia   . Enlarged prostate     slightly  . 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  Full dose lovenox was initiated on 5/6 but was d/ced on 5/8 d/t left side subdural hematoma noted on head MRI. To resume anticoagulation with heparin today for thrombosis per MD's request.   Heparin level = < 0.1 on heparin @ 750 units/hr  No complications of therapy noted  No issues with infusion per RN  Goal of Therapy:  Heparin level 0.3-0.5 units/ml (will aim for lower  end of usual heparin goal range of 0.3-0.7 d/t subdural hematoma) Monitor platelets by anticoagulation protocol: Yes   Plan:  - increase heparin drip to 900 units/hr (no bolus due to hematoma) - check 8 hour heparin level  - monitor for s/s of bleeding  Elijah Phommachanh, Toribio Harbour, PharmD 07/08/2014,7:12 PM

## 2014-07-08 NOTE — Evaluation (Signed)
Occupational Therapy Evaluation Patient Details Name: [name] [name] MRN: [id number] DOB: 09-06-1940 Today's Date: 07/08/2014    History of Present Illness 74 yo male admitted 07/04/14 with CT scan and MRI of the L-spine showing multiple abnormalities consistent with metastatic malignancy. CT abdomen and pelvis showing pleural effusion right greater than left. Patient complaining of hip pain. Patient had MRI done which showed left subdural hematomas. positive for thrombosis L subclavian vein.. Patient s/p thyroid biopsy yesterday off lymph nodes results are pending   Clinical Impression   Pt was admitted for the above.  At baseline, pt was mod I with adls.  Noted decline in standing in past few days based on PT evaluation.  Pt's priority is CA tx.  Will follow in acute as he tolerates, to increase independence with mobility related to ADLs and ADLs.  Goals are for supervision to min A for toileting and seated ADLs.  ADLs assessed from supine with up to total A needed.  Pt can sit EOB but fatiques easily    Follow Up Recommendations  SNF;Supervision/Assistance - 24 hour    Equipment Recommendations   (tba:  possibly drop arm 3:1 commode)    Recommendations for Other Services       Precautions / Restrictions Precautions Precautions: Fall Precaution Comments: profound R leg weakness, will buckle, will get KI for next visit for standing Required Braces or Orthoses:  (will request KI for standing) Restrictions Weight Bearing Restrictions: No      Mobility Bed Mobility Overal bed mobility: Needs Assistance;+2 for physical assistance;+ 2 for safety/equipment Bed Mobility: Supine to Sit;Sit to Supine Rolling: Min assist   Supine to sit: Mod assist;+2 for physical assistance;+2 for safety/equipment Sit to supine: Mod assist;+2 for physical assistance;+2 for safety/equipment   General bed mobility comments: Assist to manage BIl LEs and trunk  Transfers Overall transfer level: Needs  assistance Equipment used: Rolling walker (2 wheeled) Transfers: Sit to/from Stand Sit to Stand: Max assist;+2 physical assistance;+2 safety/equipment         General transfer comment: manual support of R knee to prevent buckling, buckled x 1 when PT lightened support at the  R knee.    Balance   Sitting-balance support: Feet supported;Bilateral upper extremity supported Sitting balance-Leahy Scale: Fair                                      ADL Overall ADL's : Needs assistance/impaired     Grooming: Set up;Bed level   Upper Body Bathing: Set up   Lower Body Bathing: Moderate assistance;Bed level   Upper Body Dressing : Minimal assistance;Bed level (lines)   Lower Body Dressing: Total assistance;Bed level                 General ADL Comments: pt sat EOB and stood with mod A x 2.  This was very energy consuming for him.  He was aware that he was unable to stand with nursing and willing to try.  ADLs assessed from supine, but he could perform UB from EOB with min guard for safety.  Pt was able to bend RLE up in bed but unable to bear weight--buckles     Vision     Perception     Praxis      Pertinent Vitals/Pain Pain Score: 7  Pain Location: R hip and ribcage Pain Descriptors / Indicators: Aching Pain Intervention(s): Limited activity within patient's  tolerance;Monitored during session;Repositioned;Premedicated before session     Hand Dominance     Extremity/Trunk Assessment Upper Extremity Assessment Upper Extremity Assessment: Overall WFL for tasks assessed (did not MMT, L4 tumor)           Communication Communication Communication: No difficulties (English is not primary language)   Cognition Arousal/Alertness: Awake/alert Behavior During Therapy: WFL for tasks assessed/performed                   General Comments: pt has had a lot to deal with.  His priority is cancer tx.  Feels he needs to concentrate on this, not feeding  tube, other things.  Explained that he can work with therapy while he is undergoing tx to keep up strength as he tolerates this   General Comments       Exercises       Shoulder Instructions      Home Living Family/patient expects to be discharged to:: Unsure                                 Additional Comments: per chart, lives with son and daughter in law, but alone during the day      Prior Functioning/Environment Level of Independence: Independent with assistive device(s)             OT Diagnosis: Generalized weakness;Acute pain   OT Problem List: Decreased strength;Decreased activity tolerance;Impaired balance (sitting and/or standing);Decreased knowledge of use of DME or AE;Pain   OT Treatment/Interventions: Self-care/ADL training;DME and/or AE instruction;Patient/family education;Balance training;Therapeutic activities    OT Goals(Current goals can be found in the care plan section) Acute Rehab OT Goals Patient Stated Goal: get tx for CA so he will survive OT Goal Formulation: With patient Time For Goal Achievement: 07/22/14 Potential to Achieve Goals: Fair ADL Goals Pt Will Transfer to Toilet: with min assist;squat pivot transfer;bedside commode;with transfer board (vs, drop arm) Pt Will Perform Toileting - Clothing Manipulation and hygiene: with min assist;sitting/lateral leans Additional ADL Goal #1: pt will tolerate unsupported sitting at EOB for 15 minutes while performing UB adls at set up level  OT Frequency: Min 2X/week   Barriers to D/C:            Co-evaluation PT/OT/SLP Co-Evaluation/Treatment: Yes Reason for Co-Treatment: Complexity of the patient's impairments (multi-system involvement) PT goals addressed during session: Mobility/safety with mobility OT goals addressed during session: ADL's and self-care      End of Session    Activity Tolerance: Patient limited by fatigue Patient left: in bed;with call bell/phone within reach    Time: 2330-0762 OT Time Calculation (min): 26 min Charges:  OT General Charges $OT Visit: 1 Procedure OT Evaluation $Initial OT Evaluation Tier I: 1 Procedure G-Codes:    Dominyck Reser 07/11/14, 3:43 PM   Lesle Chris, OTR/L [phone] Jul 11, 2014

## 2014-07-09 ENCOUNTER — Inpatient Hospital Stay (HOSPITAL_COMMUNITY): Payer: BC Managed Care – PPO

## 2014-07-09 ENCOUNTER — Ambulatory Visit
Admit: 2014-07-09 | Discharge: 2014-07-09 | Disposition: A | Discharge status: Home / Self Care | Payer: BC Managed Care – PPO | Attending: Radiation Oncology | Admitting: Radiation Oncology

## 2014-07-09 ENCOUNTER — Encounter: Payer: Self-pay | Admitting: *Deleted

## 2014-07-09 DIAGNOSIS — C7951 Secondary malignant neoplasm of bone: Secondary | ICD-10-CM | POA: Insufficient documentation

## 2014-07-09 DIAGNOSIS — R221 Localized swelling, mass and lump, neck: Secondary | ICD-10-CM | POA: Insufficient documentation

## 2014-07-09 DIAGNOSIS — D649 Anemia, unspecified: Secondary | ICD-10-CM

## 2014-07-09 DIAGNOSIS — Z510 Encounter for antineoplastic radiation therapy: Secondary | ICD-10-CM | POA: Insufficient documentation

## 2014-07-09 DIAGNOSIS — Z87891 Personal history of nicotine dependence: Secondary | ICD-10-CM

## 2014-07-09 DIAGNOSIS — C801 Malignant (primary) neoplasm, unspecified: Secondary | ICD-10-CM | POA: Insufficient documentation

## 2014-07-09 DIAGNOSIS — I82B12 Acute embolism and thrombosis of left subclavian vein: Secondary | ICD-10-CM

## 2014-07-09 LAB — CBC
HEMATOCRIT: 28.3 % — AB (ref 39.0–52.0)
Hemoglobin: 9.2 g/dL — ABNORMAL LOW (ref 13.0–17.0)
MCH: 30.8 pg (ref 26.0–34.0)
MCHC: 32.5 g/dL (ref 30.0–36.0)
MCV: 94.6 fL (ref 78.0–100.0)
Platelets: 179 10*3/uL (ref 150–400)
RBC: 2.99 MIL/uL — ABNORMAL LOW (ref 4.22–5.81)
RDW: 14.3 % (ref 11.5–15.5)
WBC: 22.9 10*3/uL — AB (ref 4.0–10.5)

## 2014-07-09 LAB — LEGIONELLA ANTIGEN, URINE

## 2014-07-09 LAB — HEPARIN LEVEL (UNFRACTIONATED)
HEPARIN UNFRACTIONATED: 0.27 [IU]/mL — AB (ref 0.30–0.70)
Heparin Unfractionated: 0.11 IU/mL — ABNORMAL LOW (ref 0.30–0.70)
Heparin Unfractionated: 0.17 IU/mL — ABNORMAL LOW (ref 0.30–0.70)

## 2014-07-09 LAB — COMPREHENSIVE METABOLIC PANEL
ALT: 51 U/L (ref 17–63)
ANION GAP: 10 (ref 5–15)
AST: 37 U/L (ref 15–41)
Albumin: 2.7 g/dL — ABNORMAL LOW (ref 3.5–5.0)
Alkaline Phosphatase: 59 U/L (ref 38–126)
BILIRUBIN TOTAL: 1.1 mg/dL (ref 0.3–1.2)
BUN: 35 mg/dL — AB (ref 6–20)
CHLORIDE: 107 mmol/L (ref 101–111)
CO2: 20 mmol/L — ABNORMAL LOW (ref 22–32)
Calcium: 7.8 mg/dL — ABNORMAL LOW (ref 8.9–10.3)
Creatinine, Ser: 0.51 mg/dL — ABNORMAL LOW (ref 0.61–1.24)
GFR calc Af Amer: >60 mL/min (ref >60)
GFR calc non Af Amer: >60 mL/min (ref >60)
GLUCOSE: 114 mg/dL — AB (ref 70–99)
POTASSIUM: 4.4 mmol/L (ref 3.5–5.1)
Sodium: 137 mmol/L (ref 135–145)
Total Protein: 5.3 g/dL — ABNORMAL LOW (ref 6.5–8.1)

## 2014-07-09 LAB — GLUCOSE, CAPILLARY: Glucose-Capillary: 100 mg/dL — ABNORMAL HIGH (ref 70–99)

## 2014-07-09 LAB — MAGNESIUM: Magnesium: 2.5 mg/dL — ABNORMAL HIGH (ref 1.7–2.4)

## 2014-07-09 LAB — CALCITONIN: Calcitonin: <2 pg/mL (ref 0.0–8.4)

## 2014-07-09 MED ORDER — VANCOMYCIN HCL IN DEXTROSE 1-5 GM/200ML-% IV SOLN
INTRAVENOUS | Status: AC
  Filled 2014-07-09: qty 200

## 2014-07-09 MED ORDER — PANTOPRAZOLE SODIUM 40 MG PO TBEC
40.0000 mg | DELAYED_RELEASE_TABLET | Freq: Two times a day (BID) | ORAL | Status: DC
  Administered 2014-07-09 – 2014-07-18 (×19): 40 mg via ORAL
  Filled 2014-07-09 (×23): qty 1

## 2014-07-09 MED ORDER — ALPRAZOLAM 0.5 MG PO TABS
0.5000 mg | ORAL_TABLET | Freq: Four times a day (QID) | ORAL | Status: DC | PRN
  Administered 2014-07-10 – 2014-07-18 (×15): 0.5 mg via ORAL
  Filled 2014-07-09 (×10): qty 1
  Filled 2014-07-09: qty 2
  Filled 2014-07-09 (×2): qty 1
  Filled 2014-07-09: qty 2
  Filled 2014-07-09: qty 1

## 2014-07-09 MED ORDER — SODIUM CHLORIDE 0.9 % IJ SOLN
10.0000 mL | INTRAMUSCULAR | Status: DC | PRN
  Administered 2014-07-11 – 2014-07-12 (×2): 10 mL
  Administered 2014-07-13: 30 mL
  Administered 2014-07-14: 20 mL
  Administered 2014-07-15: 10 mL
  Administered 2014-07-16: 20 mL
  Filled 2014-07-09 (×6): qty 40

## 2014-07-09 MED ORDER — BOOST / RESOURCE BREEZE PO LIQD
1.0000 | Freq: Two times a day (BID) | ORAL | Status: DC
  Administered 2014-07-09 – 2014-07-17 (×9): 1 via ORAL

## 2014-07-09 MED ORDER — SODIUM CHLORIDE 0.9 % IJ SOLN
10.0000 mL | Freq: Two times a day (BID) | INTRAMUSCULAR | Status: DC
  Administered 2014-07-09: 30 mL
  Administered 2014-07-09 – 2014-07-16 (×8): 10 mL

## 2014-07-09 MED ORDER — HEPARIN (PORCINE) IN NACL 100-0.45 UNIT/ML-% IJ SOLN
1200.0000 [IU]/h | INTRAMUSCULAR | Status: DC
  Administered 2014-07-10: 1200 [IU]/h via INTRAVENOUS
  Filled 2014-07-09 (×2): qty 250

## 2014-07-09 MED ORDER — DEXAMETHASONE 4 MG PO TABS
8.0000 mg | ORAL_TABLET | Freq: Two times a day (BID) | ORAL | Status: DC
  Administered 2014-07-09 – 2014-07-18 (×19): 8 mg via ORAL
  Filled 2014-07-09: qty 2
  Filled 2014-07-09: qty 4
  Filled 2014-07-09 (×6): qty 2
  Filled 2014-07-09: qty 4
  Filled 2014-07-09 (×7): qty 2
  Filled 2014-07-09: qty 4
  Filled 2014-07-09 (×3): qty 2
  Filled 2014-07-09: qty 4

## 2014-07-09 MED ORDER — ENSURE ENLIVE PO LIQD
237.0000 mL | Freq: Two times a day (BID) | ORAL | Status: DC
  Administered 2014-07-09 – 2014-07-11 (×2): 237 mL via ORAL

## 2014-07-09 MED ORDER — HEPARIN (PORCINE) IN NACL 100-0.45 UNIT/ML-% IJ SOLN
1150.0000 [IU]/h | INTRAMUSCULAR | Status: DC
  Administered 2014-07-09: 1050 [IU]/h via INTRAVENOUS
  Filled 2014-07-09 (×3): qty 250

## 2014-07-09 NOTE — Progress Notes (Signed)
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 Other     Catfish + IV pain medication (unknown name)-vomits    Patient Measurements: Height: '5\' 7"'$  (170.2 cm) Weight: 139 lb 5.3 oz (63.2 kg) IBW/kg (Calculated) : 66.1 Heparin Dosing Weight: 63 kg  Vital Signs: Temp: 97.5 F (36.4 C) (05/11 0400) Temp Source: Oral (05/11 0400) BP: 166/76 mmHg (05/11 0200) Pulse Rate: 73 (05/11 0200)  Labs:  Recent Labs  07/07/14 0401 07/08/14 0400 07/08/14 1720 07/09/14 0340  HGB 8.8* 9.0*  --  9.2*  HCT 26.3* 27.4*  --  28.3*  PLT 172 167  --  179  LABPROT 18.2*  --   --   --   INR 1.49  --   --   --   HEPARINUNFRC  --   --  <0.10* 0.11*  CREATININE 0.49* 0.50*  --  0.51*    Estimated Creatinine Clearance: 75.7 mL/min (by C-G formula based on Cr of 0.51).   Medical History: Past Medical History  Diagnosis Date  . Lung nodule 2012  . Insomnia     takes Restoril nightly as needed  . Hypertension     takes Benazepril and Metoprolol daily  . Pneumonia     hx of;many yrs ago  . AAA (abdominal aortic aneurysm)   . Joint pain   . Joint swelling   . Urinary frequency     takes Terazosin daily  . Urinary urgency   . Nocturia   . Enlarged prostate     slightly  . 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  Full dose lovenox was initiated on 5/6 but was d/ced on 5/8 d/t left side subdural hematoma noted on head MRI. To resume anticoagulation with heparin today for thrombosis per MD's request.  07/08/14  Heparin level = < 0.1 on heparin @ 750 units/hr, no problems  Today, 07/09/14  0340 HL=0.11, no problems per RN  Goal of Therapy:  Heparin level 0.3-0.5 units/ml (will aim for  lower end of usual heparin goal range of 0.3-0.7 d/t subdural hematoma) Monitor platelets by anticoagulation protocol: Yes   Plan:  - increase heparin drip to 1050 units/hr (no bolus due to hematoma) - check 8 hour heparin level  - monitor for s/s of bleeding  Dorrene German, PharmD 07/09/2014,5:22 AM

## 2014-07-09 NOTE — Progress Notes (Signed)
Peripherally Inserted Central Catheter/Midline Placement  The IV Nurse has discussed with the patient and/or persons authorized to consent for the patient, the purpose of this procedure and the potential benefits and risks involved with this procedure.  The benefits include less needle sticks, lab draws from the catheter and patient may be discharged home with the catheter.  Risks include, but not limited to, infection, bleeding, blood clot (thrombus formation), and puncture of an artery; nerve damage and irregular heat beat.  Alternatives to this procedure were also discussed.  PICC/Midline Placement Documentation  PICC Triple Lumen 64/84/72 PICC Right Basilic 39 cm 1 cm (Active)  Indication for Insertion or Continuance of Line Limited venous access - need for IV therapy >5 days (PICC only) 07/09/2014  8:00 AM  Exposed Catheter (cm) 1 cm 07/09/2014  8:00 AM  Dressing Change Due 07/16/14 07/09/2014  8:00 AM       Jule Economy Horton 07/09/2014, 8:51 AM

## 2014-07-09 NOTE — Progress Notes (Signed)
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 Other     Catfish + IV pain medication (unknown name)-vomits    Patient Measurements: Height: '5\' 7"'$  (170.2 cm) Weight: 139 lb 5.3 oz (63.2 kg) IBW/kg (Calculated) : 66.1 Heparin Dosing Weight: 63 kg  Vital Signs: Temp: 97.3 F (36.3 C) (05/11 1200) Temp Source: Axillary (05/11 1200) BP: 180/89 mmHg (05/11 1400) Pulse Rate: 86 (05/11 1400)  Labs:  Recent Labs  07/07/14 0401 07/08/14 0400 07/08/14 1720 07/09/14 0340 07/09/14 1330  HGB 8.8* 9.0*  --  9.2*  --   HCT 26.3* 27.4*  --  28.3*  --   PLT 172 167  --  179  --   LABPROT 18.2*  --   --   --   --   INR 1.49  --   --   --   --   HEPARINUNFRC  --   --  <0.10* 0.11* 0.17*  CREATININE 0.49* 0.50*  --  0.51*  --     Estimated Creatinine Clearance: 75.7 mL/min (by C-G formula based on Cr of 0.51).   Medical History: Past Medical History  Diagnosis Date  . Lung nodule 2012  . Insomnia     takes Restoril nightly as needed  . Hypertension     takes Benazepril and Metoprolol daily  . Pneumonia     hx of;many yrs ago  . AAA (abdominal aortic aneurysm)   . Joint pain   . Joint swelling   . Urinary frequency     takes Terazosin daily  . Urinary urgency   . Nocturia   . Enlarged prostate     slightly  . 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  Full dose lovenox was initiated on 5/6 but was d/ced on 5/8 d/t left side subdural hematoma noted on head MRI. To resume anticoagulation with heparin today for thrombosis per MD's request.  Significant events: - neck lymph node biopsy on 5/9 - PICC placement and radiation treatment on 5/11  Today, 07/09/14 (PM) - heparin level  remains sub-therapeutic but increased to 0.17 after rate increased to 1050 units/hr this morning - cbc is stable. No bleeding documented   Goal of Therapy:  Heparin level 0.3-0.5 units/ml (will aim for lower end of usual heparin goal range of 0.3-0.7 d/t subdural hematoma) Monitor platelets by anticoagulation protocol: Yes   Plan:  - Increase heparin drip to 1150 units/hr (no bolus) - check 8 hour heparin level  - monitor for s/s of bleeding  [name] [name] P 07/09/2014,2:57 PM

## 2014-07-09 NOTE — Progress Notes (Signed)
Received a call from [facility] pharmacist wanting to speak with the nurse who cared for this patient during his radiation treatment.  She wanted to clarify if all of this patients Vancomycin had been administered.  Notified Joaquim Lai RN who cared for this patient.

## 2014-07-09 NOTE — Progress Notes (Signed)
Spoke with Hoyle Sauer, RN. Explained that when this RN received the patient in CT/Simulation around 1110 the piggyback of vancomycin was complete and the primary bag was infusing.

## 2014-07-09 NOTE — Progress Notes (Signed)
[name]:[id number] DOB: [birth year] DOA: 07/04/2014 PCP: [name], MD  HPI/Recap of past 35 hours: 74 year old male with past medical history of rheumatoid arthritis and hypertension had been having 3 month history of bilateral chest pain as well as 3 weeks of neck swelling to the point it was affecting his breathing and swallowing. Patient also reported a one-week history of hemoptysis. Seen by his PCP and underwent a outpatient CT scan noted massive thyroid gland enlargement with adjacent phlegmon left greater than right and necrotic cervical lymphadenopathy compatible with metastatic disease versus acute suppurative thyroiditis. Patient also noted to have small bilateral pleural effusions, a thrombus of the junction of left subclavian vein/left brachiocephalic faint. CT scan of chest also noted ground glass opacities and haziness and lung lobes concerning for acute versus chronic, infectious versus inflammatory and lytic lesions seen in spine and ribs. As per PCP, patient immediately sent over to the emergency room for admission on 5/6 and further workup.  Seen by oncology, ENT and endocrinology. Primary thyroid malignancy felt to be somewhat unusual and lymphoma possible consideration. Patient underwent MRI which found incidentally subdural hematoma. This limited patient's ability to be a medical regulation first thrombus, however after discussion with neurosurgery and oncology, patient has been started on low-dose anticoagulation and monitored closely. Also started on IV steroids. He underwent biopsy with tissue pending.  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 pain somewhat controlled. On IV steroids.  Active Problems:   Rheumatoid arthritis: Plaquenil and methotrexate on hold.    Hypertension: Continue ACE inhibitor and beta blocker.    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 Initially recommended discontinuing Lovenox. However in speaking with oncology, patient developed cellulitis have for Izora Gala is a high clot risk given thrombus. Repeat CT scan of the head to be done on 5/12.    Leukocytosis: Likely stress margination plus secondary to steroids.    Dysphagia: Felt to be secondary to enlarged thyroid. Patient able to speak in full sentences and seen by ear nose and throat who felt no evidence of thyroid abscess or cellulitis. After ENT recommendations, Patient underwent by interventional radiology biopsy. Pathology still pending at this time.   SOB (shortness of breath)    Poor dentition: Unable to get Panorex films. Dental consult pending.    Anemia: Likely from chronic disease. Following H&H.    Pleural effusion: Followed by pulmonary. Daily chest x-rays and if patient's pleural effusions worsen or shortness of breath declines, thoracentesis will need to be done.    Hyperthyroidism/enlarged thyroid: Questionable etiology. Thyroid studies noted suppressed TSH and elevated free T4/T3. Seen in consultation by endocrinology. Started on methimazole plus Decadron. Ultrasound noted enlarged inhomogenous thyroid gland with nonspecific calcification a single area in the right lobe with no discrete thyroid mass.    SDH (subdural hematoma): As above. Telemetry with neuro checks, repeat CT scan tomorrow. Low-dose and a coagulation has been restarted.   HCAP (healthcare-associated pneumonia)   Code Status: Full code  Family Communication: No family present  Disposition Plan: Continue step down status until patient is felt to be stable and safety continue chronic anticoagulation   Consultants:  Oncology  EENT  Endocrinology  Pulmonary  Interventional  radiology  Procedures:  Ultrasound-guided biopsy of  left side of neck lymph nodes by interventional radiology done 5/9  Antibiotics:  IV Zosyn 5/9-present  IV vancomycin 5/10-present   Objective: BP 180/89 mmHg  Pulse 86  Temp(Src) 98.2 F (36.8 C) (Oral)  Resp 19  Ht '5\' 7"'$  (1.702 m)  Wt 63.2 kg (139 lb 5.3 oz)  BMI 21.82 kg/m2  SpO2 94%  Intake/Output Summary (Last 24 hours) at 07/09/14 1903 Last data filed at 07/09/14 1200  Gross per 24 hour  Intake 1645.5 ml  Output    625 ml  Net 1020.5 ml   Filed Weights   07/04/14 1422 07/05/14 0400 07/08/14 0500  Weight: 58.5 kg (128 lb 15.5 oz) 61 kg (134 lb 7.7 oz) 63.2 kg (139 lb 5.3 oz)    Exam:   General:  Alert and oriented 3, no acute distress  Cardiovascular: Regular rate and rhythm, S1-S2  Respiratory: Limited inspiratory effort, otherwise clear to auscultation bilaterally  Abdomen: Soft, nontender, nondistended with positive bowel sounds  Musculoskeletal: No clubbing or cyanosis, trace edema   Data Reviewed: Basic Metabolic Panel:  Recent Labs Lab 07/04/14 1600 07/05/14 0347 07/06/14 0405 07/07/14 0401 07/08/14 0400 07/09/14 0340  NA  --  135 137 136 137 137  K  --  4.4 3.8 4.2 4.2 4.4  CL  --  108 110 110 108 107  CO2  --  19* 19* 18* 20* 20*  GLUCOSE  --  149* 104* 122* 112* 114*  BUN  --  32* 37* 31* 33* 35*  CREATININE 0.56* 0.44* 0.47* 0.49* 0.50* 0.51*  CALCIUM  --  8.5* 8.4* 8.1* 7.9* 7.8*  MG 2.2  --   --  2.5* 2.4 2.5*   Liver Function Tests:  Recent Labs Lab 07/04/14 1154 07/05/14 0347 07/08/14 0400 07/09/14 0340  AST 34 30 41 37  ALT 32 34 46 51  ALKPHOS 66 68 59 59  BILITOT 1.1 1.0 1.0 1.1  PROT 5.8* 5.8* 5.3* 5.3*  ALBUMIN 3.2* 3.0* 2.7* 2.7*   No results for input(s): LIPASE, AMYLASE in the last 168 hours. No results for input(s): AMMONIA in the last 168 hours. CBC:  Recent Labs Lab 07/04/14 1154  07/05/14 0347 07/06/14 0405 07/07/14 0401 07/08/14 0400  07/09/14 0340  WBC 20.0*  < > 16.3* 26.0* 20.9* 23.2* 22.9*  NEUTROABS 15.0*  --   --   --  17.7* 19.8*  --   HGB 10.4*  < > 10.3* 9.6* 8.8* 9.0* 9.2*  HCT 31.4*  < > 31.0* 29.3* 26.3* 27.4* 28.3*  MCV 94.9  < > 93.9 95.1 94.6 94.5 94.6  PLT 162  < > 160 182 172 167 179  < > = values in this interval not displayed. Cardiac Enzymes:   No results for input(s): CKTOTAL, CKMB, CKMBINDEX, TROPONINI in the last 168 hours. BNP (last 3 results) No results for input(s): BNP in the last 8760 hours.  ProBNP (last 3 results) No results for input(s): PROBNP in the last 8760 hours.  CBG:  Recent Labs Lab 07/05/14 0841 07/06/14 0755 07/07/14 0814 07/08/14 0805 07/09/14 0747  GLUCAP 143* 102* 106* 100* 100*    Recent Results (from the past 240 hour(s))  Culture, blood (routine x 2)     Status: None (Preliminary result)   Collection Time: 07/04/14  1:54 PM  Result Value Ref Range Status   Specimen Description BLOOD LEFT ANTECUBITAL  Final   Special Requests BOTTLES DRAWN AEROBIC AND ANAEROBIC 5 CC EA  Final   Culture  Final           BLOOD CULTURE RECEIVED NO GROWTH TO DATE CULTURE WILL BE HELD FOR 5 DAYS BEFORE ISSUING A FINAL NEGATIVE REPORT Performed at [facility]    Report Status PENDING  Incomplete  Culture, blood (routine x 2)     Status: None (Preliminary result)   Collection Time: 07/04/14  1:55 PM  Result Value Ref Range Status   Specimen Description BLOOD BLOOD RIGHT FOREARM  Final   Special Requests BOTTLES DRAWN AEROBIC AND ANAEROBIC 5 CC EA  Final   Culture   Final           BLOOD CULTURE RECEIVED NO GROWTH TO DATE CULTURE WILL BE HELD FOR 5 DAYS BEFORE ISSUING A FINAL NEGATIVE REPORT Performed at [facility]    Report Status PENDING  Incomplete  Culture, Urine     Status: None   Collection Time: 07/04/14  1:59 PM  Result Value Ref Range Status   Specimen Description URINE, RANDOM  Final   Special Requests NONE  Final   Colony Count NO  GROWTH Performed at [facility]   Final   Culture NO GROWTH Performed at [facility]   Final   Report Status 07/05/2014 FINAL  Final  MRSA PCR Screening     Status: None   Collection Time: 07/04/14  2:42 PM  Result Value Ref Range Status   MRSA by PCR NEGATIVE NEGATIVE Final    Comment:        The GeneXpert MRSA Assay (FDA approved for NASAL specimens only), is one component of a comprehensive MRSA colonization surveillance program. It is not intended to diagnose MRSA infection nor to guide or monitor treatment for MRSA infections.      Studies: Nm Bone Scan Whole Body  07/08/2014   CLINICAL DATA:  74 year old male with bony metastatic disease.  EXAM: NUCLEAR MEDICINE WHOLE BODY BONE SCAN  TECHNIQUE: Whole body anterior and posterior images were obtained approximately 3 hours after intravenous injection of radiopharmaceutical.  RADIOPHARMACEUTICALS:  25.1 Technetium-34mMDP IV  COMPARISON:  Recent imaging.  FINDINGS: Scattered areas of abnormal increased activity within multiple bilateral ribs are identified compatible with bony lesions/ metastatic disease on recent chest CT. The most pronounced areas include the posterior right seventh rib in the anterior left fifth rib.  No suspicious bony abnormalities are identified.  Degenerative changes within the shoulders and knees bilaterally are noted.  A focus of increased activity within the lateral soft tissues of the upper left arm is indeterminate.  IMPRESSION: Abnormal increased bilateral rib activity compatible with metastatic disease. No other suspicious bony abnormalities identified.  Focus of increased activity within the lateral soft tissues of the upper left arm -indeterminate. Correlate clinically.  Degenerative changes as described.   Electronically Signed   By: JMargarette CanadaM.D.   On: 07/08/2014 13:11   Dg Swallowing Func-speech Pathology  07/08/2014    Objective Swallowing Evaluation:    Patient Details  Name:  [name] [name]: [id number] of Birth: 6Sep 22, 1942 Today's Date: 07/08/2014 Time: SLP Start Time (ACUTE ONLY): 1235-SLP Stop Time (ACUTE ONLY): 1305 SLP Time Calculation (min) (ACUTE ONLY): 30 min  Past Medical History:  Past Medical History  Diagnosis Date  . Lung nodule 2012  . Insomnia     takes Restoril nightly as needed  . Hypertension     takes Benazepril and Metoprolol daily  . Pneumonia     hx of;many yrs ago  .  AAA (abdominal aortic aneurysm)   . Joint pain   . Joint swelling   . Urinary frequency     takes Terazosin daily  . Urinary urgency   . Nocturia   . Enlarged prostate     slightly  . Rheumatoid arthritis(714.0)     takes Plaquenil daily and Methotrexate weekly   Past Surgical History:  Past Surgical History  Procedure Laterality Date  . Inguinal hernia repair  2008  . Appendectomy    . Abdominal aortic endovascular stent graft Bilateral 12/11/2013    Procedure: ABDOMINAL AORTIC ENDOVASCULAR STENT GRAFT; left groin  exploration;  Surgeon: Rosetta Posner, MD;  Location: [facility] OR;  Service:  Vascular;  Laterality: Bilateral;   HPI:  Other Pertinent Information: 74 year old male with a known diagnosis of  COPD who came to the hospital because of some weight loss, shortness of  breath, and back pain. He was found to have CT imaging findings suggestive  of widespread metastasis from an uncertain primary malignancy.  Pt was  admitted for further workup with oncology and ENT per MD notes.  Per CT  neck, pt with massive enlargement of thyroid lesion (left lobe) with  phlegmon/inflammation 07/03/14.   Difficulty swallowing food more than drink  reported by pt -progressing within the last few weeks.  Pt states swelling  in neck present x1 month but progressed in 2 weeks.  Esophagram completed  with pt having aspiration - esophagus negative.  Pt now with pna per CXR.   MD reordered SLP swallow evaluation due to pt coughing with intake.    No Data Recorded  Assessment / Plan / Recommendation CHL IP CLINICAL IMPRESSIONS  07/08/2014  Therapy Diagnosis Severe cervical esophageal phase dysphagia  Clinical Impression Pt has a severe cervical esophageal phase dysphagia  presumed to be due obstructive to left thyroid mass/lymph node  involvement.  Gross pharyngeal stasis noted with minimal barium transiting  into esophagus and aspiration.  Multtiple strategies attempted to aid  clearance including head turn, chin tuck, following solids with liquids,  etc.  Fortunately pt was able to expectorate barium mixed with secretions  throughout the study (expectorating approximately 90% of boluses).  He did  aspirate a small amount of liquids without protective cough response.  Pt  is at very high malnutrition and aspiration risk due to his level of  dysphagia.  ? if pt would benefit from consideration for alternative means  of nutrition for nutriional support as he goes through treatment.  Using  live video, SLP educated pt to findings and concerns for aspiraton and  nutrition.  Recommend consider NPO except ice and water to decrease disuse  muscle atrophy.   Hopeful for improvement in swallowing with treatment.        CHL IP TREATMENT RECOMMENDATION 07/08/2014  Treatment Recommendations Therapy as outlined in treatment plan below     CHL IP DIET RECOMMENDATION 07/08/2014  SLP Diet Recommendations NPO except water/ice to prevent disuse muscle  atrophy  Liquid Administration via (None)  Medication Administration Via alternative means  Compensations (None)  Postural Changes and/or Swallow Maneuvers (None)     CHL IP OTHER RECOMMENDATIONS 07/08/2014  Recommended Consults (None)  Oral Care Recommendations Oral care BID  Other Recommendations (None)     No flowsheet data found.   CHL IP FREQUENCY AND DURATION 07/08/2014  Speech Therapy Frequency (ACUTE ONLY) (None)  Treatment Duration 1 week     Pertinent Vitals/Pain     SLP Swallow Goals No flowsheet data  found.  No flowsheet data found.    CHL IP REASON FOR REFERRAL 07/08/2014  Reason for Referral  Objectively evaluate swallowing function     CHL IP ORAL PHASE 07/08/2014  Lips (None)  Tongue (None)  Mucous membranes (None)  Nutritional status (None)  Other (None)  Oxygen therapy (None)  Oral Phase Impaired  Oral - Pudding Teaspoon (None)  Oral - Pudding Cup (None)  Oral - Honey Teaspoon (None)  Oral - Honey Cup (None)  Oral - Honey Syringe (None)  Oral - Nectar Teaspoon (None)  Oral - Nectar Cup (None)  Oral - Nectar Straw (None)  Oral - Nectar Syringe (None)  Oral - Ice Chips (None)  Oral - Thin Teaspoon (None)  Oral - Thin Cup (None)  Oral - Thin Straw (None)  Oral - Thin Syringe (None)  Oral - Puree (None)  Oral - Mechanical Soft (None)  Oral - Regular (None)  Oral - Multi-consistency (None)  Oral - Pill (None)  Oral Phase - Comment (None)      CHL IP PHARYNGEAL PHASE 07/08/2014  Pharyngeal Phase Impaired  Pharyngeal - Pudding Teaspoon (None)  Penetration/Aspiration details (pudding teaspoon) (None)  Pharyngeal - Pudding Cup (None)  Penetration/Aspiration details (pudding cup) (None)  Pharyngeal - Honey Teaspoon (None)  Penetration/Aspiration details (honey teaspoon) (None)  Pharyngeal - Honey Cup (None)  Penetration/Aspiration details (honey cup) (None)  Pharyngeal - Honey Syringe (None)  Penetration/Aspiration details (honey syringe) (None)  Pharyngeal - Nectar Teaspoon (None)  Penetration/Aspiration details (nectar teaspoon) (None)  Pharyngeal - Nectar Cup (None)  Penetration/Aspiration details (nectar cup) (None)  Pharyngeal - Nectar Straw (None)  Penetration/Aspiration details (nectar straw) (None)  Pharyngeal - Nectar Syringe (None)  Penetration/Aspiration details (nectar syringe) (None)  Pharyngeal - Ice Chips (None)  Penetration/Aspiration details (ice chips) (None)  Pharyngeal - Thin Teaspoon (None)  Penetration/Aspiration details (thin teaspoon) (None)  Pharyngeal - Thin Cup (None)  Penetration/Aspiration details (thin cup) (None)  Pharyngeal - Thin Straw (None)  Penetration/Aspiration details (thin  straw) (None)  Pharyngeal - Thin Syringe (None)  Penetration/Aspiration details (thin syringe') (None)  Pharyngeal - Puree (None)  Penetration/Aspiration details (puree) (None)  Pharyngeal - Mechanical Soft (None)  Penetration/Aspiration details (mechanical soft) (None)  Pharyngeal - Regular (None)  Penetration/Aspiration details (regular) (None)  Pharyngeal - Multi-consistency (None)  Penetration/Aspiration details (multi-consistency) (None)  Pharyngeal - Pill (None)  Penetration/Aspiration details (pill) (None)  Pharyngeal Comment pt able to expectorate residuals with cues - multiple  compensation strategies attempted to aid clearance into esophagus without  avail      CHL IP CERVICAL ESOPHAGEAL PHASE 07/08/2014  Cervical Esophageal Phase Impaired  Pudding Teaspoon (None)  Pudding Cup (None)  Honey Teaspoon (None)  Honey Cup (None)  Honey Straw (None)  Nectar Teaspoon (None)  Nectar Cup (None)  Nectar Straw (None)  Nectar Sippy Cup (None)  Thin Teaspoon (None)  Thin Cup (None)  Thin Straw (None)  Thin Sippy Cup (None)  Cervical Esophageal Comment very poor clearance into esophagus due to  mechanical obstruction from thyroid mass    No flowsheet data found.         Luanna Salk, MS [facility] SLP [phone]     Scheduled Meds: . acetaminophen  500 mg Oral TID  . antiseptic oral rinse  7 mL Mouth Rinse BID  . benazepril  40 mg Oral Daily  . budesonide-formoterol  2 puff Inhalation BID  . dexamethasone  8 mg Oral Q12H  . docusate  100 mg Oral BID  . feeding  supplement (ENSURE ENLIVE)  237 mL Oral BID BM  . feeding supplement (RESOURCE BREEZE)  1 Container Oral BID BM  . guaiFENesin  1,200 mg Oral BID  . methimazole  60 mg Oral Daily  . OxyCODONE  15 mg Oral Q12H  . pantoprazole  40 mg Oral BID  . piperacillin-tazobactam (ZOSYN)  IV  3.375 g Intravenous Q8H  . propranolol  60 mg Oral QID  . sodium chloride  10-40 mL Intracatheter Q12H  . sodium chloride  3 mL Intravenous Q12H  . vancomycin  750 mg  Intravenous Q12H    Continuous Infusions: . sodium chloride 50 mL/hr at 07/09/14 1348  . heparin 1,150 Units/hr (07/09/14 1505)     Time spent: 25 minutes  [facility] Pager [phone]. If 7PM-7AM, please contact night-coverage at [email or web address], password [employer] 07/09/2014, 7:03 PM  LOS: 5 days

## 2014-07-09 NOTE — Progress Notes (Signed)
SLP Cancellation Note  Patient Details Name: [name] [name] MRN: [id number] DOB: 1941-02-06   Cancelled treatment:       Reason Eval/Treat Not Completed: Other (comment) (pt at radiation treatment)   Luanna Salk, [facility] [phone]

## 2014-07-09 NOTE — Progress Notes (Signed)
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 Other     Catfish + IV pain medication (unknown name)-vomits    Patient Measurements: Height: '5\' 7"'$  (170.2 cm) Weight: 139 lb 5.3 oz (63.2 kg) IBW/kg (Calculated) : 66.1 Heparin Dosing Weight:   Vital Signs: Temp: 97.7 F (36.5 C) (05/11 2000) Temp Source: Oral (05/11 2000) BP: 151/65 mmHg (05/11 2200) Pulse Rate: 72 (05/11 2200)  Labs:  Recent Labs  07/07/14 0401 07/08/14 0400  07/09/14 0340 07/09/14 1330 07/09/14 2315  HGB 8.8* 9.0*  --  9.2*  --   --   HCT 26.3* 27.4*  --  28.3*  --   --   PLT 172 167  --  179  --   --   LABPROT 18.2*  --   --   --   --   --   INR 1.49  --   --   --   --   --   HEPARINUNFRC  --   --   < > 0.11* 0.17* 0.27*  CREATININE 0.49* 0.50*  --  0.51*  --   --   < > = values in this interval not displayed.  Estimated Creatinine Clearance: 75.7 mL/min (by C-G formula based on Cr of 0.51).   Medications:  Infusions:  . sodium chloride 50 mL/hr at 07/09/14 1348  . heparin 1,150 Units/hr (07/09/14 2300)  . [START ON 07/10/2014] heparin      Assessment: Patient with new thrombus and then left side subdural hematoma noted on head MRI therefore reduced heparin goal of 0.3-0.5 and no bolus for heparin.  Heparin level just below reduced goal.  No heparin issues per RN.  Goal of Therapy:  Heparin level 0.3-0.5 units/ml Monitor platelets by anticoagulation protocol: Yes   Plan:  Increase heparin to 1200 units/hr Recheck level at 0800  Nani Skillern Crowford 07/09/2014,11:53 PM

## 2014-07-09 NOTE — Progress Notes (Signed)
  Radiation Oncology         [phone] ________________________________  Name: [name] [name] MRN: [id number]  Date: 07/09/2014  DOB: 1940/12/12  SIMULATION AND TREATMENT PLANNING NOTE  Inpatient  DIAGNOSIS:     ICD-9-CM ICD-10-CM   1. Bone metastases 198.5 C79.51        NARRATIVE:  The patient was brought to the [place].  Identity was confirmed.  All relevant records and images related to the planned course of therapy were reviewed.  The patient freely provided informed written consent to proceed with treatment after reviewing the details related to the planned course of therapy. The consent form was witnessed and verified by the simulation staff.    Experiencing right hip pain. Bone scan doesn't show any obvious disease in the hip, nor in the spine.  Pain on palpation of right hip, deep, but not in ischium or iliac crest.    When he lifts his leg up it hurts inside, as if in the socket. For this reasons we agree to empirically treat the right hip and see if it helps alleviate the pain.     Will also treat L2-S1, bilateral rib lesions, and thyroid mass.   Then, the patient was set-up in a stable reproducible  supine position for radiation therapy.  CT images were obtained.  Surface markings were placed.  Aquaplast mask was made.The CT images were loaded into the planning software.    TREATMENT PLANNING NOTE: Treatment planning then occurred.  The radiation prescription was entered and confirmed.    A total of 14 medically necessary complex treatment devices were fabricated and supervised by me: 13 fields with MLCs to block bowel, cord, esophagus, kidneys, heart, lungs. I have requested : 3D Simulation  I have requested a DVH of the following structures: bowel, cord, esophagus, kidneys, heart, lungs.    The patient will receive 30 Gy in 10 fractions to the neck and L2-S1 spine. He will receive 8 Gy and 1 fraction to the right hip and painful bilateral rib  legions.   This document serves as a record of services personally performed by Eppie Gibson, MD. It was created on her behalf by Arlyce Harman, a trained medical scribe. The creation of this record is based on the scribe's personal observations and the provider's statements to them. This document has been checked and approved by the attending provider.  -----------------------------------  Eppie Gibson, MD

## 2014-07-09 NOTE — Progress Notes (Signed)
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  He is on a heparin infusion. He has a left subclavian vein thrombus. He has some slight swelling in the left arm.  I really think that a PICC line will help him. I think a double or triple lumen PICC line will be very reasonable and make life easier for him.  I did speak with pathology yesterday. They are still working on the biopsy specimen. The pathologist says this is likely a carcinoma. He does not think this is a lymphoma. He is still awaiting special stains. I have a feeling that they will not know what the primary is. I suspect that they probably will sign the specimen out as a poorly differentiated carcinoma. If that is the case, then I think that we will have to send the specimen for genetic array testing (ie [facility]) to see with a primary site is.  He had his bone scan done yesterday. It was noted that there were areas of activity within the ribs. Shockingly enough, he did not show up anything in his lumbar spine. I will have to speak to the radiologist about this.  I think nutrition is going to be key. He had a swallowing study yesterday. Did not look like this went well. Speech therapy recommended nothing by mouth. He says that he seems to be swallowing okay. I am not sure how much he really is getting in. We may have to consider a feeding tube for him.  He will start radiation today. I think that we need to see radiation therapy can consider radiating the thyroid.  This continues to be extremely complex case. His labs looked okay. His albumin is 2.7. He is still a little anemic.  On his physical exam, I cannot find any changes. His vital signs are stable. Blood pressure has been on the high side. When I saw him this morning, his blood pressure was 170/71. His thyroid area is still a little swollen but no worse. Lungs are clear. Cardiac exam regular rate and rhythm with no murmurs, rubs or bruits. Abdomen is soft. There is no mass. There is no palpable  liver or spleen tip. Extremities still shows the weakness in his right foot.  For now, I think the main avenue of therapy is going to be radiation. We will have to await the biopsy results. Again, I have a bad feeling that we will not know what the primary site is. He was a smoker so one I think of lung cancer. His CA 19-9 is elevated. This might indicate a gastrointestinal primary.  Interventional radiology does not think that kyphoplasty can be done.  Again, the radiation therapy can provide some benefit for him. This will be started today. I think he probably needs multiple areas radiated.  Waldemar Dickens 9:9

## 2014-07-09 NOTE — Progress Notes (Signed)
Nutrition Follow-up  DOCUMENTATION CODES:  Not applicable  INTERVENTION: - Resource Breeze po BID, each supplement provides 250 kcal and 9 grams of protein - Ensure Enlive po BID, each supplement provides 350 kcal and 20 grams of protein  - If TF considered, recommend initiation of Osmolite 1.2 @ 20 ml/hr and increase by 10 ml every 4 hours to goal rate of 65 ml/hr.   Tube feeding regimen provides 1872 kcal (100% of needs), 86 grams of protein, and 1279 ml of H2O.   NUTRITION DIAGNOSIS:  Inadequate oral intake related to inability to eat as evidenced by NPO status.  ongoing  GOAL:  Patient will meet greater than or equal to 90% of their needs  Not met  MONITOR:  Diet advancement, Weight trends, Labs  ASSESSMENT: 74 y.o. patient with metastatic cancer with complaints of a 3 month history of chest pain, bilateral rib pain left greater than right, weakness, neck swelling which has worsened over the past 3 weeks to the point where he sometimes has difficulty breathing and problems swallowing. Patient also with a six-month period of a 10 pound weight loss. Patient does endorse decreased oral intake and decreased appetite over the past month. Patient states recently he's been able to eat is a piece of bread and a cup of milk daily.  5/10: -Pt with an 8 lb wt loss since November 2015. (5%- not significant for time frame) - Pt failed SLP evaluation and is currently NPO. SLP recommends nutrition via alternative means.  - Pt reports that he does not want a "hole in his stomach." - RD will monitor for diet advancement vs tube feeding initiation.  5/11: - Per RN, pt wanted to eat despite being made aware of aspiration risk. Diet upgraded to soft diet.  - Pt ate ~25% of breakfast. He said that he has not eaten very much because "he has been kept busy." - RD to order supplements and monitor for tolerance.  - TF recomendations provided if pt does not tolerate po diet.   Height:  Ht  Readings from Last 1 Encounters:  07/04/14 5' 7"  (1.702 m)    Weight:  Wt Readings from Last 1 Encounters:  07/08/14 139 lb 5.3 oz (63.2 kg)    Ideal Body Weight:  67.3 kg  Wt Readings from Last 10 Encounters:  07/08/14 139 lb 5.3 oz (63.2 kg)  01/14/14 147 lb 3.2 oz (66.769 kg)  12/11/13 147 lb 4.3 oz (66.8 kg)  12/05/13 148 lb 1.6 oz (67.178 kg)  11/29/13 149 lb (67.586 kg)  11/19/13 146 lb 11.2 oz (66.543 kg)  11/12/13 145 lb (65.772 kg)  08/25/11 140 lb (63.504 kg)  02/09/10 143 lb (64.864 kg)  02/19/09 148 lb (67.132 kg)    BMI:  Body mass index is 21.82 kg/(m^2).  Estimated Nutritional Needs:  Kcal:  1650-1850  Protein:  85-95 g  Fluid:  1.7-1.9 L/day  Skin:  Wound (see comment)  Diet Order:  DIET SOFT Room service appropriate?: Yes; Fluid consistency:: Thin  EDUCATION NEEDS:  Education needs addressed   Intake/Output Summary (Last 24 hours) at 07/09/14 1226 Last data filed at 07/09/14 1000  Gross per 24 hour  Intake 1909.5 ml  Output    750 ml  Net 1159.5 ml    Last BM:  5/10  Laurette Schimke Philip, [place], [place]

## 2014-07-09 NOTE — Progress Notes (Signed)
Date: Jul 09, 2014 Chart reviewed for concurrent status and case management needs. Velva Harman, RN, BSN, [place]   [phone]

## 2014-07-10 ENCOUNTER — Inpatient Hospital Stay (HOSPITAL_COMMUNITY): Payer: BC Managed Care – PPO

## 2014-07-10 ENCOUNTER — Ambulatory Visit
Admit: 2014-07-10 | Discharge: 2014-07-10 | Disposition: A | Discharge status: Home / Self Care | Payer: BC Managed Care – PPO | Attending: Radiation Oncology | Admitting: Radiation Oncology

## 2014-07-10 DIAGNOSIS — J9601 Acute respiratory failure with hypoxia: Secondary | ICD-10-CM

## 2014-07-10 DIAGNOSIS — J9600 Acute respiratory failure, unspecified whether with hypoxia or hypercapnia: Secondary | ICD-10-CM | POA: Diagnosis present

## 2014-07-10 LAB — CBC WITH DIFFERENTIAL/PLATELET
Basophils Absolute: 0 10*3/uL (ref 0.0–0.1)
Basophils Relative: 0 % (ref 0–1)
EOS ABS: 0.4 10*3/uL (ref 0.0–0.7)
Eosinophils Relative: 1 % (ref 0–5)
HCT: 26.7 % — ABNORMAL LOW (ref 39.0–52.0)
Hemoglobin: 8.6 g/dL — ABNORMAL LOW (ref 13.0–17.0)
LYMPHS PCT: 4 % — AB (ref 12–46)
Lymphs Abs: 1.2 10*3/uL (ref 0.7–4.0)
MCH: 30.9 pg (ref 26.0–34.0)
MCHC: 32.2 g/dL (ref 30.0–36.0)
MCV: 96 fL (ref 78.0–100.0)
Monocytes Absolute: 1.7 10*3/uL — ABNORMAL HIGH (ref 0.1–1.0)
Monocytes Relative: 6 % (ref 3–12)
NEUTROS ABS: 26 10*3/uL — AB (ref 1.7–7.7)
Neutrophils Relative %: 89 % — ABNORMAL HIGH (ref 43–77)
PLATELETS: 189 10*3/uL (ref 150–400)
RBC: 2.78 MIL/uL — ABNORMAL LOW (ref 4.22–5.81)
RDW: 14.4 % (ref 11.5–15.5)
WBC: 29.4 10*3/uL — AB (ref 4.0–10.5)

## 2014-07-10 LAB — CULTURE, BLOOD (ROUTINE X 2)
Culture: NO GROWTH
Culture: NO GROWTH

## 2014-07-10 LAB — COMPREHENSIVE METABOLIC PANEL
ALBUMIN: 2.5 g/dL — AB (ref 3.5–5.0)
ALK PHOS: 55 U/L (ref 38–126)
ALT: 50 U/L (ref 17–63)
ANION GAP: 3 — AB (ref 5–15)
AST: 33 U/L (ref 15–41)
BILIRUBIN TOTAL: 0.9 mg/dL (ref 0.3–1.2)
BUN: 34 mg/dL — ABNORMAL HIGH (ref 6–20)
CO2: 18 mmol/L — ABNORMAL LOW (ref 22–32)
Calcium: 6.7 mg/dL — ABNORMAL LOW (ref 8.9–10.3)
Chloride: 115 mmol/L — ABNORMAL HIGH (ref 101–111)
Creatinine, Ser: 0.36 mg/dL — ABNORMAL LOW (ref 0.61–1.24)
Glucose, Bld: 111 mg/dL — ABNORMAL HIGH (ref 65–99)
POTASSIUM: 3.3 mmol/L — AB (ref 3.5–5.1)
Sodium: 136 mmol/L (ref 135–145)
Total Protein: 4.7 g/dL — ABNORMAL LOW (ref 6.5–8.1)

## 2014-07-10 LAB — CBC
HEMATOCRIT: 28.7 % — AB (ref 39.0–52.0)
Hemoglobin: 9.3 g/dL — ABNORMAL LOW (ref 13.0–17.0)
MCH: 30.8 pg (ref 26.0–34.0)
MCHC: 32.4 g/dL (ref 30.0–36.0)
MCV: 95 fL (ref 78.0–100.0)
Platelets: 201 10*3/uL (ref 150–400)
RBC: 3.02 MIL/uL — ABNORMAL LOW (ref 4.22–5.81)
RDW: 14.5 % (ref 11.5–15.5)
WBC: 30.1 10*3/uL — ABNORMAL HIGH (ref 4.0–10.5)

## 2014-07-10 LAB — THYROID STIMULATING IMMUNOGLOBULIN: TSI: 17 % baseline (ref <140)

## 2014-07-10 LAB — VANCOMYCIN, TROUGH: Vancomycin Tr: 8 ug/mL — ABNORMAL LOW (ref 10.0–20.0)

## 2014-07-10 LAB — GLUCOSE, CAPILLARY: Glucose-Capillary: 169 mg/dL — ABNORMAL HIGH (ref 65–99)

## 2014-07-10 LAB — HEPARIN LEVEL (UNFRACTIONATED)
HEPARIN UNFRACTIONATED: 0.34 [IU]/mL (ref 0.30–0.70)
Heparin Unfractionated: 0.33 IU/mL (ref 0.30–0.70)

## 2014-07-10 MED ORDER — VANCOMYCIN HCL IN DEXTROSE 750-5 MG/150ML-% IV SOLN
750.0000 mg | Freq: Three times a day (TID) | INTRAVENOUS | Status: DC
  Administered 2014-07-10 – 2014-07-15 (×16): 750 mg via INTRAVENOUS
  Filled 2014-07-10 (×18): qty 150

## 2014-07-10 NOTE — Progress Notes (Signed)
07/10/2014 9:50 AM  Scheryl Darter [id number]  Hosp Day 6    Temp:  [97.3 F (36.3 C)-98.4 F (36.9 C)] 98.4 F (36.9 C) (05/12 0804) Pulse Rate:  [68-86] 76 (05/12 0600) Resp:  [12-19] 18 (05/12 0600) BP: (123-180)/(56-92) 145/63 mmHg (05/12 0600) SpO2:  [94 %-98 %] 98 % (05/12 0804) FiO2 (%):  [30 %] 30 % (05/11 2056) Weight:  [62.7 kg (138 lb 3.7 oz)] 62.7 kg (138 lb 3.7 oz) (05/12 0421),     Intake/Output Summary (Last 24 hours) at 07/10/14 0950 Last data filed at 07/10/14 0600  Gross per 24 hour  Intake 1553.42 ml  Output    700 ml  Net 853.42 ml    Results for orders placed or performed during the hospital encounter of 07/04/14 (from the past 24 hour(s))  Heparin level (unfractionated)     Status: Abnormal   Collection Time: 07/09/14  1:30 PM  Result Value Ref Range   Heparin Unfractionated 0.17 (L) 0.30 - 0.70 IU/mL  Heparin level (unfractionated)     Status: Abnormal   Collection Time: 07/09/14 11:15 PM  Result Value Ref Range   Heparin Unfractionated 0.27 (L) 0.30 - 0.70 IU/mL  CBC     Status: Abnormal   Collection Time: 07/10/14  4:20 AM  Result Value Ref Range   WBC 30.1 (H) 4.0 - 10.5 K/uL   RBC 3.02 (L) 4.22 - 5.81 MIL/uL   Hemoglobin 9.3 (L) 13.0 - 17.0 g/dL   HCT 28.7 (L) 39.0 - 52.0 %   MCV 95.0 78.0 - 100.0 fL   MCH 30.8 26.0 - 34.0 pg   MCHC 32.4 30.0 - 36.0 g/dL   RDW 14.5 11.5 - 15.5 %   Platelets 201 150 - 400 K/uL  Glucose, capillary     Status: Abnormal   Collection Time: 07/10/14  8:18 AM  Result Value Ref Range   Glucose-Capillary 169 (H) 65 - 99 mg/dL  Vancomycin, trough     Status: Abnormal   Collection Time: 07/10/14  8:46 AM  Result Value Ref Range   Vancomycin Tr 8 (L) 10.0 - 20.0 ug/mL  Heparin level (unfractionated)     Status: None   Collection Time: 07/10/14  8:46 AM  Result Value Ref Range   Heparin Unfractionated 0.33 0.30 - 0.70 IU/mL    SUBJECTIVE:  No real change in symptoms.    OBJECTIVE:  Pathology not yet proven, but  suspected to be lung cancer.    IMPRESSION:  Satisfactory check  PLAN:  To begin RT today.  Not sure exactly where/what is going to be treated.  I am available if needed.  Thanks,  [name] [name]

## 2014-07-10 NOTE — Progress Notes (Addendum)
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  See pharmacist note from earlier today for further detail.  Heparin level remains therapeutic at 0.34 with infusion at 1200 units/hr  Goal of Therapy:  Heparin level 0.3-0.5 units/ml (will aim for lower end of usual heparin goal range of 0.3-0.7 d/t subdural hematoma) Monitor platelets by anticoagulation protocol: Yes   Plan:  - continue heparin drip at 1200 units/hr - daily heparin level  Hershal Coria 07/10/2014,7:35 PM

## 2014-07-10 NOTE — Progress Notes (Signed)
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 Other     Catfish + IV pain medication (unknown name)-vomits    Patient Measurements: Height: '5\' 7"'$  (170.2 cm) Weight: 138 lb 3.7 oz (62.7 kg) IBW/kg (Calculated) : 66.1 Heparin Dosing Weight: 63 kg  Vital Signs: Temp: 98.4 F (36.9 C) (05/12 0804) Temp Source: Axillary (05/12 0804) BP: 145/63 mmHg (05/12 0600) Pulse Rate: 76 (05/12 0600)  Labs:  Recent Labs  07/08/14 0400  07/09/14 0340 07/09/14 1330 07/09/14 2315 07/10/14 0420 07/10/14 0846  HGB 9.0*  --  9.2*  --   --  9.3*  --   HCT 27.4*  --  28.3*  --   --  28.7*  --   PLT 167  --  179  --   --  201  --   HEPARINUNFRC  --   < > 0.11* 0.17* 0.27*  --  0.33  CREATININE 0.50*  --  0.51*  --   --   --   --   < > = values in this interval not displayed.  Estimated Creatinine Clearance: 75.1 mL/min (by C-G formula based on Cr of 0.51).   Medical History: Past Medical History  Diagnosis Date  . Lung nodule 2012  . Insomnia     takes Restoril nightly as needed  . Hypertension     takes Benazepril and Metoprolol daily  . Pneumonia     hx of;many yrs ago  . AAA (abdominal aortic aneurysm)   . Joint pain   . Joint swelling   . Urinary frequency     takes Terazosin daily  . Urinary urgency   . Nocturia   . Enlarged prostate     slightly  . 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  Full dose lovenox was initiated on 5/6 but was d/ced on 5/8 d/t left side subdural hematoma noted on head MRI. To resume anticoagulation with heparin today for thrombosis per MD's request.  Significant events: - neck lymph node biopsy on 5/9 - PICC placement and radiation treatment on  5/11 - head CT on 5/12: stable hematoma  Today, 07/10/14  - heparin level is finally therapeutic at 0.33 - cbc is stable. No active bleeding documented   Goal of Therapy:  Heparin level 0.3-0.5 units/ml (will aim for lower end of usual heparin goal range of 0.3-0.7 d/t subdural hematoma) Monitor platelets by anticoagulation protocol: Yes   Plan:  - continue heparin drip at 1200 units/hr (no bolus) - re-check another heparin level at 5 PM to confirm before changing to daily heparin level monitoring - monitor for s/s of bleeding  Sachi Boulay P 07/10/2014,9:50 AM

## 2014-07-10 NOTE — Progress Notes (Signed)
PROGRESS NOTE  [name] [name] YPP:[id number] DOB: February 04, 1941 DOA: 07/04/2014 PCP: [name], MD  HPI/Recap of past 55 hours: 74 year old male with past medical history of rheumatoid arthritis and hypertension had been having 3 month history of bilateral chest pain as well as 3 weeks of neck swelling to the point it was affecting his breathing and swallowing. Patient also reported a one-week history of hemoptysis. Seen by his PCP and underwent a outpatient CT scan noted massive thyroid gland enlargement with adjacent phlegmon left greater than right and necrotic cervical lymphadenopathy compatible with metastatic disease versus acute suppurative thyroiditis. Patient also noted to have small bilateral pleural effusions, a thrombus of the junction of left subclavian vein/left brachiocephalic faint. CT scan of chest also noted ground glass opacities and haziness and lung lobes concerning for acute versus chronic, infectious versus inflammatory and lytic lesions seen in spine and ribs. As per PCP, patient immediately sent over to the emergency room for admission on 5/6 and further workup.  Seen by oncology, ENT and endocrinology. Primary thyroid malignancy felt to be somewhat unusual and lymphoma possible consideration. Patient underwent MRI which found incidentally subdural hematoma. This limited patient's ability to be a medical regulation first thrombus, however after discussion with neurosurgery and oncology, patient has been started on low-dose anticoagulation and monitored closely. Also started on IV steroids. He underwent biopsy with tissue results noting an adenocarcinoma that can be seen with either lung or thyroid consistent with metastatic disease. Patient also noted to be aspirating during barium swallow study on 5/9. Started on broad-spectrum IV antibiotics.  Patient seen today after radiation. Initially weak and did not want to work much with physical therapy yesterday. Feels like breathing is a  little bit better. Less pain throughout.  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 pain somewhat controlled. On IV steroids.  Active Problems:   Rheumatoid arthritis: Plaquenil and methotrexate on hold.    Hypertension: Continue ACE inhibitor and beta blocker.    Lung nodule   Pain of right lower extremity-Lower Back and  Legs   Weakness-Bilateral Leg and Right arm > Left arm: Physical and occupational therapy seeing.    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 Initially recommended discontinuing Lovenox. However in speaking with oncology, patient is a high clot risk given thrombus. Repeat CT scan of the head to be notes no change    Leukocytosis: Likely stress margination plus secondary to steroids.    Dysphagia: Felt to be secondary to enlarged thyroid. Patient able to speak in full sentences and seen by ear nose and throat who felt no evidence of thyroid abscess or cellulitis. After ENT recommendations, Patient underwent by interventional radiology biopsy. Pathology still pending at this time.     Poor dentition: Unable to get Panorex films. Dental consult pending.    Anemia: Likely from chronic disease. Following H&H.    Pleural effusion: Followed by pulmonary. Daily chest x-rays and if patient's pleural effusions worsen or shortness of breath declines, thoracentesis will need to be done.    Hyperthyroidism/enlarged thyroid: Questionable etiology. Thyroid studies noted suppressed TSH and elevated free T4/T3. Seen in consultation by endocrinology. Started on methimazole plus Decadron. Ultrasound noted enlarged inhomogenous thyroid gland with nonspecific calcification a single area in the right lobe with no discrete thyroid mass.    SDH (subdural hematoma): As above. Telemetry with neuro checks, repeat CT scan tomorrow. Low-dose and a  coagulation  has been restarted.   Acute respiratory failure secondary to Aspiration pneumonia caused by dysphagia: A shunt noted to aspirate during barium swallow done 5/9 and since then with worsening leukocytosis. Started on IV Zosyn and vancomycin. Breathing has since improved. Still needing supplemental oxygen.   Code Status: Full code  Family Communication: No family present. Apparently, patient's wife is in [place] right now and unable to be reached. She does not know anything about what has occurred during this hospitalization. Son is aware and trying to reach the mother  Disposition Plan: Given overall stability, we'll transfer to floor.   Consultants:  Oncology  EENT  Endocrinology  Pulmonary  Interventional radiology  Procedures:  Ultrasound-guided biopsy of left side of neck lymph nodes by interventional radiology done 5/9  Radiation treatments have started as of 5/11  Antibiotics:  IV Zosyn 5/9-present  IV vancomycin 5/10-present   Objective: BP 162/74 mmHg  Pulse 76  Temp(Src) 98.1 F (36.7 C) (Oral)  Resp 18  Ht '5\' 7"'$  (1.702 m)  Wt 62.7 kg (138 lb 3.7 oz)  BMI 21.64 kg/m2  SpO2 97%  Intake/Output Summary (Last 24 hours) at 07/10/14 1437 Last data filed at 07/10/14 1340  Gross per 24 hour  Intake 1984.92 ml  Output    825 ml  Net 1159.92 ml   Filed Weights   07/05/14 0400 07/08/14 0500 07/10/14 0421  Weight: 61 kg (134 lb 7.7 oz) 63.2 kg (139 lb 5.3 oz) 62.7 kg (138 lb 3.7 oz)    Exam: Unchanged from 5/11  General:  Alert and oriented 3, no acute distress  Cardiovascular: Regular rate and rhythm, S1-S2  Respiratory: Limited inspiratory effort, otherwise clear to auscultation bilaterally  Abdomen: Soft, nontender, nondistended with positive bowel sounds  Musculoskeletal: No clubbing or cyanosis, trace edema   Data Reviewed: Basic Metabolic Panel:  Recent Labs Lab 07/04/14 1600  07/06/14 0405 07/07/14 0401 07/08/14 0400  07/09/14 0340 07/10/14 1336  NA  --   < > 137 136 137 137 PENDING  K  --   < > 3.8 4.2 4.2 4.4 PENDING  CL  --   < > 110 110 108 107 PENDING  CO2  --   < > 19* 18* 20* 20* PENDING  GLUCOSE  --   < > 104* 122* 112* 114* 111*  BUN  --   < > 37* 31* 33* 35* 34*  CREATININE 0.56*  < > 0.47* 0.49* 0.50* 0.51* 0.36*  CALCIUM  --   < > 8.4* 8.1* 7.9* 7.8* PENDING  MG 2.2  --   --  2.5* 2.4 2.5*  --   < > = values in this interval not displayed. Liver Function Tests:  Recent Labs Lab 07/04/14 1154 07/05/14 0347 07/08/14 0400 07/09/14 0340 07/10/14 1336  AST 34 30 41 37 33  ALT 32 34 46 51 50  ALKPHOS 66 68 59 59 55  BILITOT 1.1 1.0 1.0 1.1 0.9  PROT 5.8* 5.8* 5.3* 5.3* 4.7*  ALBUMIN 3.2* 3.0* 2.7* 2.7* 2.5*   No results for input(s): LIPASE, AMYLASE in the last 168 hours. No results for input(s): AMMONIA in the last 168 hours. CBC:  Recent Labs Lab 07/04/14 1154  07/07/14 0401 07/08/14 0400 07/09/14 0340 07/10/14 0420 07/10/14 1336  WBC 20.0*  < > 20.9* 23.2* 22.9* 30.1* 29.4*  NEUTROABS 15.0*  --  17.7* 19.8*  --   --  26.0*  HGB 10.4*  < > 8.8* 9.0* 9.2* 9.3* 8.6*  HCT 31.4*  < >  26.3* 27.4* 28.3* 28.7* 26.7*  MCV 94.9  < > 94.6 94.5 94.6 95.0 96.0  PLT 162  < > 172 167 179 201 189  < > = values in this interval not displayed. Cardiac Enzymes:   No results for input(s): CKTOTAL, CKMB, CKMBINDEX, TROPONINI in the last 168 hours. BNP (last 3 results) No results for input(s): BNP in the last 8760 hours.  ProBNP (last 3 results) No results for input(s): PROBNP in the last 8760 hours.  CBG:  Recent Labs Lab 07/06/14 0755 07/07/14 0814 07/08/14 0805 07/09/14 0747 07/10/14 0818  GLUCAP 102* 106* 100* 100* 169*    Recent Results (from the past 240 hour(s))  Culture, blood (routine x 2)     Status: None   Collection Time: 07/04/14  1:54 PM  Result Value Ref Range Status   Specimen Description BLOOD LEFT ANTECUBITAL  Final   Special Requests BOTTLES DRAWN  AEROBIC AND ANAEROBIC 5 CC EA  Final   Culture   Final    NO GROWTH 5 DAYS Performed at [facility]    Report Status 07/10/2014 FINAL  Final  Culture, blood (routine x 2)     Status: None   Collection Time: 07/04/14  1:55 PM  Result Value Ref Range Status   Specimen Description BLOOD BLOOD RIGHT FOREARM  Final   Special Requests BOTTLES DRAWN AEROBIC AND ANAEROBIC 5 CC EA  Final   Culture   Final    NO GROWTH 5 DAYS Performed at [facility]    Report Status 07/10/2014 FINAL  Final  Culture, Urine     Status: None   Collection Time: 07/04/14  1:59 PM  Result Value Ref Range Status   Specimen Description URINE, RANDOM  Final   Special Requests NONE  Final   Colony Count NO GROWTH Performed at [facility]   Final   Culture NO GROWTH Performed at [facility]   Final   Report Status 07/05/2014 FINAL  Final  MRSA PCR Screening     Status: None   Collection Time: 07/04/14  2:42 PM  Result Value Ref Range Status   MRSA by PCR NEGATIVE NEGATIVE Final    Comment:        The GeneXpert MRSA Assay (FDA approved for NASAL specimens only), is one component of a comprehensive MRSA colonization surveillance program. It is not intended to diagnose MRSA infection nor to guide or monitor treatment for MRSA infections.      Studies: Dg Chest Port 1 View  07/09/2014   CLINICAL DATA:  PICC placement  EXAM: PORTABLE CHEST - 1 VIEW  COMPARISON:  07/07/2014  FINDINGS: Right arm PICC tip in the mid SVC in good position.  Right upper lobe airspace disease unchanged. Bibasilar airspace disease and bilateral effusions unchanged.  IMPRESSION: Right arm PICC tip in the SVC  Right upper lobe airspace disease may represent edema or pneumonia. Bibasilar atelectasis and effusion unchanged.   Electronically Signed   By: Franchot Gallo M.D.   On: 07/09/2014 09:13    Scheduled Meds: . acetaminophen  500 mg Oral TID  . antiseptic oral rinse  7 mL Mouth Rinse BID  .  benazepril  40 mg Oral Daily  . budesonide-formoterol  2 puff Inhalation BID  . dexamethasone  8 mg Oral Q12H  . docusate  100 mg Oral BID  . feeding supplement (ENSURE ENLIVE)  237 mL Oral BID BM  . feeding supplement (RESOURCE BREEZE)  1 Container Oral  BID BM  . guaiFENesin  1,200 mg Oral BID  . methimazole  60 mg Oral Daily  . OxyCODONE  15 mg Oral Q12H  . pantoprazole  40 mg Oral BID  . piperacillin-tazobactam (ZOSYN)  IV  3.375 g Intravenous Q8H  . propranolol  60 mg Oral QID  . sodium chloride  10-40 mL Intracatheter Q12H  . sodium chloride  3 mL Intravenous Q12H  . vancomycin  750 mg Intravenous Q8H    Continuous Infusions: . sodium chloride 50 mL/hr at 07/10/14 1250  . heparin 1,200 Units/hr (07/10/14 1122)     Time spent: 25 minutes  [facility] Pager [phone]. If 7PM-7AM, please contact night-coverage at [email or web address], password [email or web address] 07/10/2014, 2:37 PM  LOS: 6 days

## 2014-07-10 NOTE — Progress Notes (Signed)
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 He is eating cereal. He is not having any swallowing difficulties. I was wondering if his diet might be oh to be increased a little bit more. He really needs to get the calories and protein in.  He has a PICC line in. This is a triple lumen PICC line. This is in his right arm. He still has the heparin going through a peripheral IV in the left arm.  He buys another CT of the brain to see about a subdural hematomas. He'll start radiation therapy today.  His pain seems to be doing pretty well right now.  He still not really able to get up and about all that much. Hopefully, once her radiation starts, he will be able to get a little bit more active.  The final pathology still is in question. I suspect that this probably will end up being lung cancer. I spoke to the pathologist today to send the specimen off for genetic analysis for EGFR, ALK, and ROS.  He is going to the bathroom.   There has been no obvious bleeding.  He is on IV antibiotics. I'm not sure as to why he is on antibiotics. All his cultures are negative. I think his white cell count is not elevated because of steroids and also secondary to a leukemoid reaction from the underlying malignancy.  His physical exam shows blood pressure be much better. It is 145/63. Heart rate is now down to 76. Temperature 98.1.  There really is no change is overall physical.  Again, I have to suspect that this is going to be metastatic lung cancer.  My other concern is the brain. Again, I'm not sure if he has CNS metastasis. He has no focal neurological issues that would suggest a metastasis. I suspect that we may just have to follow this along. I'm not sure a spinal tap would help [place] out with CSF cytology.  Hopefully, he will be able to go upstairs to a regular floor today.    [name] E.  Psalm 16:8  He is very appreciative of all the great care that is getting from the staff down the ICU.

## 2014-07-10 NOTE — Progress Notes (Signed)
PT Cancellation Note  Patient Details Name: [name] [name] MRN: [id number] DOB: October 18, 1940   Cancelled Treatment:    Reason Eval/Treat Not Completed: Pain limiting ability to participate Pt with pain this morning and reports receiving meds however refusing mobility/PT and just now transferring out of ICU to floor.  Will check back as schedule permits.   Criss Bartles,KATHrine E 07/10/2014, 2:15 PM Carmelia Bake, PT, DPT 07/10/2014 Pager: [phone]

## 2014-07-10 NOTE — Progress Notes (Signed)
CSW continuing to follow.   CSW followed up with pt at bedside to provide support and continue discussion around disposition planning.   CSW provided supportive listening surrounding pt discussing beginning radiation treatment and his experience during simulation yesterday.   CSW discussed with pt SNF bed offers and discussed importance of choosing facility even though we know pt not yet ready for d/c in order for facility to initiate authorization with [employer] as authorization process can be timely.   Pt chooses bed at [facility]. Pt stated that he wishes to get more detailed information about how pt insurance covers SNF. CSW discussed that CSW will reach out to [facility], Gayla Medicus and ask her to get further information regarding insurance coverage in order to be able to share with pt. Pt appreciative.   Pt expressed anxiousness about knowing what time radiation treatment would be preformed today and CSW notified pt of time listed in the chart, but explained that when a pt is inpatient sometimes those times vary. Pt expressed understanding.   CSW notified [facility] of acceptance of bed offer and facility initiated insurance authorization process through [employer]. CSW spoke with [facility] liaison, [place] regarding pt wanting more information regarding coverage and she stated that she will gather information in order to provide to pt.   CSW to continue to follow to provide support and assist with pt disposition needs.   Alison Murray, MSW, [place] Work [phone]

## 2014-07-10 NOTE — Clinical Documentation Improvement (Signed)
Supporting Information: HCAP noted per 5/10 progress notes. Patient aspirated during Barium swallow of 5/10. IV zosyn started last pm, will place on IV vancomycin, mucinex, and nebulizer treatments per 5/10 progress notes. Patient at high risk for aspiration due to level of dysphagia per 5/10 Barium swallow note.   Possible Clinical Condition: . Document causative organism (if known) . Document mechanism: --Aspiration Pneumonia --Other (specify) . Document any associated illness: --Respiratory failure --Sepsis --Underlying lung disease --Other (specify) . Document history of tobacco use-present or past   Thank Sherian Maroon Documentation Specialist [phone] [email or web address]

## 2014-07-10 NOTE — Progress Notes (Addendum)
ANTIBIOTIC CONSULT NOTE - INITIAL  Pharmacy Consult for vancomycin Indication: pneumonia  Allergies  Allergen Reactions  . Other     Catfish + IV pain medication (unknown name)-vomits    Patient Measurements: Height: '5\' 7"'$  (170.2 cm) Weight: 138 lb 3.7 oz (62.7 kg) IBW/kg (Calculated) : 66.1   Vital Signs: Temp: 98.4 F (36.9 C) (05/12 0804) Temp Source: Axillary (05/12 0804) BP: 145/63 mmHg (05/12 0600) Pulse Rate: 76 (05/12 0600) Intake/Output from previous day: 05/11 0701 - 05/12 0700 In: 2004.4 [P.O.:120; I.V.:1434.4; IV Piggyback:450] Out: 825 [Urine:325; Stool:500] Intake/Output from this shift:    Labs:  Recent Labs  07/08/14 0400 07/09/14 0340 07/10/14 0420  WBC 23.2* 22.9* 30.1*  HGB 9.0* 9.2* 9.3*  PLT 167 179 201  CREATININE 0.50* 0.51*  --    Estimated Creatinine Clearance: 75.1 mL/min (by C-G formula based on Cr of 0.51).  Recent Labs  07/10/14 0846  [place] 8*     Microbiology: Recent Results (from the past 720 hour(s))  Culture, blood (routine x 2)     Status: None   Collection Time: 07/04/14  1:54 PM  Result Value Ref Range Status   Specimen Description BLOOD LEFT ANTECUBITAL  Final   Special Requests BOTTLES DRAWN AEROBIC AND ANAEROBIC 5 CC EA  Final   Culture   Final    NO GROWTH 5 DAYS Performed at [facility]    Report Status 07/10/2014 FINAL  Final  Culture, blood (routine x 2)     Status: None   Collection Time: 07/04/14  1:55 PM  Result Value Ref Range Status   Specimen Description BLOOD BLOOD RIGHT FOREARM  Final   Special Requests BOTTLES DRAWN AEROBIC AND ANAEROBIC 5 CC EA  Final   Culture   Final    NO GROWTH 5 DAYS Performed at [facility]    Report Status 07/10/2014 FINAL  Final  Culture, Urine     Status: None   Collection Time: 07/04/14  1:59 PM  Result Value Ref Range Status   Specimen Description URINE, RANDOM  Final   Special Requests NONE  Final   Colony Count NO GROWTH Performed at  [facility]   Final   Culture NO GROWTH Performed at [facility]   Final   Report Status 07/05/2014 FINAL  Final  MRSA PCR Screening     Status: None   Collection Time: 07/04/14  2:42 PM  Result Value Ref Range Status   MRSA by PCR NEGATIVE NEGATIVE Final    Comment:        The GeneXpert MRSA Assay (FDA approved for NASAL specimens only), is one component of a comprehensive MRSA colonization surveillance program. It is not intended to diagnose MRSA infection nor to guide or monitor treatment for MRSA infections.     Medical History: Past Medical History  Diagnosis Date  . Lung nodule 2012  . Insomnia     takes Restoril nightly as needed  . Hypertension     takes Benazepril and Metoprolol daily  . Pneumonia     hx of;many yrs ago  . AAA (abdominal aortic aneurysm)   . Joint pain   . Joint swelling   . Urinary frequency     takes Terazosin daily  . Urinary urgency   . Nocturia   . Enlarged prostate     slightly  . 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  CXR findings on 5/9 were consistent with PNA.  Zosyn and vancomycin started for infection.  5/9 >>zosyn  >> 5/9 >> vanc>>  5/6 ucx: neg FINAL 5/6 bcx x2: neg FINAL  5/9 CXR: Left lower lobe airspace consolidation. BL pleural effusions and interstitial edema consistent with pneumonia.  Dose changes/levels: 5/12 VT 8 (on 750 mg q12h)  Today, 07/10/14:  - afeb - wbc up 30.1 (on steroids) - scr stable 0.51 (crcl~75)   Goal of Therapy:  Vancomycin trough level 15-20 mcg/ml  Plan:  - Increase vancomycin dose to 750 mg IV q8h - continue zosyn 3.375 gm q8h (over4 hours) - f/u cultures, renal function, and clinical status  Sultan Pargas P 07/10/2014,9:55 AM

## 2014-07-10 NOTE — Clinical Social Work Placement (Signed)
   CLINICAL SOCIAL WORK PLACEMENT  NOTE  Date:  07/10/2014  Patient Details  Name: [name] [name] MRN: [id number] Date of Birth: 13-Nov-1940  Clinical Social Work is seeking post-discharge placement for this patient at the [place] level of care (*CSW will initial, date and re-position this form in  chart as items are completed):  Yes   Patient/family provided with [place] Work Department's list of facilities offering this level of care within the geographic area requested by the patient (or if unable, by the patient's family).  Yes   Patient/family informed of their freedom to choose among providers that offer the needed level of care, that participate in Medicare, Medicaid or managed care program needed by the patient, have an available bed and are willing to accept the patient.  Yes   Patient/family informed of [facility] ownership interest in [facility] and [facility], as well as of the fact that they are under no obligation to receive care at these facilities.  PASRR submitted to EDS on 07/08/14     PASRR number received on 07/08/14     Existing PASRR number confirmed on       FL2 transmitted to all facilities in geographic area requested by pt/family on 07/08/14     FL2 transmitted to all facilities within larger geographic area on       Patient informed that his/her managed care company has contracts with or will negotiate with certain facilities, including the following:        Yes   Patient/family informed of bed offers received.  Patient chooses bed at [facility]     Physician recommends and patient chooses bed at      Patient to be transferred to   on  .  Patient to be transferred to facility by       Patient family notified on   of transfer.  Name of family member notified:        PHYSICIAN Please sign FL2     Additional Comment:    _______________________________________________ Ladell Pier, LCSW 07/10/2014, 10:16 AM

## 2014-07-10 NOTE — Progress Notes (Signed)
SLP Cancellation Note  Patient Details Name: [name] [name] MRN: [id number] DOB: 25-Apr-1940   Cancelled treatment:       Reason Eval/Treat Not Completed:  (per slp discussion with [facility] ICU RN, pt tolerating po he is consuming.  given pt on diet without expectorating and he is tolerating, SLP to sign off.  please reorder if desire.)   Luanna Salk, [place] [facility] SLP [phone]

## 2014-07-10 NOTE — Progress Notes (Signed)
Asked to repeat US guided lymph node biopsy of left SCLN.  It is + for metastatic carcinoma and another sample is needed for genetic testing.  Pt is otherwise doing well.  No complaints.  CBC    Component Value Date/Time   WBC 29.4* 07/10/2014 1336   RBC 2.78* 07/10/2014 1336   RBC 3.20* 07/04/2014 1600   HGB 8.6* 07/10/2014 1336   HCT 26.7* 07/10/2014 1336   PLT 189 07/10/2014 1336   MCV 96.0 07/10/2014 1336   MCH 30.9 07/10/2014 1336   MCHC 32.2 07/10/2014 1336   RDW 14.4 07/10/2014 1336   LYMPHSABS 1.2 07/10/2014 1336   MONOABS 1.7* 07/10/2014 1336   EOSABS 0.4 07/10/2014 1336   BASOSABS 0.0 07/10/2014 1336   BP 162/74 mmHg  Pulse 76  Temp(Src) 98.1 F (36.7 C) (Oral)  Resp 18  Ht '5\' 7"'$  (1.702 m)  Wt 138 lb 3.7 oz (62.7 kg)  BMI 21.64 kg/m2  SpO2 97%   A&O x 3 NAD Lungs Clear Bilaterally Heart RRR Abdomen Soft, mildly tender to palpation.  + BS Neck supple, bandaid where previous biopsy  Done   Assessment:  Enlarged lymph node, recent Bx + For metastatic CA.  Need for more tissue sample for Genetic Testing  Plan: Will plan for  US guided SCLN bx tomorrow by Dr.Watts. Risks and Benefits discussed with the patient including, but not limited to bleeding, infection, damage to adjacent structures or low yield requiring additional tests. All of the patient's questions were answered, patient is agreeable to proceed. Consent signed and in chart.

## 2014-07-11 ENCOUNTER — Telehealth: Payer: Self-pay | Admitting: Oncology

## 2014-07-11 ENCOUNTER — Other Ambulatory Visit: Payer: Self-pay | Admitting: Radiation Oncology

## 2014-07-11 ENCOUNTER — Inpatient Hospital Stay (HOSPITAL_COMMUNITY): Payer: BC Managed Care – PPO

## 2014-07-11 ENCOUNTER — Ambulatory Visit
Admit: 2014-07-11 | Discharge: 2014-07-11 | Disposition: A | Discharge status: Home / Self Care | Payer: BC Managed Care – PPO | Attending: Radiation Oncology | Admitting: Radiation Oncology

## 2014-07-11 ENCOUNTER — Encounter: Payer: Self-pay | Admitting: Family

## 2014-07-11 LAB — CBC
HCT: 27.9 % — ABNORMAL LOW (ref 39.0–52.0)
HEMOGLOBIN: 9.1 g/dL — AB (ref 13.0–17.0)
MCH: 31.3 pg (ref 26.0–34.0)
MCHC: 32.6 g/dL (ref 30.0–36.0)
MCV: 95.9 fL (ref 78.0–100.0)
Platelets: 195 10*3/uL (ref 150–400)
RBC: 2.91 MIL/uL — ABNORMAL LOW (ref 4.22–5.81)
RDW: 14.3 % (ref 11.5–15.5)
WBC: 27.3 10*3/uL — AB (ref 4.0–10.5)

## 2014-07-11 LAB — CREATININE, SERUM
CREATININE: 0.42 mg/dL — AB (ref 0.61–1.24)
GFR calc Af Amer: >60 mL/min (ref >60)
GFR calc non Af Amer: >60 mL/min (ref >60)

## 2014-07-11 LAB — GLUCOSE, CAPILLARY: Glucose-Capillary: 108 mg/dL — ABNORMAL HIGH (ref 65–99)

## 2014-07-11 LAB — HEPARIN LEVEL (UNFRACTIONATED): Heparin Unfractionated: 0.27 IU/mL — ABNORMAL LOW (ref 0.30–0.70)

## 2014-07-11 MED ORDER — MIDAZOLAM HCL 2 MG/2ML IJ SOLN
INTRAMUSCULAR | Status: AC | PRN
  Administered 2014-07-11: 1 mg via INTRAVENOUS

## 2014-07-11 MED ORDER — MIDAZOLAM HCL 2 MG/2ML IJ SOLN
INTRAMUSCULAR | Status: AC
  Filled 2014-07-11: qty 4

## 2014-07-11 MED ORDER — HEPARIN (PORCINE) IN NACL 100-0.45 UNIT/ML-% IJ SOLN
1300.0000 [IU]/h | INTRAMUSCULAR | Status: DC
  Administered 2014-07-11: 1300 [IU]/h via INTRAVENOUS
  Filled 2014-07-11 (×3): qty 250

## 2014-07-11 MED ORDER — FUROSEMIDE 10 MG/ML IJ SOLN
40.0000 mg | Freq: Once | INTRAMUSCULAR | Status: AC
  Administered 2014-07-11: 40 mg via INTRAVENOUS
  Filled 2014-07-11: qty 4

## 2014-07-11 MED ORDER — FENTANYL CITRATE (PF) 100 MCG/2ML IJ SOLN
INTRAMUSCULAR | Status: DC
Start: 2014-07-11 — End: 2014-07-11
  Filled 2014-07-11: qty 2

## 2014-07-11 NOTE — Progress Notes (Signed)
     Weekly Management Note:  Inpatient  Current Dose:  6 Gy to spine and neck Projected Dose: 30 Gy   He has also completed 8 Gy to his ribs bilaterally and right hip  Narrative:  The patient presents for routine under treatment assessment.  CBCT/MVCT images/Port film x-rays were reviewed.  The chart was checked.    Patient reported complete numbness in his right leg, new this AM.    Physical Findings:  height is '5\' 7"'$  (1.702 m) and weight is 138 lb 3.7 oz (62.7 kg). His oral temperature is 98.2 F (36.8 C). His blood pressure is 165/63 and his pulse is 68. His respiration is 18 and oxygen saturation is 97%.   Wt Readings from Last 3 Encounters:  07/10/14 138 lb 3.7 oz (62.7 kg)  01/14/14 147 lb 3.2 oz (66.769 kg)  12/11/13 147 lb 4.3 oz (66.8 kg)    Impression:  The patient is tolerating radiotherapy.    Plan:  Continue radiotherapy as planned. Patient reported complete numbness in his right leg, new this AM.  I spoke with Dr. Doyle Askew about trying to get an MRI of the L spine and the Sacrum and right hip to verify the status of his disease. Bone disease doesn't show well on Bone scan or CT images.  If this shows significant nerve compression in areas outside of my fields to explain his right leg numbness (I am treating L2 to S1 currently) then my partner may decide to adjust his volumes next week while I am out of the office.  I will let Dr Valere Dross know, as he will be evaluating the patient on Monday May 16.  ________________________________   Eppie Gibson, M.D.

## 2014-07-11 NOTE — Progress Notes (Signed)
OT Cancellation Note  Patient Details Name: [name] [name] MRN: [id number] DOB: 29-Jan-1941   Cancelled Treatment:    Reason Eval/Treat Not Completed: Other (comment).  Pt had a very busy day and is sleeping and will need to return to XRT later this afternoon.  Will check back next week.  Akaiya Touchette 07/11/2014, 2:56 PM  Lesle Chris, OTR/L [phone] 07/11/2014

## 2014-07-11 NOTE — Progress Notes (Signed)
Patient ID: [name] [name], male   DOB: 1940/04/20, 74 y.o.   MRN: [id number]  [facility] PROGRESS NOTE  LENARDO WESTWOOD NKN:[id number] DOB: [birth year] DOA: 07/04/2014 PCP: [name], MD   Brief narrative:    74 year old male with rheumatoid arthritis and hypertension, presented to [facility] ED with main concern of 3 weeks duration of bilateral chest pain and neck swelling, dyspnea with exertion and at rest, dysphagia and odynophagia, hemoptysis. PCP requested CT scan which was notable for massive thyroid gland with adjacent phlegmon, L>R, necrotic cervical lymphadenopathy c/w metastatic disease and possible acute suppurative thyroiditis. CT chest concerning for acute versus chronic, infectious versus inflammatory process, lytic lesions seen in spine and ribs.   Seen by oncology, ENT and endocrinology. Primary thyroid malignancy felt to be somewhat unusual and lymphoma possible consideration. Patient underwent MRI which found incidentally subdural hematoma. Patient has been started on low-dose anticoagulation, also started on IV steroids. He underwent biopsy with tissue results noting an adenocarcinoma that can be seen with either lung or thyroid consistent with metastatic disease. Patient also noted to be aspirating during barium swallow study on 5/9. Started on broad-spectrum IV antibiotics.  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 pain somewhat controlled - continue IV steroids - radiation treatment started May 11th, 2016   Acute cancer related pain - in multiple sites throughout the body: upper and lower back area, bilateral LE's and bilateral UE's - continue analgesia as needed   Lower extremity numbness and weakness, acute and worse 3/79 - certainly worrisome for lower back cord compression - d/w Dr. Isidore Moos, plan to proceed with radiation therapy to decrease the change of progressive  neurological dysfunction - will also need MRI of the lumbar and sacral spine, including bilateral hips but can be done in AM or Monday per Dr. Isidore Moos   Active Problems:  Rheumatoid arthritis - Plaquenil and methotrexate on hold.    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 Maryland Pink - patient has a high clot risk given thrombus - started low dose AC, monitoring with daily CBC    Metabolic acidosis with hypokalemia  - provide IVF, supplement K, repeat BMP In AM   Leukocytosis - ikely stress margination plus secondary to steroids. - slowly trending down    Dysphagia - Felt to be secondary to enlarged thyroid - pathology still pending at this time.   Poor dentition - Dental consult pending.   Anemia of chronic disease, malignancy - HG remains stable with no signs of bleeding - repeat CBC in AM   Acute respiratory failure secondary to pleural effusion, dysphagia, aspiration PNA - pleural effusion, ? Malignant - pt started on Vanc and Zosyn IV, will continue same regimen as respiratory status has been improving with ABX - may need thoracentesis, depending on the respiratory state - pt still requiring oxygen via [place]   Hyperthyroidism/enlarged thyroid - Questionable etiology. Thyroid studies noted suppressed TSH and elevated free T4/T3 - started on methimazole plus Decadron per endocrinologist - [place] noted enlarged inhomogenous thyroid gland with calcification a single area in right lobe, no discrete thyroid mass.   SDH (subdural hematoma) - low dose AC as noted above    Severe  PCM - in the context of acute illness - still very poor oral intake - nutritionist to be consulted    Code Status: Full code  Family Communication:  Discussed with son at bedside at length, understand guarded prognosis and further plan in management. Waiting for pt's wife to continue our goals of care discussion. Emotional support provided to son and pt, both appreciated our time and concern. I encouraged family to ask questions with no hesitation. Also explained that we can provide interpreter for better communication if needed.   Disposition Plan: Barrier to discharge - pt still on IV ABX, cancer work up still in progress, given worsening LE weakness, cord compression must be ruled out but radiation therapy needs to be done today at the expense of doing an MRI. D/W Dr. Isidore Moos, we can proceed with MRI likely in AM.  IV access:  Peripheral IV  Procedures and diagnostic studies:    Dg Chest 2 View  07/06/2014  Stable bilateral pleural effusions and associated atelectasis. 2. Stable changes of COPD and borderline cardiomegaly.     Ct Head Wo Contrast  07/10/2014  Stable appearance of posterior and medial subdural hematoma is. 2. Stable appearance of anterior left frontal extra-axial fluid. This may represent hematoma or less likely tumor. 3. Stable appearance of infiltrative disease along the posterior medial left frontal lobe near the vertex.     Ct Head Wo Contrast   07/07/2014  Mild diffuse cortical atrophy. Mild chronic ischemic white matter disease.  Left frontal and posterior fossa subdural hematomas are noted as described on prior MRI.  High density material is noted in left parafalcine region for the vertex which may represent hemorrhage or leptomeningeal neoplastic disease as described on prior MRI.     Ct Soft Tissue Neck W Contrast  07/03/2014  Massive thyroid gland enlargement with adjacent phlegmon. LEFT-greater-than- RIGHT necrotic cervical lymphadenopathy. 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 Acute suppurative  thyroiditis is in the differential considerations. 2. 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 3. Markedly carious dentition with osteolysis in the RIGHT mandibular body. This may be odontogenic or metastatic.     Ct Chest Wo Contrast  07/03/2014  There are coarse reticular opacities with intervening hazy ground-glass opacity and associated if edematous cystic change in the right upper lobe. This could all be chronic. Acute infectious or inflammatory infiltrate is possible. 2. No discrete pulmonary mass. 4 mm pleural-based nodule noted in the peripheral right upper lobe. No other suspicious nodules 3. Small, right greater than left, pleural effusions with associated dependent atelectasis. No pulmonary edema. 4. Neck base abnormalities. Patient also underwent neck CT. There is ill-defined soft tissue that involves and surrounds the thyroid gland. The thyroid appears enlarged and heterogeneous. There are multiple prominent neck base lymph nodes. 5. There are lytic lesions involving 3 discrete ribs. The left fifth rib also has associated soft tissue mass. This is highly suspicious for metastatic disease to bone.    Mr Jeri Cos Wo Contrast  07/06/2014  Left side subdural hematomas in both the posterior fossa and along the left hemisphere, appear to be new since the neck CT on 07/03/2014. 2. Abnormal appearance of a 3-4 cm area of the pachy- and leptomeninges at the left vertex over the left superior frontal gyrus. Given the possibility of metastatic thyroid carcinoma in this patient, this constellation may  reflect a dural metastasis from thyroid cancer, and thyroid metastases in the brain are prone to hemorrhage - which might explain #1. In the absence of a known malignancy, the differential diagnosis of this dural abnormality would include a dural vascular malformation. 3. No cerebral or cerebellar edema. No significant intracranial mass effect at this time.   Mr Lumbar Spine  Wo Contrast  07/04/2014  Findings consistent with multifocal metastatic disease. 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 Most notably affected levels are from L3 to L5-S1 level.  Findings were discussed with Dr. Carmin Muskrat at the time of interpretation.     Nm Bone Scan Whole Body  07/08/2014  Abnormal increased bilateral rib activity compatible with metastatic disease. No other suspicious bony abnormalities identified.  Focus of increased activity within the lateral soft tissues of the upper left arm -indeterminate. Correlate clinically.   US Soft Tissue Head/neck  07/06/2014  Enlarged inhomogeneous thyroid gland with a single nonspecific calcification in the RIGHT lobe.  No discrete thyroid mass identified.  Scattered cervical lymph nodes normal to mildly enlarged in sizes.   Ct Abdomen Pelvis W Contrast  07/05/2014 No acute finding within the abdomen pelvis. 2. No extraosseous metastatic disease or evidence of a primary neoplasm. 3. 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 The visible vertebrae show a subtle heterogeneous pattern without a discrete osteoblastic or osteolytic lesion. 4. Moderate right and small to moderate left pleural effusions with significant associated lower lobe atelectasis. No lung base nodules.     Dg Esophagus  07/07/2014  Suboptimal exam due to immediate aspiration, which elicited a cough reflex. No esophageal mass or stricture identified.     US Biopsy  07/11/2014   Technically successful ultrasound guided right cervical lymph node biopsy for additional tissue for molecular testing.    US Biopsy   07/07/2014  Ultrasound survey demonstrates multiple abnormal nodes along the left cervical chain.  Images during the case demonstrate needle placement within the nodes on each pass.  Final image demonstrates  no complicating features.  IMPRESSION: Status post ultrasound-guided biopsy of left cervical lymph nodes. Specimen sent to pathology for complete histopathologic analysis.    Dg Chest Port 1 View  07/09/2014  Right arm PICC tip in the SVC  Right upper lobe airspace disease may represent edema or pneumonia. Bibasilar atelectasis and effusion unchanged.   Dg Chest Port 1 View  07/07/2014  Left lower lobe airspace consolidation.. Bilateral pleural effusions and interstitial edema consistent with pneumonia.    Medical Consultants:    Oncology  ENT  Endocrinology  Pulmonary  Interventional radiology  IAnti-Infectives:    IV Zosyn 5/9-present  IV vancomycin 5/10-present  Faye Ramsay, MD  [facility] Pager [phone]  If 7PM-7AM, please contact night-coverage [email or web address] Password [email or web address] 07/11/2014, 6:09 PM   LOS: 7 days   HPI/Subjective: No events overnight. Still with LE weakness, now worsening numbness and tingling.   Objective: Filed Vitals:   07/11/14 1037 07/11/14 1042 07/11/14 1048 07/11/14 1452  BP: 185/84 177/79 178/78 165/63  Pulse: 76 74 73 68  Temp:    98.2 F (36.8 C)  TempSrc:    Oral  Resp: '17 16 16 18  '$ Height:      Weight:      SpO2: 96% 97% 96% 97%    Intake/Output Summary (Last 24 hours) at 07/11/14 1809  Last data filed at 07/11/14 1334  Gross per 24 hour  Intake   1200 ml  Output    250 ml  Net    950 ml    Exam:   General:  Pt is alert, follows commands appropriately, not in acute distress  Cardiovascular: Regular rate and rhythm,  no rubs, no gallops  Respiratory: Clear to auscultation bilaterally, diminished breath sounds at bases with crackles   Abdomen: Soft, non tender, non distended, bowel sounds present, no guarding  Extremities: pulses DP and PT palpable bilaterally  Data Reviewed: Basic Metabolic Panel:  Recent Labs Lab 07/06/14 0405 07/07/14 0401 07/08/14 0400 07/09/14 0340 07/10/14 1336 07/11/14 0530  NA 137 136 137  137 136  --   K 3.8 4.2 4.2 4.4 3.3*  --   CL 110 110 108 107 115*  --   CO2 19* 18* 20* 20* 18*  --   GLUCOSE 104* 122* 112* 114* 111*  --   BUN 37* 31* 33* 35* 34*  --   CREATININE 0.47* 0.49* 0.50* 0.51* 0.36* 0.42*  CALCIUM 8.4* 8.1* 7.9* 7.8* 6.7*  --   MG  --  2.5* 2.4 2.5*  --   --    Liver Function Tests:  Recent Labs Lab 07/05/14 0347 07/08/14 0400 07/09/14 0340 07/10/14 1336  AST 30 41 37 33  ALT 34 46 51 50  ALKPHOS 68 59 59 55  BILITOT 1.0 1.0 1.1 0.9  PROT 5.8* 5.3* 5.3* 4.7*  ALBUMIN 3.0* 2.7* 2.7* 2.5*   CBC:  Recent Labs Lab 07/07/14 0401 07/08/14 0400 07/09/14 0340 07/10/14 0420 07/10/14 1336 07/11/14 0530  WBC 20.9* 23.2* 22.9* 30.1* 29.4* 27.3*  NEUTROABS 17.7* 19.8*  --   --  26.0*  --   HGB 8.8* 9.0* 9.2* 9.3* 8.6* 9.1*  HCT 26.3* 27.4* 28.3* 28.7* 26.7* 27.9*  MCV 94.6 94.5 94.6 95.0 96.0 95.9  PLT 172 167 179 201 189 195   CBG:  Recent Labs Lab 07/07/14 0814 07/08/14 0805 07/09/14 0747 07/10/14 0818 07/11/14 0751  GLUCAP 106* 100* 100* 169* 108*    Recent Results (from the past 240 hour(s))  Culture, blood (routine x 2)     Status: None   Collection Time: 07/04/14  1:54 PM  Result Value Ref Range Status   Specimen Description BLOOD LEFT ANTECUBITAL  Final   Special Requests BOTTLES DRAWN AEROBIC AND ANAEROBIC 5 CC EA  Final   Culture   Final    NO GROWTH 5 DAYS Performed at [facility]    Report Status 07/10/2014 FINAL  Final  Culture, blood (routine x 2)     Status: None   Collection Time: 07/04/14  1:55 PM  Result Value Ref Range Status   Specimen Description BLOOD BLOOD RIGHT FOREARM  Final   Special Requests BOTTLES DRAWN AEROBIC AND ANAEROBIC 5 CC EA  Final   Culture   Final    NO GROWTH 5 DAYS Performed at [facility]    Report Status 07/10/2014 FINAL  Final  Culture, Urine     Status: None   Collection Time: 07/04/14  1:59 PM  Result Value Ref Range Status   Specimen Description URINE, RANDOM   Final   Special Requests NONE  Final   Colony Count NO GROWTH Performed at [facility]   Final   Culture NO GROWTH Performed at [facility]   Final   Report Status 07/05/2014 FINAL  Final  MRSA PCR Screening  Status: None   Collection Time: 07/04/14  2:42 PM  Result Value Ref Range Status   MRSA by PCR NEGATIVE NEGATIVE Final    Comment:        The GeneXpert MRSA Assay (FDA approved for NASAL specimens only), is one component of a comprehensive MRSA colonization surveillance program. It is not intended to diagnose MRSA infection nor to guide or monitor treatment for MRSA infections.      Scheduled Meds: . acetaminophen  500 mg Oral TID  . antiseptic oral rinse  7 mL Mouth Rinse BID  . benazepril  40 mg Oral Daily  . budesonide-formoterol  2 puff Inhalation BID  . dexamethasone  8 mg Oral Q12H  . docusate  100 mg Oral BID  . feeding supplement (ENSURE ENLIVE)  237 mL Oral BID BM  . feeding supplement (RESOURCE BREEZE)  1 Container Oral BID BM  . guaiFENesin  1,200 mg Oral BID  . methimazole  60 mg Oral Daily  . midazolam      . OxyCODONE  15 mg Oral Q12H  . pantoprazole  40 mg Oral BID  . piperacillin-tazobactam (ZOSYN)  IV  3.375 g Intravenous Q8H  . propranolol  60 mg Oral QID  . sodium chloride  10-40 mL Intracatheter Q12H  . sodium chloride  3 mL Intravenous Q12H  . vancomycin  750 mg Intravenous Q8H   Continuous Infusions: . sodium chloride 30 mL/hr at 07/11/14 1333  . heparin 1,300 Units/hr (07/11/14 1334)

## 2014-07-11 NOTE — Progress Notes (Signed)
ANTICOAGULATION CONSULT NOTE - Follow Up  Pharmacy Consult for heparin Indication: Left subclavian vein and left brachiocephalic vein thrombus  Assessment: 55 yoM with metastatic cancer and new left subclavian vein and left brachiocephalic vein thrombosis on heparin infusion.  See pharmacist note from 5/12 for further detail.  Heparin off this morning for ~ 4 hours for biopsy,  Resumed afterwards then IV came out while pt was in radiation.  When pt returned to floor, new IV obtained and restarted heparin.  Goal of Therapy:  Heparin level 0.3-0.5 units/ml (will aim for lower end of usual heparin goal range of 0.3-0.7 d/t subdural hematoma) Monitor platelets by anticoagulation protocol: Yes   Plan:  - continue heparin drip at 1300 units/hr - heparin level @ [place] 07/11/2014, 1:25 PM Pager [phone]

## 2014-07-11 NOTE — Progress Notes (Signed)
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 Other     Catfish + IV pain medication (unknown name)-vomits    Patient Measurements: Height: '5\' 7"'$  (170.2 cm) Weight: 138 lb 3.7 oz (62.7 kg) IBW/kg (Calculated) : 66.1 Heparin Dosing Weight:   Vital Signs: Temp: 98.2 F (36.8 C) (05/13 0525) Temp Source: Oral (05/13 0525) BP: 171/78 mmHg (05/13 0525) Pulse Rate: 66 (05/13 0525)  Labs:  Recent Labs  07/09/14 0340  07/10/14 0420 07/10/14 0846 07/10/14 1336 07/10/14 1855 07/11/14 0530  HGB 9.2*  --  9.3*  --  8.6*  --  9.1*  HCT 28.3*  --  28.7*  --  26.7*  --  27.9*  PLT 179  --  201  --  189  --  195  HEPARINUNFRC 0.11*  < >  --  0.33  --  0.34 0.27*  CREATININE 0.51*  --   --   --  0.36*  --  0.42*  < > = values in this interval not displayed.  Estimated Creatinine Clearance: 75.1 mL/min (by C-G formula based on Cr of 0.42).   Medications:  Infusions:  . sodium chloride 50 mL/hr at 07/10/14 1250  . heparin      Assessment: Patient with heparin level below goal.  No heparin issues per RN.  Goal of Therapy:  Heparin level 0.3-0.7 units/ml Monitor platelets by anticoagulation protocol: Yes   Plan:  Increase heparin to 1300 units/hr Recheck level at [address], [place] 07/11/2014,6:37 AM

## 2014-07-11 NOTE — Progress Notes (Signed)
PT Cancellation Note  Patient Details Name: CAELEN REIERSON MRN: [id number] DOB: [birth year]   Cancelled Treatment:     Biopsy am                                            Radiation pm               Will check back as schedule permits.     Nathanial Rancher 07/11/2014, 1:38 PM

## 2014-07-11 NOTE — Procedures (Signed)
Technically successful US guided biopsy of right cervical lymph node.  No immediate complications.

## 2014-07-11 NOTE — Telephone Encounter (Signed)
[name] Huh, RN on 5E.  Advised her that Mr. Lalor had another radiation treatment scheduled for 4:35 this afternoon.  Advised her that Mr. Baines will need to be premedicated for pain before he comes down.  Abigail Butts verbalized agreement.

## 2014-07-11 NOTE — Progress Notes (Signed)
Biopsy site to right neck continues to be WNL. Bandaid intact.

## 2014-07-11 NOTE — Progress Notes (Signed)
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 he has some facemask oxygen on right now. A chest x-ray done yesterday shows some infiltrate in the right upper lobe. He does have quite a bit of fluid in. I wonder this may not be some volume overload. Upon I will give him some Lasix to try to decrease his fluid burden.  The pathologist does not have enough specimen to be able to run genetic studies that we need. 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  His thyroid is definitely less prominent. I suspect that the hyperthyroid medications are decreasing the thyroid function.  He seems to be eating pretty well. Hopefully he can go on to a regular diet.  He still is on heparin. The CT scan was brain did not show any change with these subdural hematomas. As such, I probably would try to get him onto Lovenox and get the IV out of his left hand.  He still has the weakness in his right thigh. I'm sure this is from metastatic disease to his spine. He started radiation therapy. It will take a couple weeks or so before we know if the radiation is going to be effective. Hopefully, physical therapy can work with him.  His labs really look about the same. He has the leukocytosis which, again, I think is from the Decadron and also from his underlying malignancy.  His physical exam and vital signs look relatively stable. His blood pressure is 171/78. Temperature 98.2. His lungs sound pretty clear. No wheezing is noted. Cardiac exam regular rate and rhythm. His neck exam shows decrease in his thyroid mass. There is still some fullness over on the left side. Abdomen is soft. Has good bowel sounds. There is no fluid wave. There is no palpable liver or spleen tip. Extremities shows some weakness in the right thigh. The range of motion seems to be pretty good. Neurological exam is unchanged.  Upon I will try some Lasix on him given his volume excess. He certainly does not need a lot of IV fluids. He seems to be eating better so I don't  think that he needs a lot of IV.  His radiation will continue. This will be the primary mode of therapy for right now.  He will have his biopsy done so that we can send off genetic markers to see if we might be oh to use some oral therapy.  We will switch his heparin to Lovenox but do that after he has his biopsy done.  He definitely is not ready for discharge by any stretch of the imagination.  I will continue pray for him.  [name] E.  Hebrews 12:12

## 2014-07-11 NOTE — Progress Notes (Signed)
Called back to [facility] because patient's IV tubing had come loose.  Noticed that it was vancomycin and heparin infusing through the same line.  Called Abigail Butts and 5  E and advised her that line will be placed on hold until he returns to the floor.  During treatment, patient had a large, loose bowel movement.  Cleaned him and changed his bedding.  Patient mentioned that his right leg is numb.  He said it started when he received the radiation.  Dr. Isidore Moos notified.  Called Abigail Butts on 5 E and notified her of the bowel movement.

## 2014-07-12 LAB — VANCOMYCIN, TROUGH: Vancomycin Tr: 14 ug/mL (ref 10.0–20.0)

## 2014-07-12 LAB — CBC
HCT: 27.3 % — ABNORMAL LOW (ref 39.0–52.0)
Hemoglobin: 9 g/dL — ABNORMAL LOW (ref 13.0–17.0)
MCH: 31.4 pg (ref 26.0–34.0)
MCHC: 33 g/dL (ref 30.0–36.0)
MCV: 95.1 fL (ref 78.0–100.0)
PLATELETS: 180 10*3/uL (ref 150–400)
RBC: 2.87 MIL/uL — ABNORMAL LOW (ref 4.22–5.81)
RDW: 14.5 % (ref 11.5–15.5)
WBC: 26 10*3/uL — ABNORMAL HIGH (ref 4.0–10.5)

## 2014-07-12 LAB — BASIC METABOLIC PANEL
Anion gap: 7 (ref 5–15)
BUN: 31 mg/dL — ABNORMAL HIGH (ref 6–20)
CALCIUM: 7.2 mg/dL — AB (ref 8.9–10.3)
CHLORIDE: 107 mmol/L (ref 101–111)
CO2: 24 mmol/L (ref 22–32)
Creatinine, Ser: 0.47 mg/dL — ABNORMAL LOW (ref 0.61–1.24)
GFR calc non Af Amer: >60 mL/min (ref >60)
Glucose, Bld: 117 mg/dL — ABNORMAL HIGH (ref 65–99)
Potassium: 3.7 mmol/L (ref 3.5–5.1)
SODIUM: 138 mmol/L (ref 135–145)

## 2014-07-12 LAB — HEPARIN LEVEL (UNFRACTIONATED)
HEPARIN UNFRACTIONATED: 0.45 [IU]/mL (ref 0.30–0.70)
Heparin Unfractionated: 0.42 IU/mL (ref 0.30–0.70)
Heparin Unfractionated: 0.53 IU/mL (ref 0.30–0.70)

## 2014-07-12 LAB — GLUCOSE, CAPILLARY: GLUCOSE-CAPILLARY: 118 mg/dL — AB (ref 65–99)

## 2014-07-12 MED ORDER — HEPARIN (PORCINE) IN NACL 100-0.45 UNIT/ML-% IJ SOLN
1250.0000 [IU]/h | INTRAMUSCULAR | Status: DC
  Administered 2014-07-12 – 2014-07-13 (×2): 1250 [IU]/h via INTRAVENOUS
  Filled 2014-07-12 (×4): qty 250

## 2014-07-12 MED ORDER — TRAMADOL HCL 50 MG PO TABS
50.0000 mg | ORAL_TABLET | Freq: Four times a day (QID) | ORAL | Status: DC | PRN
  Administered 2014-07-12 – 2014-07-18 (×11): 50 mg via ORAL
  Filled 2014-07-12 (×11): qty 1

## 2014-07-12 NOTE — Progress Notes (Signed)
ANTICOAGULATION CONSULT NOTE - Follow Up  Pharmacy Consult for heparin Indication: Left subclavian vein and left brachiocephalic vein thrombus  Allergies  Allergen Reactions  . Other     Catfish + IV pain medication (unknown name)-vomits    Patient Measurements: Height: '5\' 7"'$  (170.2 cm) Weight: 138 lb 3.7 oz (62.7 kg) IBW/kg (Calculated) : 66.1 Heparin Dosing Weight: 63 kg  Vital Signs: Temp: 97.6 F (36.4 C) (05/14 1441) Temp Source: Oral (05/14 1441) BP: 185/71 mmHg (05/14 1441) Pulse Rate: 65 (05/14 1441)  Labs:  Recent Labs  07/10/14 1336  07/11/14 0530 07/11/14 2354 07/12/14 0540 07/12/14 1550  HGB 8.6*  --  9.1*  --  9.0*  --   HCT 26.7*  --  27.9*  --  27.3*  --   PLT 189  --  195  --  180  --   HEPARINUNFRC  --   < > 0.27* 0.42 0.53 0.45  CREATININE 0.36*  --  0.42*  --  0.47*  --   < > = values in this interval not displayed.  Estimated Creatinine Clearance: 75.1 mL/min (by C-G formula based on Cr of 0.47).   Medical History: Past Medical History  Diagnosis Date  . Lung nodule 2012  . Insomnia     takes Restoril nightly as needed  . Hypertension     takes Benazepril and Metoprolol daily  . Pneumonia     hx of;many yrs ago  . AAA (abdominal aortic aneurysm)   . Joint pain   . Joint swelling   . Urinary frequency     takes Terazosin daily  . Urinary urgency   . Nocturia   . Enlarged prostate     slightly  . Rheumatoid arthritis(714.0)     takes Plaquenil daily and Methotrexate weekly   Assessment: 37 yoM with metastatic cancer and new left subclavian vein and left brachiocephalic vein thrombosis.  Full dose lovenox was initiated on 5/6 but was d/ced on 5/8 d/t left side subdural hematoma noted on head MRI. Resumed anticoagulation with heparin 5/12 for thrombosis per MD's request.  Significant events: - neck lymph node biopsy on 5/9 - PICC placement and radiation treatment on 5/11 - head CT on 5/12: stable hematoma  Today, 07/12/2014   -  Heparin level was slightly above our narrower therapeutic goal this AM and heparin infusion rate was reduced.  Level now therapeutic at 0.45 with infusion at 1250 units/hr. - CBC is stable. No active bleeding documented. - Oncology note 5/13 suggests getting him transitioned to Lovenox since CT scan of brain did not show any change to subdural hematomas.  Goal of Therapy:  Heparin level 0.3-0.5 units/ml (will aim for lower end of usual heparin goal range of 0.3-0.7 d/t subdural hematoma) Monitor platelets by anticoagulation protocol: Yes   Plan:  - Continue heparin drip at 1250 units/hr - F/u HL in AM  Chasitee Zenker 07/12/2014,4:28 PM

## 2014-07-12 NOTE — Progress Notes (Addendum)
Physical Therapy Treatment Patient Details Name: [name] [name] MRN: [id number] DOB: [birth year] Today's Date: 07/12/2014    History of Present Illness 74 yo male admitted 07/04/14 with CT scan and MRI of the L-spine showing multiple abnormalities consistent with metastatic malignancy. CT abdomen and pelvis showing pleural effusion right greater than left. Patient complaining of hip pain. Patient had MRI done which showed left subdural hematomas. positive for thrombosis L subclavian vein.. Patient s/p biopsy yesterday off lymph nodes results are pending. Pt has epidural mass and multiple bony mets.     PT Comments    Pt performed RLE exercises with min assist, stood with RW and pivoted to recliner with mod assist. Instructed pt in independent exercises for RLE. Good progress today.  Follow Up Recommendations  SNF     Equipment Recommendations  None recommended by PT    Recommendations for Other Services       Precautions / Restrictions Precautions Precautions: Fall Precaution Comments: profound R leg weakness, will buckle, use KI for OOB Restrictions Weight Bearing Restrictions: No    Mobility  Bed Mobility Overal bed mobility: Needs Assistance Bed Mobility: Supine to Sit     Supine to sit: Mod assist;+2 for physical assistance;+2 for safety/equipment     General bed mobility comments: Assist to manage BIl LEs and trunk, heavy use of BUEs to push up from sidelying, instructed pt to self assist RLE with LLE  Transfers Overall transfer level: Needs assistance Equipment used: Rolling walker (2 wheeled) Transfers: Sit to/from [facility] Sit to Stand: Mod assist Stand pivot transfers: Mod assist;+2 safety/equipment         General transfer comment: cues for transition position and use of UEs to self assist; mod assist to power up with heavy use of BUEs , pt wore R knee immobilizer for stand pivot transfer to prevent buckling of RLE.   Ambulation/Gait                 Stairs            Wheelchair Mobility    Modified Rankin (Stroke Patients Only)       Balance                                    Cognition Arousal/Alertness: Awake/alert Behavior During Therapy: WFL for tasks assessed/performed Overall Cognitive Status: Within Functional Limits for tasks assessed                      Exercises General Exercises - Lower Extremity Ankle Circles/Pumps: AROM;Both;10 reps;Supine Quad Sets: AROM;Right;5 reps;Supine Gluteal Sets: AROM;Both;5 reps;Supine Heel Slides: AAROM;Right;10 reps;Supine    General Comments        Pertinent Vitals/Pain Pain Assessment: No/denies pain    Home Living                      Prior Function            PT Goals (current goals can now be found in the care plan section) Acute Rehab PT Goals Patient Stated Goal: Be able to ambulate and care for self as much as possible PT Goal Formulation: With patient Time For Goal Achievement: 07/20/14 Potential to Achieve Goals: Fair Progress towards PT goals: Progressing toward goals    Frequency  Min 3X/week    PT Plan Current plan remains appropriate    Co-evaluation  End of Session Equipment Utilized During Treatment: Gait belt, R knee immobilizer Activity Tolerance: Patient tolerated treatment well;Patient limited by fatigue Patient left: in chair;with call bell/phone within reach     Time: 1102-1145 PT Time Calculation (min) (ACUTE ONLY): 43 min  Charges:  $Therapeutic Exercise: 8-22 mins $Therapeutic Activity: 23-37 mins                    G Codes:      Philomena Doheny 07/12/2014, 11:56 AM [phone]

## 2014-07-12 NOTE — Progress Notes (Signed)
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 Other     Catfish + IV pain medication (unknown name)-vomits    Patient Measurements: Height: '5\' 7"'$  (170.2 cm) Weight: 138 lb 3.7 oz (62.7 kg) IBW/kg (Calculated) : 66.1 Heparin Dosing Weight:   Vital Signs: Temp: 97.8 F (36.6 C) (05/13 2207) Temp Source: Oral (05/13 2207) BP: 149/75 mmHg (05/13 2207) Pulse Rate: 73 (05/13 2207)  Labs:  Recent Labs  07/10/14 1336  07/11/14 0530 07/11/14 2354 07/12/14 0540  HGB 8.6*  --  9.1*  --  9.0*  HCT 26.7*  --  27.9*  --  27.3*  PLT 189  --  195  --  180  HEPARINUNFRC  --   < > 0.27* 0.42 0.53  CREATININE 0.36*  --  0.42*  --  0.47*  < > = values in this interval not displayed.  Estimated Creatinine Clearance: 75.1 mL/min (by C-G formula based on Cr of 0.47).   Medications:  Infusions:  . sodium chloride 30 mL/hr at 07/11/14 1333  . heparin      Assessment: Patient with now heparin level higher than reduced goal.  No heparin issues per RN.  Goal of Therapy:  Heparin level 0.3-0.5 units/ml Monitor platelets by anticoagulation protocol: Yes   Plan:  Decrease heparin to 1250 units/hr Recheck level at [address], [place] 07/12/2014,6:28 AM

## 2014-07-12 NOTE — Progress Notes (Signed)
PHARMACIST - PHYSICIAN COMMUNICATION CONCERNING:  Vancomycin  73 yoM on broad spectrum antibiotics vancomycin and zosyn for aspiration PNA.  Vancomycin trough tonight is just slightly subtherapeutic @ 14 (goal 15-20) on dose of Vancomycin '750mg'$  q8h. Renal function has been stable. Anticipate that pt will likely continue to accumulate drug therefore no changes to regimen.   Recheck trough if warranted.   RECOMMENDATION: Continue Vancomycin at current regimen.   Ralene Bathe, PharmD, BCPS 07/12/2014, 7:27 PM  Pager: [phone]

## 2014-07-12 NOTE — Progress Notes (Signed)
Patient ID: [name] [name], male   DOB: June 14, 1940, 74 y.o.   MRN: [id number]  [facility] PROGRESS NOTE  DAMIER DISANO OEV:[id number] DOB: [birth year] DOA: 07/04/2014 PCP: [name], MD   Brief narrative:    74 year old male with rheumatoid arthritis and hypertension, presented to [facility] ED with main concern of 3 weeks duration of bilateral chest pain and neck swelling, dyspnea with exertion and at rest, dysphagia and odynophagia, hemoptysis. PCP requested CT scan which was notable for massive thyroid gland with adjacent phlegmon, L>R, necrotic cervical lymphadenopathy c/w metastatic disease and possible acute suppurative thyroiditis. CT chest concerning for acute versus chronic, infectious versus inflammatory process, lytic lesions seen in spine and ribs.   Seen by oncology, ENT and endocrinology. Primary thyroid malignancy felt to be somewhat unusual and lymphoma possible consideration. Patient underwent MRI which found incidentally subdural hematoma. Patient has been started on low-dose anticoagulation, also started on IV steroids. He underwent biopsy with tissue results noting an adenocarcinoma that can be seen with either lung or thyroid consistent with metastatic disease. Patient also noted to be aspirating during barium swallow study on 5/9. Started on broad-spectrum IV antibiotics.  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 pain somewhat controlled - continue IV steroids - radiation treatment started May 11th, 2016, continue under the radiation oncology team guidance   Acute on chronic cancer related pain - in multiple sites throughout the body: upper and lower back area, bilateral LE's and bilateral UE's - continue analgesia as needed  - also continuing with radiation treatments   Lower extremity numbness and weakness, acute and worse 8/29 - certainly worrisome for lower back cord  compression - d/w Dr. Isidore Moos, plan to continue with radiation therapy to decrease the change of progressive neurological dysfunction - will need MRI lumbar and sacral spine, including bilateral hips but can be done Monday unless significant changes indicates stat MRI   Active Problems:  Rheumatoid arthritis - Plaquenil and methotrexate on hold.    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 [place] Pink - patient has a high clot risk given thrombus - started low dose AC, monitoring with daily CBC - Hg has remained stable with no signs of beleeding     Metabolic acidosis with hypokalemia  - provided IVF, pt has responded well and now this has resolved and is stable, at target range this AM   Leukocytosis - ikely stress margination plus secondary to steroids. - slowly trending down    Dysphagia - Felt to be secondary to enlarged thyroid - pathology still pending at this time. - able to tolerate soft diet    Poor dentition - Dental consult requested    Anemia of chronic disease, malignancy - Hg remains stable with no signs of bleeding - repeat CBC in AM   Acute respiratory failure secondary to pleural effusion, dysphagia, aspiration PNA - pleural effusion, ? Malignant - pt started on Vanc and Zosyn IV, will continue same regimen as respiratory status has been improving with ABX - may need thoracentesis, depending on the respiratory state - pt still requiring oxygen via [place], plan on tapering off oxygen if possible  Hyperthyroidism/enlarged thyroid - Questionable etiology. Thyroid studies noted suppressed TSH and elevated free T4/T3 - started on methimazole plus Decadron per endocrinologist - [place]  noted enlarged inhomogenous thyroid gland with calcification a single area in right lobe, no discrete thyroid mass.   SDH (subdural hematoma) - low dose AC as noted above    Severe PCM - in the context of acute illness - still very poor oral intake - nutritionist to be consulted   Code Status: Full code  Family Communication: Discussed with son at bedside at length, understand guarded prognosis and further plan in management. Waiting for pt's wife to continue our goals of care discussion. Emotional support provided to son and pt, both appreciated our time and concern. I encouraged family to ask questions with no hesitation. Also explained that we can provide interpreter for better communication if needed.   Disposition Plan: Barrier to discharge - pt still on IV ABX, cancer work up still in progress, given worsening LE weakness, cord compression must be ruled out but radiation therapy needs to be done today at the expense of doing an MRI. D/W Dr. Isidore Moos, we can proceed with MRI likely in AM.  IV access:  Peripheral IV  Procedures and diagnostic studies:    Dg Chest 2 View  07/06/2014  Stable bilateral pleural effusions and associated atelectasis. 2. Stable changes of COPD and borderline cardiomegaly.     Ct Head Wo Contrast  07/10/2014  Stable appearance of posterior and medial subdural hematoma is. 2. Stable appearance of anterior left frontal extra-axial fluid. This may represent hematoma or less likely tumor. 3. Stable appearance of infiltrative disease along the posterior medial left frontal lobe near the vertex.     Ct Head Wo Contrast   07/07/2014  Mild diffuse cortical atrophy. Mild chronic ischemic white matter disease.  Left frontal and posterior fossa subdural hematomas are noted as described on prior MRI.  High density material is noted in left parafalcine region for the vertex which may represent hemorrhage or leptomeningeal neoplastic disease as described on prior MRI.     Ct Soft  Tissue Neck W Contrast  07/03/2014  Massive thyroid gland enlargement with adjacent phlegmon. LEFT-greater-than- RIGHT necrotic cervical lymphadenopathy. 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 Acute suppurative thyroiditis is in the differential considerations. 2. 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 3. Markedly carious dentition with osteolysis in the RIGHT mandibular body. This may be odontogenic or metastatic.     Ct Chest Wo Contrast  07/03/2014  There are coarse reticular opacities with intervening hazy ground-glass opacity and associated if edematous cystic change in the right upper lobe. This could all be chronic. Acute infectious or inflammatory infiltrate is possible. 2. No discrete pulmonary mass. 4 mm pleural-based nodule noted in the peripheral right upper lobe. No other suspicious nodules 3. Small, right greater than left, pleural effusions with associated dependent atelectasis. No pulmonary edema. 4. Neck base abnormalities. Patient also underwent neck CT. There is ill-defined soft tissue that involves and surrounds the thyroid gland. The thyroid appears enlarged and heterogeneous. There are multiple prominent neck base lymph nodes. 5. There are lytic lesions involving 3 discrete ribs. The left fifth rib also has associated soft tissue mass. This is highly suspicious for metastatic disease to bone.    Mr Jeri Cos Wo Contrast  07/06/2014  Left side subdural hematomas  in both the posterior fossa and along the left hemisphere, appear to be new since the neck CT on 07/03/2014. 2. Abnormal appearance of a 3-4 cm area of the pachy- and leptomeninges at the left vertex over the left superior frontal gyrus. Given the possibility of metastatic thyroid carcinoma in this patient, this constellation may reflect a dural metastasis from  thyroid cancer, and thyroid metastases in the brain are prone to hemorrhage - which might explain #1. In the absence of a known malignancy, the differential diagnosis of this dural abnormality would include a dural vascular malformation. 3. No cerebral or cerebellar edema. No significant intracranial mass effect at this time.   Mr Lumbar Spine Wo Contrast  07/04/2014  Findings consistent with multifocal metastatic disease. 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 Most notably affected levels are from L3 to L5-S1 level.  Findings were discussed with Dr. Carmin Muskrat at the time of interpretation.     Nm Bone Scan Whole Body  07/08/2014  Abnormal increased bilateral rib activity compatible with metastatic disease. No other suspicious bony abnormalities identified.  Focus of increased activity within the lateral soft tissues of the upper left arm -indeterminate. Correlate clinically.   US Soft Tissue Head/neck  07/06/2014  Enlarged inhomogeneous thyroid gland with a single nonspecific calcification in the RIGHT lobe.  No discrete thyroid mass identified.  Scattered cervical lymph nodes normal to mildly enlarged in sizes.   Ct Abdomen Pelvis W Contrast  07/05/2014 No acute finding within the abdomen pelvis. 2. No extraosseous metastatic disease or evidence of a primary neoplasm. 3. 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 The visible vertebrae show a subtle heterogeneous pattern without a discrete osteoblastic or osteolytic lesion. 4. Moderate right and small to moderate left pleural effusions with significant associated lower lobe atelectasis. No lung base nodules.     Dg Esophagus  07/07/2014  Suboptimal exam due to immediate aspiration, which elicited a cough reflex. No esophageal mass or stricture identified.     US Biopsy   07/11/2014   Technically successful ultrasound guided right cervical lymph node biopsy for additional tissue for molecular testing.    US Biopsy   07/07/2014  Ultrasound survey demonstrates multiple abnormal nodes along the left cervical chain.  Images during the case demonstrate needle placement within the nodes on each pass.  Final image demonstrates no complicating features.  IMPRESSION: Status post ultrasound-guided biopsy of left cervical lymph nodes. Specimen sent to pathology for complete histopathologic analysis.    Dg Chest Port 1 View  07/09/2014  Right arm PICC tip in the SVC  Right upper lobe airspace disease may represent edema or pneumonia. Bibasilar atelectasis and effusion unchanged.   Dg Chest Port 1 View  07/07/2014  Left lower lobe airspace consolidation.. Bilateral pleural effusions and interstitial edema consistent with pneumonia.    Medical Consultants:    Oncology  ENT  Endocrinology  Pulmonary  Interventional radiology  IAnti-Infectives:    IV Zosyn 5/9-present  IV vancomycin 5/10-present  Faye Ramsay, MD  [facility] Pager [phone]  If 7PM-7AM, please contact night-coverage [email or web address] Password [facility] 07/12/2014, 12:45 PM   LOS: 8 days   HPI/Subjective: No events overnight. Still with LE weakness but overall better   Objective: Filed Vitals:   07/11/14 2143 07/11/14 2207 07/12/14 0648 07/12/14 0859  BP:  149/75 153/78   Pulse:  73 67   Temp:  97.8 F (36.6 C) 97.8 F (36.6 C)   TempSrc:  Oral Axillary   Resp:  16 18   Height:      Weight:      SpO2: 96% 97% 97% 93%    Intake/Output Summary (Last 24 hours) at 07/12/14 1245 Last data filed at 07/12/14 0842  Gross per 24 hour  Intake    150 ml  Output    650 ml  Net   -500 ml    Exam:   General:  Pt is alert, follows commands appropriately, not in acute distress  Cardiovascular: Regular rate and rhythm,  no rubs, no gallops  Respiratory: Clear to auscultation bilaterally,  diminished breath sounds at bases with crackles   Abdomen: Soft, non tender, non distended, bowel sounds present, no guarding  Extremities: pulses DP and PT palpable bilaterally  Data Reviewed: Basic Metabolic Panel:  Recent Labs Lab 07/07/14 0401 07/08/14 0400 07/09/14 0340 07/10/14 1336 07/11/14 0530 07/12/14 0540  NA 136 137 137 136  --  138  K 4.2 4.2 4.4 3.3*  --  3.7  CL 110 108 107 115*  --  107  CO2 18* 20* 20* 18*  --  24  GLUCOSE 122* 112* 114* 111*  --  117*  BUN 31* 33* 35* 34*  --  31*  CREATININE 0.49* 0.50* 0.51* 0.36* 0.42* 0.47*  CALCIUM 8.1* 7.9* 7.8* 6.7*  --  7.2*  MG 2.5* 2.4 2.5*  --   --   --    Liver Function Tests:  Recent Labs Lab 07/08/14 0400 07/09/14 0340 07/10/14 1336  AST 41 37 33  ALT 46 51 50  ALKPHOS 59 59 55  BILITOT 1.0 1.1 0.9  PROT 5.3* 5.3* 4.7*  ALBUMIN 2.7* 2.7* 2.5*   CBC:  Recent Labs Lab 07/07/14 0401 07/08/14 0400 07/09/14 0340 07/10/14 0420 07/10/14 1336 07/11/14 0530 07/12/14 0540  WBC 20.9* 23.2* 22.9* 30.1* 29.4* 27.3* 26.0*  NEUTROABS 17.7* 19.8*  --   --  26.0*  --   --   HGB 8.8* 9.0* 9.2* 9.3* 8.6* 9.1* 9.0*  HCT 26.3* 27.4* 28.3* 28.7* 26.7* 27.9* 27.3*  MCV 94.6 94.5 94.6 95.0 96.0 95.9 95.1  PLT 172 167 179 201 189 195 180   CBG:  Recent Labs Lab 07/08/14 0805 07/09/14 0747 07/10/14 0818 07/11/14 0751 07/12/14 0746  GLUCAP 100* 100* 169* 108* 118*    Recent Results (from the past 240 hour(s))  Culture, blood (routine x 2)     Status: None   Collection Time: 07/04/14  1:54 PM  Result Value Ref Range Status   Specimen Description BLOOD LEFT ANTECUBITAL  Final   Special Requests BOTTLES DRAWN AEROBIC AND ANAEROBIC 5 CC EA  Final   Culture   Final    NO GROWTH 5 DAYS Performed at [facility]    Report Status 07/10/2014 FINAL  Final  Culture, blood (routine x 2)     Status: None   Collection Time: 07/04/14  1:55 PM  Result Value Ref Range Status   Specimen Description BLOOD  BLOOD RIGHT FOREARM  Final   Special Requests BOTTLES DRAWN AEROBIC AND ANAEROBIC 5 CC EA  Final   Culture   Final    NO GROWTH 5 DAYS Performed at [facility]    Report Status 07/10/2014 FINAL  Final  Culture, Urine     Status: None   Collection Time: 07/04/14  1:59 PM  Result  Value Ref Range Status   Specimen Description URINE, RANDOM  Final   Special Requests NONE  Final   Colony Count NO GROWTH Performed at [facility]   Final   Report Status 07/05/2014 FINAL  Final  MRSA PCR Screening     Status: None   Collection Time: 07/04/14  2:42 PM  Result Value Ref Range Status   MRSA by PCR NEGATIVE NEGATIVE Final     Scheduled Meds: . acetaminophen  500 mg Oral TID  . antiseptic oral rinse  7 mL Mouth Rinse BID  . benazepril  40 mg Oral Daily  . budesonide-formoterol  2 puff Inhalation BID  . dexamethasone  8 mg Oral Q12H  . docusate  100 mg Oral BID  . feeding supplement (ENSURE ENLIVE)  237 mL Oral BID BM  . feeding supplement (RESOURCE BREEZE)  1 Container Oral BID BM  . guaiFENesin  1,200 mg Oral BID  . methimazole  60 mg Oral Daily  . OxyCODONE  15 mg Oral Q12H  . pantoprazole  40 mg Oral BID  . piperacillin-tazobactam (ZOSYN)  IV  3.375 g Intravenous Q8H  . propranolol  60 mg Oral QID  . sodium chloride  10-40 mL Intracatheter Q12H  . sodium chloride  3 mL Intravenous Q12H  . vancomycin  750 mg Intravenous Q8H   Continuous Infusions: . sodium chloride 30 mL/hr at 07/11/14 1333  . heparin 1,250 Units/hr (07/12/14 0900)

## 2014-07-13 LAB — CBC
HCT: 26.8 % — ABNORMAL LOW (ref 39.0–52.0)
HEMOGLOBIN: 8.8 g/dL — AB (ref 13.0–17.0)
MCH: 31.1 pg (ref 26.0–34.0)
MCHC: 32.8 g/dL (ref 30.0–36.0)
MCV: 94.7 fL (ref 78.0–100.0)
Platelets: 166 10*3/uL (ref 150–400)
RBC: 2.83 MIL/uL — ABNORMAL LOW (ref 4.22–5.81)
RDW: 14.4 % (ref 11.5–15.5)
WBC: 27.2 10*3/uL — AB (ref 4.0–10.5)

## 2014-07-13 LAB — BASIC METABOLIC PANEL
Anion gap: 8 (ref 5–15)
BUN: 26 mg/dL — ABNORMAL HIGH (ref 6–20)
CO2: 23 mmol/L (ref 22–32)
Calcium: 7.4 mg/dL — ABNORMAL LOW (ref 8.9–10.3)
Chloride: 106 mmol/L (ref 101–111)
Creatinine, Ser: 0.42 mg/dL — ABNORMAL LOW (ref 0.61–1.24)
GFR calc Af Amer: >60 mL/min (ref >60)
GFR calc non Af Amer: >60 mL/min (ref >60)
GLUCOSE: 117 mg/dL — AB (ref 65–99)
Potassium: 3.8 mmol/L (ref 3.5–5.1)
SODIUM: 137 mmol/L (ref 135–145)

## 2014-07-13 LAB — HEPARIN LEVEL (UNFRACTIONATED): HEPARIN UNFRACTIONATED: 0.46 [IU]/mL (ref 0.30–0.70)

## 2014-07-13 LAB — GLUCOSE, CAPILLARY: Glucose-Capillary: 121 mg/dL — ABNORMAL HIGH (ref 65–99)

## 2014-07-13 MED ORDER — HYDRALAZINE HCL 20 MG/ML IJ SOLN
5.0000 mg | Freq: Four times a day (QID) | INTRAMUSCULAR | Status: DC | PRN
  Administered 2014-07-13 – 2014-07-14 (×2): 5 mg via INTRAVENOUS
  Filled 2014-07-13 (×2): qty 1

## 2014-07-13 NOTE — Progress Notes (Addendum)
Patient ID: [name] [name], male   DOB: 1940/09/08, 74 y.o.   MRN: [id number]  [facility] PROGRESS NOTE  KYRILLOS ADAMS NIO:[id number] DOB: 09-07-1940 DOA: 07/04/2014 PCP: [name], MD   Brief narrative:    74 year old male with rheumatoid arthritis and hypertension, presented to [facility] ED with main concern of 3 weeks duration of bilateral chest pain and neck swelling, dyspnea with exertion and at rest, dysphagia and odynophagia, hemoptysis. PCP requested CT scan which was notable for massive thyroid gland with adjacent phlegmon, L>R, necrotic cervical lymphadenopathy c/w metastatic disease and possible acute suppurative thyroiditis. CT chest concerning for acute versus chronic, infectious versus inflammatory process, lytic lesions seen in spine and ribs.   Seen by oncology, ENT and endocrinology. Primary thyroid malignancy felt to be somewhat unusual and lymphoma possible consideration. Patient underwent MRI which found incidentally subdural hematoma. Patient has been started on low-dose anticoagulation, also started on IV steroids. He underwent biopsy with tissue results noting an adenocarcinoma that can be seen with either lung or thyroid consistent with metastatic disease. Patient also noted to be aspirating during barium swallow study on 5/9. Started on broad-spectrum IV antibiotics.  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 pain somewhat controlled - continue PO steroids - radiation treatment started May 11th, 2016, continue under the radiation oncology team guidance   Acute on chronic cancer related pain - in multiple sites throughout the body: upper and lower back area, bilateral LE's and bilateral UE's - pt reports pain is better controlled on current regimen  - continue analgesia as needed  - also continuing with radiation treatments   Lower extremity numbness and weakness, acute and  worse 8/18 - certainly worrisome for lower back cord compression - d/w Dr. Isidore Moos, plan to continue with radiation therapy to decrease the change of progressive neurological dysfunction - will need MRI lumbar and sacral spine, including bilateral hips but can be done Monday unless significant changes indicates stat MRI - order placed    Active Problems:  Rheumatoid arthritis - Plaquenil and methotrexate on hold.    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 Maryland Pink - patient has a high clot risk given thrombus - started low dose AC, monitoring with daily CBC - Hg has remained stable with no signs of beleeding     Metabolic acidosis with hypokalemia  - provided IVF, pt has responded well and now this has resolved and is stable, at target range this AM   Leukocytosis - ikely stress margination plus secondary to steroids. - slowly trending down  - will repeat CBC in AM   Dysphagia - Felt to be secondary to enlarged thyroid - pathology still pending at this time. - able to tolerate soft diet    Poor dentition - Dental consult requested, awaiting for recommendations    Anemia of chronic disease, malignancy - Hg remains stable with no signs of bleeding - repeat CBC in AM   Acute respiratory failure secondary to pleural effusion, dysphagia, aspiration PNA - pleural effusion, ? 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 Thyroid studies noted suppressed TSH and elevated free T4/T3 - started on methimazole plus Decadron per endocrinologist - [place] noted enlarged inhomogenous thyroid gland with calcification a single area in right lobe, no discrete thyroid mass. - appreciate endocrinologist Dr. Cindra Eves help    SDH (subdural hematoma) - low dose AC as noted above    Severe PCM - in the context of acute illness - still very poor oral intake - nutritionist to be consulted   Code Status: Full code  Family Communication: Discussed with son at bedside at length, understand guarded prognosis and further plan in management. Waiting for pt's wife to continue our goals of care discussion. Emotional support provided to son and pt, both appreciated our time and concern. I encouraged family to ask questions with no hesitation. Also explained that we can provide interpreter for better communication if needed.   Disposition Plan: Barrier to discharge - pt still on IV ABX, cancer work up still in progress, given worsening LE weakness, cord compression must be ruled out but radiation therapy needs to be done today at the expense of doing an MRI. D/W Dr. Isidore Moos, we can proceed with MRI likely in AM.  IV access:  Peripheral IV  Procedures and diagnostic studies:    Dg Chest 2 View  07/06/2014  Stable bilateral pleural effusions and associated atelectasis. 2. Stable changes of COPD and borderline cardiomegaly.     Ct Head Wo Contrast  07/10/2014  Stable appearance of posterior and medial subdural hematoma is. 2. Stable appearance of anterior left frontal extra-axial fluid. This may represent hematoma or less likely tumor. 3. Stable appearance of infiltrative disease along the posterior medial left frontal lobe near the vertex.     Ct Head Wo Contrast   07/07/2014  Mild diffuse cortical atrophy. Mild chronic ischemic white matter disease.   Left frontal and posterior fossa subdural hematomas are noted as described on prior MRI.  High density material is noted in left parafalcine region for the vertex which may represent hemorrhage or leptomeningeal neoplastic disease as described on prior MRI.     Ct Soft Tissue Neck W Contrast  07/03/2014  Massive thyroid gland enlargement with adjacent phlegmon. LEFT-greater-than- RIGHT necrotic cervical lymphadenopathy. 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 Acute suppurative thyroiditis is in the differential considerations. 2. 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 3. Markedly carious dentition with osteolysis in the RIGHT mandibular body. This may be odontogenic or metastatic.     Ct Chest Wo Contrast  07/03/2014  There are coarse reticular opacities with intervening hazy ground-glass opacity and associated if edematous cystic change in the right upper lobe. This could all be chronic. Acute infectious or inflammatory infiltrate is possible. 2. No discrete pulmonary mass. 4 mm pleural-based nodule noted in the peripheral right upper lobe. No other suspicious nodules 3. Small, right greater than left, pleural effusions with associated dependent atelectasis. No pulmonary edema. 4. Neck base abnormalities. Patient also underwent neck CT. There is ill-defined soft tissue that involves and surrounds the thyroid gland. The thyroid appears enlarged and heterogeneous. There are multiple prominent neck base  lymph nodes. 5. There are lytic lesions involving 3 discrete ribs. The left fifth rib also has associated soft tissue mass. This is highly suspicious for metastatic disease to bone.    Mr Jeri Cos Wo Contrast  07/06/2014  Left side subdural hematomas in both the posterior fossa and along the left hemisphere, appear to be new since the neck CT on  07/03/2014. 2. Abnormal appearance of a 3-4 cm area of the pachy- and leptomeninges at the left vertex over the left superior frontal gyrus. 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 In the absence of a known malignancy, the differential diagnosis of this dural abnormality would include a dural vascular malformation. 3. No cerebral or cerebellar edema. No significant intracranial mass effect at this time.   Mr Lumbar Spine Wo Contrast  07/04/2014  Findings consistent with multifocal metastatic disease. 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 Most notably affected levels are from L3 to L5-S1 level.  Findings were discussed with Dr. Carmin Muskrat at the time of interpretation.     Nm Bone Scan Whole Body  07/08/2014  Abnormal increased bilateral rib activity compatible with metastatic disease. No other suspicious bony abnormalities identified.  Focus of increased activity within the lateral soft tissues of the upper left arm -indeterminate. Correlate clinically.   US Soft Tissue Head/neck  07/06/2014  Enlarged inhomogeneous thyroid gland with a single nonspecific calcification in the RIGHT lobe.  No discrete thyroid mass identified.  Scattered cervical lymph nodes normal to mildly enlarged in sizes.   Ct Abdomen Pelvis W Contrast  07/05/2014 No acute finding within the abdomen pelvis. 2. No extraosseous metastatic disease or evidence of a primary neoplasm. 3. 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 The visible vertebrae show a subtle heterogeneous pattern without a discrete osteoblastic or osteolytic lesion. 4. Moderate right and small to moderate  left pleural effusions with significant associated lower lobe atelectasis. No lung base nodules.     Dg Esophagus  07/07/2014  Suboptimal exam due to immediate aspiration, which elicited a cough reflex. No esophageal mass or stricture identified.     US Biopsy  07/11/2014   Technically successful ultrasound guided right cervical lymph node biopsy for additional tissue for molecular testing.    US Biopsy   07/07/2014  Ultrasound survey demonstrates multiple abnormal nodes along the left cervical chain.  Images during the case demonstrate needle placement within the nodes on each pass.  Final image demonstrates no complicating features.  IMPRESSION: Status post ultrasound-guided biopsy of left cervical lymph nodes. Specimen sent to pathology for complete histopathologic analysis.    Dg Chest Port 1 View  07/09/2014  Right arm PICC tip in the SVC  Right upper lobe airspace disease may represent edema or pneumonia. Bibasilar atelectasis and effusion unchanged.   Dg Chest Port 1 View  07/07/2014  Left lower lobe airspace consolidation.. Bilateral pleural effusions and interstitial edema consistent with pneumonia.    Medical Consultants:    Oncology  ENT  Endocrinology  Pulmonary  Interventional radiology  IAnti-Infectives:    IV Zosyn 5/9-present  IV vancomycin 5/10-present  Faye Ramsay, MD  [facility] Pager [phone]  If 7PM-7AM, please contact night-coverage [email or web address] Password  [email or web address] 07/13/2014, 3:50 PM   LOS: 9 days   HPI/Subjective: No events overnight. Still with LE weakness but overall better   Objective: Filed Vitals:   07/12/14 2300 07/13/14 0613 07/13/14 0758 07/13/14 1438  BP: 172/82 161/87  161/114  Pulse: 71 72  70  Temp: 98.1 F (36.7 C) 97.5 F (36.4 C)  97.7 F (36.5 C)  TempSrc: Oral Oral  Oral  Resp: '18 18  20  '$ Height:      Weight:      SpO2: 95% 96% 92% 96%    Intake/Output Summary (Last 24 hours) at 07/13/14 1550 Last data filed at 07/13/14 1440   Gross per 24 hour  Intake    480 ml  Output    900 ml  Net   -420 ml    Exam:   General:  Pt is alert, follows commands appropriately, not in acute distress  Cardiovascular: Regular rate and rhythm,  no rubs, no gallops  Respiratory: Clear to auscultation bilaterally, diminished breath sounds at bases with crackles   Abdomen: Soft, non tender, non distended, bowel sounds present, no guarding  Extremities: pulses DP and PT palpable bilaterally  Data Reviewed: Basic Metabolic Panel:  Recent Labs Lab 07/07/14 0401 07/08/14 0400 07/09/14 0340 07/10/14 1336 07/11/14 0530 07/12/14 0540 07/13/14 0502  NA 136 137 137 136  --  138 137  K 4.2 4.2 4.4 3.3*  --  3.7 3.8  CL 110 108 107 115*  --  107 106  CO2 18* 20* 20* 18*  --  24 23  GLUCOSE 122* 112* 114* 111*  --  117* 117*  BUN 31* 33* 35* 34*  --  31* 26*  CREATININE 0.49* 0.50* 0.51* 0.36* 0.42* 0.47* 0.42*  CALCIUM 8.1* 7.9* 7.8* 6.7*  --  7.2* 7.4*  MG 2.5* 2.4 2.5*  --   --   --   --    Liver Function Tests:  Recent Labs Lab 07/08/14 0400 07/09/14 0340 07/10/14 1336  AST 41 37 33  ALT 46 51 50  ALKPHOS 59 59 55  BILITOT 1.0 1.1 0.9  PROT 5.3* 5.3* 4.7*  ALBUMIN 2.7* 2.7* 2.5*   CBC:  Recent Labs Lab 07/07/14 0401 07/08/14 0400  07/10/14 0420 07/10/14 1336 07/11/14 0530 07/12/14 0540 07/13/14 0502  WBC 20.9* 23.2*  < > 30.1* 29.4* 27.3* 26.0* 27.2*  NEUTROABS 17.7* 19.8*  --   --  26.0*  --   --   --   HGB 8.8* 9.0*  < > 9.3* 8.6* 9.1* 9.0* 8.8*  HCT 26.3* 27.4*  < > 28.7* 26.7* 27.9* 27.3* 26.8*  MCV 94.6 94.5  < > 95.0 96.0 95.9 95.1 94.7  PLT 172 167  < > 201 189 195 180 166  < > = values in this interval not displayed. CBG:  Recent Labs Lab 07/09/14 0747 07/10/14 0818 07/11/14 0751 07/12/14 0746 07/13/14 0728  GLUCAP 100* 169* 108* 118* 121*    Recent Results (from the past 240 hour(s))  Culture, blood (routine x 2)     Status: None   Collection Time: 07/04/14  1:54 PM  Result  Value Ref Range Status   Specimen Description BLOOD LEFT ANTECUBITAL  Final   Special Requests BOTTLES DRAWN AEROBIC AND ANAEROBIC 5 CC EA  Final   Culture   Final    NO GROWTH 5 DAYS Performed at [facility]    Report Status 07/10/2014 FINAL  Final  Culture, blood (routine x 2)  Status: None   Collection Time: 07/04/14  1:55 PM  Result Value Ref Range Status   Specimen Description BLOOD BLOOD RIGHT FOREARM  Final   Special Requests BOTTLES DRAWN AEROBIC AND ANAEROBIC 5 CC EA  Final   Culture   Final    NO GROWTH 5 DAYS Performed at [facility]    Report Status 07/10/2014 FINAL  Final  Culture, Urine     Status: None   Collection Time: 07/04/14  1:59 PM  Result Value Ref Range Status   Specimen Description URINE, RANDOM  Final   Special Requests NONE  Final   Colony Count NO GROWTH Performed at [facility]   Final   Report Status 07/05/2014 FINAL  Final  MRSA PCR Screening     Status: None   Collection Time: 07/04/14  2:42 PM  Result Value Ref Range Status   MRSA by PCR NEGATIVE NEGATIVE Final     Scheduled Meds: . acetaminophen  500 mg Oral TID  . antiseptic oral rinse  7 mL Mouth Rinse BID  . benazepril  40 mg Oral Daily  . budesonide-formoterol  2 puff Inhalation BID  . dexamethasone  8 mg Oral Q12H  . docusate  100 mg Oral BID  . feeding supplement (ENSURE ENLIVE)  237 mL Oral BID BM  . feeding supplement (RESOURCE BREEZE)  1 Container Oral BID BM  . guaiFENesin  1,200 mg Oral BID  . methimazole  60 mg Oral Daily  . OxyCODONE  15 mg Oral Q12H  . pantoprazole  40 mg Oral BID  . piperacillin-tazobactam (ZOSYN)  IV  3.375 g Intravenous Q8H  . propranolol  60 mg Oral QID  . sodium chloride  10-40 mL Intracatheter Q12H  . sodium chloride  3 mL Intravenous Q12H  . vancomycin  750 mg Intravenous Q8H   Continuous Infusions: . sodium chloride 30 mL/hr at 07/13/14 1112  . heparin 1,250 Units/hr (07/12/14 0900)

## 2014-07-13 NOTE — Progress Notes (Addendum)
Subjective: [name] [name] reports feeling "better" overall--though he was "almost dying" before his hospital arrival.  Prior to the hospital stay, he had dysphagia even to soft foods--severe enough that "I couldn't eat anything".  Now he is able to eat and drink. He has eaten a hamburger and piece of steak without choking. He describes himself as hungry.  He feels that his goiter is less prominent.  He reports tolerating his two radiation treatments to his chest and neck.  He again reports that he never took any thyroid hormone by prescription or by supplements. He reports no use of herbal supplements before his hospital stay.    Review of systems: No tremors, insomnia, rash, fever, sore throat, jaundice, diarrhea, or vomiting. He reports hypersomnia.  His nausea seems to be resolving.  Objective: Vital signs in last 24 hours: Temp:  [97.5 F (36.4 C)-98.1 F (36.7 C)] 97.5 F (36.4 C) (05/15 2585) Pulse Rate:  [65-72] 72 (05/15 0613) Resp:  [18] 18 (05/15 0613) BP: (161-185)/(71-87) 161/87 mmHg (05/15 0613) SpO2:  [92 %-97 %] 92 % (05/15 0758) FiO2 (%):  [30 %] 30 % (05/14 2111) Weight change:  Last BM Date: 07/11/14  Intake/Output from previous day: 05/14 0701 - 05/15 0700 In: 660 [P.O.:300; I.V.:360] Out: 650 [Urine:650] Intake/Output this shift:   General: No apparent distress. Eyes: Anicteric, no scleral show, no lid lag. Neck: Supple, trachea midline. Thyroid: Diffuse enlargement of thyroid gland with diffuse moderately excessive firmness, yet the thyroid gland seems less prominent now.  Cardiovascular: Regular rhythm and rate, normal radial pulses. Respiratory: Normal respiratory effort, clear to auscultation. Gastrointestinal: Normal pitch active bowel sounds, nontender abdomen without distention. Neurologic: Cranial nerves normal as tested, biceps deep tendon reflexes 2+ and brisk, no tremor. Musculoskeletal: Normal muscle tone, mild diffuse muscle atrophy. Skin: Appropriate  warmth versus mildly excessive warmth, no visible rash. Mental status: Alert, conversant, speech clear, thought logical, appropriate mood and affect, no hallucinations or delusions evident. Hematologic/lymphatic: Cervical adenopathy, no jaundice.  Lab Results: Lab Results  Component Value Date   TSH 0.016* 07/04/2014   FREET4 5.65* 07/04/2014   WBC 27.2* 07/13/2014   NEUTROABS 26.0* 07/10/2014   HGB 8.8* 07/13/2014   PLT 166 07/13/2014   AST 33 07/10/2014   ALT 50 07/10/2014   ALKPHOS 55 07/10/2014   BILITOT 0.9 07/10/2014   ALBUMIN 2.5* 07/10/2014   THYROGLOBULI <1.0 07/06/2014   CALCITONIN <2.0 07/08/2014   CEA 1.1 07/04/2014   CA199 546* 07/04/2014   T3TOTAL 348* 07/04/2014   PREGSERUM NEGATIVE 07/08/2014    Studies/Results: REPORT OF SURGICAL PATHOLOGY FINAL DIAGNOSIS Diagnosis Lymph node, needle/core biopsy, left neck - POSITIVE FOR CARCINOMA. - SEE COMMENT. Microscopic Comment Sections demonstrate tumor with a small amount of associated lymphoid material. Given that the tumor sample is from a lymph node in the left neck away from the thyroid, the findings are consistent with metastatic carcinoma. Immunohistochemical stains are performed. The tumor is positive for cytokeratin 7 and TTF-1 while it is negative for CDX-2, cytokeratin 5/6, cytokeratin 20, S100, thyroglobulin and Napsin-A. The staining pattern is consistent with a carcinoma (adenocarcinoma) with the differential including a carcinoma of lung or primary thyroid origin. Please correlate with clinical and radiologic impression. The findings are called to Dr. Marin Olp on 07/10/2014. Dr. Donato Heinz has seen this case in consultation with agreement. Of note, there is likely inadequate material present for additional testing. (RH:kh 07/10/14) Willeen Niece MD Pathologist, Electronic Signature (Case signed 07/10/2014)   THYROID ULTRASOUND COMPARISON: None ;  correlation CT soft tissue neck 07/03/2014 FINDINGS: Right  thyroid lobe: Measurements: 6.2 x 4.3 x 2.5 cm. Tiny calcification 2 mm diameter within RIGHT lobe. Diffusely inhomogeneous echogenicity. No discrete mass. Left thyroid lobe: Measurements: 5.4 x 2.6 x 3.2 cm. Heterogeneous echogenicity without discrete mass. Isthmus: Thickness: 15 mm thick, thickened. Inhomogeneous echogenicity without focal mass. Lymphadenopathy: Numerous lymph nodes identified in the cervical regions bilaterally up to 10 mm short axis diameters. IMPRESSION: Enlarged inhomogeneous thyroid gland with a single nonspecific calcification in the RIGHT lobe. No discrete thyroid mass identified. Scattered cervical lymph nodes normal to mildly enlarged in sizes. Patient received IV contrast material on 07/03/2014 and again on 07/05/2014; the iodine load within these contrast administrations will prevent performance of an accurate radionuclide thyroid uptake and scan as well as potential radioactive iodine therapy for hyperthyroidism for 6-8 weeks.   Electronically Signed  By: Lavonia Dana M.D.  On: 07/06/2014 13:30  Medications: I have reviewed the patient's current medications.  Assessment/Plan: #1.  Hyperthyroidism. #2.  Goiter. #3.  Metastatic adenocarcinoma. #4.  Recent iodinated contrast exposure. #5.  Medication monitoring.  Today I reviewed laboratory reports, pathology reports, radiology reports, nuclear medicine bone scan images, and thyroid ultrasound images.  No clear laboratory evidence of medullary thyroid carcinoma.  We await the thyroglobulin level.  A low thyroglobulin level in the setting of thyrotoxicosis might indicate exogenous thyroid hormone ingestion.  A moderately high thyroglobulin level in the setting of thyrotoxicosis may occur with many causes of goiter, thyroiditis, or hyperthyroidism.  A markedly high thyroglobulin level may occur with differentiated thyroid carcinoma with skeletal metastases.    No clear laboratory evidence for  hCG-mediated hyperthyroidism (such as from a testicular germ cell tumor or rarely with trophoblastic differentiation of a primary lung cancer).  No clear laboratory evidence for thyroid autoimmunity, such as Hashimoto's thyroiditis or Graves' disease.  Usually when Graves' disease is causing hyperthyroidism, thyroid-stimulating immunoglobulin is elevated.  His thyroid-stimulating immunoglobulin level was normal. I have added thyrotropin receptor antibody test.  His thyrotoxicosis might be from a thyroiditis.  In that case, the thyrotoxicosis usually resolves within 1-3 months and does not require antithyroid medicine.  Since the cause is not entirely clear at this moment and he recently received iodinated contrast, I recommend continuing methimazole for now.  While hospitalized, I recommend monitoring Free T4 level, complete blood counts with differential, and liver function tests (with either a complete metabolic panel or a hepatic panel) once a week.  I have ordered those laboratory tests for the beginning of next week.    Dexamethasone was ordered by medical oncologist, Dr. Burney Gauze.  Propanolol was ordered and is managed by the hospitalist team.  As noted previously, these may serve as adjuncts to thyrotoxicosis management.  I will leave the management of those two medicines to the ordering physicians.    For the past one week, I have been reviewing his medical record daily.  I plan to visit [name] [name] at least once a week for now.     LOS: 9 days   Nadene Witherspoon 07/13/2014, 8:42 AM

## 2014-07-13 NOTE — Progress Notes (Signed)
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 Other     Catfish + IV pain medication (unknown name)-vomits    Patient Measurements: Height: '5\' 7"'$  (170.2 cm) Weight: 138 lb 3.7 oz (62.7 kg) IBW/kg (Calculated) : 66.1 Heparin Dosing Weight:   Vital Signs: Temp: 97.5 F (36.4 C) (05/15 0613) Temp Source: Oral (05/15 0613) BP: 161/87 mmHg (05/15 0613) Pulse Rate: 72 (05/15 0613)  Labs:  Recent Labs  07/11/14 0530  07/12/14 0540 07/12/14 1550 07/13/14 0500 07/13/14 0502  HGB 9.1*  --  9.0*  --   --  8.8*  HCT 27.9*  --  27.3*  --   --  26.8*  PLT 195  --  180  --   --  166  HEPARINUNFRC 0.27*  < > 0.53 0.45 0.46  --   CREATININE 0.42*  --  0.47*  --   --  0.42*  < > = values in this interval not displayed.  Estimated Creatinine Clearance: 75.1 mL/min (by C-G formula based on Cr of 0.42).   Medications:  Infusions:  . sodium chloride 30 mL/hr at 07/11/14 1333  . heparin 1,250 Units/hr (07/12/14 0900)    Assessment: Patient with heparin level at goal again.  No issues noted.  Goal of Therapy:  Heparin level 0.3-0.5 units/ml Monitor platelets by anticoagulation protocol: Yes   Plan:  Continue heparin drip at current rate Recheck level with 5/16 AM labs  [name] [name], [name] [name] 07/13/2014,6:51 AM

## 2014-07-13 NOTE — Progress Notes (Signed)
Nutrition Follow-up  DOCUMENTATION CODES:  Not applicable  INTERVENTION:  - Continue Resource Breeze po BID, each supplement provides 250 kcal and 9 grams of protein - Continue Ensure Enlive po BID, each supplement provides 350 kcal and 20 grams of protein -Encourage PO intake -RD to continue to monitor  NUTRITION DIAGNOSIS:  Inadequate oral intake related to inability to eat as evidenced by NPO status  (resolved.).  New Nutrition Diagnosis: Increased nutrient needs related to cancer and cancer related treatments as evidenced by estimated nutritional needs.  GOAL:  Patient will meet greater than or equal to 90% of their needs  Meeting.  MONITOR:  PO intake, Supplement acceptance, Labs, Weight trends, Skin, I & O's  REASON FOR ASSESSMENT:  Consult Assessment of nutrition requirement/status  ASSESSMENT: 74 y.o. patient with metastatic cancer with complaints of a 3 month history of chest pain, bilateral rib pain left greater than right, weakness, neck swelling which has worsened over the past 3 weeks to the point where he sometimes has difficulty breathing and problems swallowing. Patient also with a six-month period of a 10 pound weight loss. Patient does endorse decreased oral intake and decreased appetite over the past month. Patient states recently he's been able to eat is a piece of bread and a cup of milk daily.  5/10: -Pt with an 8 lb wt loss since November 2015. (5%- not significant for time frame) - Pt failed SLP evaluation and is currently NPO. SLP recommends nutrition via alternative means.  - Pt reports that he does not want a "hole in his stomach." - RD will monitor for diet advancement vs tube feeding initiation.  5/11: - Per RN, pt wanted to eat despite being made aware of aspiration risk. Diet upgraded to soft diet.  - Pt ate ~25% of breakfast. He said that he has not eaten very much because "he has been kept busy." - RD to order supplements and monitor  for tolerance.  - TF recomendations provided if pt does not tolerate po diet.   5/15: -RD consulted for nutritional assessment d/t pt's poor PO intake -PO intake has improved, eating ~100% of CHO modified diet -Pt's appetite and swallowing function has improved. Per Endocrinology note, pt reported eating hamburger and steak without issues. -RD will continue to monitor PO intake and supplemental tolerance.  Height:  Ht Readings from Last 1 Encounters:  07/04/14 '5\' 7"'$  (1.702 m)    Weight:  Wt Readings from Last 1 Encounters:  07/10/14 138 lb 3.7 oz (62.7 kg)    Ideal Body Weight:  67.3 kg  Wt Readings from Last 10 Encounters:  07/10/14 138 lb 3.7 oz (62.7 kg)  01/14/14 147 lb 3.2 oz (66.769 kg)  12/11/13 147 lb 4.3 oz (66.8 kg)  12/05/13 148 lb 1.6 oz (67.178 kg)  11/29/13 149 lb (67.586 kg)  11/19/13 146 lb 11.2 oz (66.543 kg)  11/12/13 145 lb (65.772 kg)  08/25/11 140 lb (63.504 kg)  02/09/10 143 lb (64.864 kg)  02/19/09 148 lb (67.132 kg)    BMI:  Body mass index is 21.64 kg/(m^2).  Estimated Nutritional Needs:  Kcal:  1650-1850  Protein:  85-95 g  Fluid:  1.7-1.9 L/day     Skin:  Wound (see comment)  Diet Order:  Diet Carb Modified Fluid consistency:: Thin; Room service appropriate?: Yes  EDUCATION NEEDS:  Education needs addressed   Intake/Output Summary (Last 24 hours) at 07/13/14 0956 Last data filed at 07/13/14 0614  Gross per 24 hour  Intake  540 ml  Output    650 ml  Net   -110 ml    Last BM:  5/13  Clayton Bibles, MS, RD, LDN Pager: [phone] After Hours Pager: [phone]

## 2014-07-14 ENCOUNTER — Ambulatory Visit
Admit: 2014-07-14 | Discharge: 2014-07-14 | Disposition: A | Discharge status: Home / Self Care | Payer: Medicare Other | Attending: Radiation Oncology | Admitting: Radiation Oncology

## 2014-07-14 ENCOUNTER — Inpatient Hospital Stay (HOSPITAL_COMMUNITY): Payer: BC Managed Care – PPO

## 2014-07-14 ENCOUNTER — Ambulatory Visit
Admit: 2014-07-14 | Discharge: 2014-07-14 | Disposition: A | Discharge status: Home / Self Care | Payer: BC Managed Care – PPO | Attending: Radiation Oncology | Admitting: Radiation Oncology

## 2014-07-14 DIAGNOSIS — E079 Disorder of thyroid, unspecified: Secondary | ICD-10-CM

## 2014-07-14 DIAGNOSIS — C7951 Secondary malignant neoplasm of bone: Secondary | ICD-10-CM

## 2014-07-14 DIAGNOSIS — B379 Candidiasis, unspecified: Secondary | ICD-10-CM

## 2014-07-14 LAB — CBC
HEMATOCRIT: 26.2 % — AB (ref 39.0–52.0)
Hemoglobin: 8.8 g/dL — ABNORMAL LOW (ref 13.0–17.0)
MCH: 31.8 pg (ref 26.0–34.0)
MCHC: 33.6 g/dL (ref 30.0–36.0)
MCV: 94.6 fL (ref 78.0–100.0)
Platelets: 150 10*3/uL (ref 150–400)
RBC: 2.77 MIL/uL — AB (ref 4.22–5.81)
RDW: 14.3 % (ref 11.5–15.5)
WBC: 25.5 10*3/uL — AB (ref 4.0–10.5)

## 2014-07-14 LAB — BASIC METABOLIC PANEL
Anion gap: 8 (ref 5–15)
BUN: 19 mg/dL (ref 6–20)
CHLORIDE: 103 mmol/L (ref 101–111)
CO2: 23 mmol/L (ref 22–32)
Calcium: 7.3 mg/dL — ABNORMAL LOW (ref 8.9–10.3)
Creatinine, Ser: 0.41 mg/dL — ABNORMAL LOW (ref 0.61–1.24)
GFR calc Af Amer: >60 mL/min (ref >60)
GFR calc non Af Amer: >60 mL/min (ref >60)
GLUCOSE: 108 mg/dL — AB (ref 65–99)
Potassium: 3.7 mmol/L (ref 3.5–5.1)
SODIUM: 134 mmol/L — AB (ref 135–145)

## 2014-07-14 LAB — T4, FREE: Free T4: 2.23 ng/dL — ABNORMAL HIGH (ref 0.61–1.12)

## 2014-07-14 LAB — GLUCOSE, CAPILLARY: Glucose-Capillary: 97 mg/dL (ref 65–99)

## 2014-07-14 LAB — HEPARIN LEVEL (UNFRACTIONATED): Heparin Unfractionated: 0.48 IU/mL (ref 0.30–0.70)

## 2014-07-14 MED ORDER — HYDRALAZINE HCL 10 MG PO TABS
10.0000 mg | ORAL_TABLET | Freq: Three times a day (TID) | ORAL | Status: DC
  Administered 2014-07-14 – 2014-07-18 (×14): 10 mg via ORAL
  Filled 2014-07-14 (×17): qty 1

## 2014-07-14 MED ORDER — FLUCONAZOLE 100 MG PO TABS
100.0000 mg | ORAL_TABLET | Freq: Every day | ORAL | Status: DC
  Administered 2014-07-14 – 2014-07-18 (×5): 100 mg via ORAL
  Filled 2014-07-14 (×5): qty 1

## 2014-07-14 MED ORDER — GADOBENATE DIMEGLUMINE 529 MG/ML IV SOLN
13.0000 mL | Freq: Once | INTRAVENOUS | Status: AC | PRN
  Administered 2014-07-14: 13 mL via INTRAVENOUS

## 2014-07-14 MED ORDER — ENOXAPARIN SODIUM 80 MG/0.8ML ~~LOC~~ SOLN
1.0000 mg/kg | Freq: Two times a day (BID) | SUBCUTANEOUS | Status: DC
  Administered 2014-07-14 (×2): 65 mg via SUBCUTANEOUS
  Filled 2014-07-14 (×5): qty 0.8

## 2014-07-14 NOTE — Progress Notes (Signed)
[facility] Therapy Treatment Record Phone [phone]   Radiation Therapy was administered to [name] [name] on: 07/14/2014  6:38 PM and was treatment # 3 out of a planned course of 10 treatments.

## 2014-07-14 NOTE — Progress Notes (Signed)
CC: Dr. Burney Gauze  Weekly Management Note:  Site: Neck, LS-spine/pelvis, right hip/pelvis, right rib cage, left rib cage Current Dose:  900  cGy to neck, and lumbar spine/pelvis.  800 cGy to the right rib, left rib, and right hip/pelvis (now completed) Projected Dose: 3000  cGy to the neck and lumbar spine/pelvis.  Narrative: The patient is seen today for routine under treatment assessment. CBCT/MVCT images/port films were reviewed. The chart was reviewed.  The patient had a MRI scan of his LS-spine today.  This showed progression of disease particularly along the lower lumbar spine.  In addition, there is new epidural hematoma along the dorsal aspect of the canal extending from the mid L3 to the T12 level.  This contributes to thecal sac narrowing.  The superior extent was not imaged and a thoracic/cervical spine MR was suggested for further delineation of disease.  Dr. 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  The patient states that he does have bladder and bowel control.  He is nonambulatory.   Physical Examination: There were no vitals filed for this visit..  Weight:  .  There is weakness of both distal lower extremities, right greater than left. He has numbness along his distal lower extremities, left and right but no loss of sensation above  above L2/L3.  Sensation is normal to light touch along the lower abdomen.   Impression: Metastatic carcinoma which has clearly progressed since his initial MR scan from 07/04/2014.  The progression may have taken place before he started radiation therapy.  I explained to the patient that he may very well have disease above where we are treating and a MRI scan of the thoracic spine would be helpful in determining how far we should increase the superior aspect of his field.  His disease is  obviously progressing rapidly and his prognosis is poor.  I feel that he is symptomatic from disease that is currently within his treatment field.  Of note is that the patient has unrealistic expectations, and he wonders if he can still be "cured".  I explained to him that we are simply try to palliate his disease.  I think he now understands that we may not be successful, and he may never walk again.  He may need to be evaluated by Palliative Care.  I defer to Dr. Marin Olp.   Plan: Continue radiation therapy as planned.  I will contact his hospitalist to see if they can go ahead and get a MRI scan of his thoracic spine.  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

## 2014-07-14 NOTE — Progress Notes (Signed)
Chart note: Please refer to my weekly management note from earlier today.  He will need a thoracic spine MR scan to further delineate his disease which extends up to the T12 vertebra, above the superior aspect of Dr. Pearlie Oyster current treatment field which extends from L2 through the sacrum.

## 2014-07-14 NOTE — Progress Notes (Signed)
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 He was seen by Dr. Buddy Duty of endocrinology. I very much appreciate his input.  [name] thyroid is doing much better. He is eating regular food now. We have to keep him taking protein. He does not like Ensure. I would discontinue this.  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 We will send it off for genetic analysis.  He is on heparin. I want to go ahead and discontinue this and put him on Lovenox. This way, the IV in his left arm can be discontinued.  He still having issues with his right leg. He still cannot put any pressure on the right leg. There is weakness. I'm sure this is from his metastatic disease. Radiation is hopefully going to take care of this.  I still feel that he has metastatic lung cancer. I think the pattern of disease metastasis is consistent with metastatic lung cancer.  He's had no problems with bowels or bladder. He has to go to the bathroom while in the bed because cannot get up.  He needs a ton of physical therapy.they are working with him right now. I think they're doing a great job.  His labs look okay.he is still somewhat anemic. Hemoglobin is 8.8. His white cell count is elevated, mostly because of his Decadron use. I think he may be starting to get a little bit of thrush. We will have to put him on some Diflucan for this.  His physical exam is pretty much unchanged. All his vital signs are stable. Blood pressure is up a little bit. His lungs are clear. Cardiac exam regular rate and rhythm with no murmurs, rubs or bruits. Abdomen is soft. Bowel sounds present. There is no fluid wave. His extremities shows some slight nonpitting edema of the left arm. He has no change in the weakness in dorsiflexion of the right foot.  For now, he'll continue his radiation therapy.I just don't believe that he is ready for discharge at all. He still is on IV antibiotics as far as I can tell.  I wonder if he would be a candidatet for inpatient  rehabilitation.  I appreciate all the outstanding care that he is getting from everybody on 5 E.!!  Stormy Card 3:23

## 2014-07-14 NOTE — Progress Notes (Signed)
OT Cancellation Note  Patient Details Name: [name] [name] MRN: [id number] DOB: 1940-06-20   Cancelled Treatment:    Reason Eval/Treat Not Completed: Patient at procedure or test/ unavailable  Will check back next day   Carlyon Shadow, [place] [phone] 07/14/2014, 2:09 PM

## 2014-07-14 NOTE — Progress Notes (Addendum)
Patient ID: [name] [name], male   DOB: [birth year], 74 y.o.   MRN: [id number]  [facility] PROGRESS NOTE  [name] [name]:[id number] DOB: 1940/04/27 DOA: 07/04/2014 PCP: [name], MD   Brief narrative:    74 year old male with rheumatoid arthritis and hypertension, presented to [facility] ED with main concern of 3 weeks duration of bilateral chest pain and neck swelling, dyspnea with exertion and at rest, dysphagia and odynophagia, hemoptysis. PCP requested CT scan which was notable for massive thyroid gland with adjacent phlegmon, L>R, necrotic cervical lymphadenopathy c/w metastatic disease and possible acute suppurative thyroiditis. CT chest concerning for acute versus chronic, infectious versus inflammatory process, lytic lesions seen in spine and ribs.   Seen by oncology, ENT and endocrinology. Primary thyroid malignancy felt to be somewhat unusual and lymphoma possible consideration. Patient underwent MRI which found incidentally subdural hematoma. Patient has been started on low-dose anticoagulation, also started on IV steroids. He underwent biopsy with tissue results noting an adenocarcinoma that can be seen with either lung or thyroid consistent with metastatic disease. Patient also noted to be aspirating during barium swallow study on 5/9. Started on broad-spectrum IV antibiotics.  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 pain somewhat controlled - continue PO steroids - radiation treatment started May 11th, 2016, continue under the radiation oncology team guidance  - MRI of the lumbar spine notable for progression of the metastatic disease - pt will need MRI of the thoracic spine as well, order place   Acute on chronic cancer related pain - in multiple sites throughout the body: upper and lower back area, bilateral LE's and bilateral UE's - pt  with persistent pain, progression of the disease note in the lumbar spine  - continue analgesia as needed  - also continuing with radiation treatments  Lower extremity numbness and weakness, acute and worse 6/96 - certainly worrisome for lower back cord compression - d/w Dr. Isidore Moos, plan to continue with radiation therapy to decrease the change of progressive neurological dysfunction - Again, MRI of the lumbar spine with progressive metastatic disease, rather guarded prognosis - also continue analgesia as needed    Active Problems:  Rheumatoid arthritis - Plaquenil and methotrexate on hold.    HTN, accelerated - likely from underlying acute pain - continue ACEI and beta blocker - added hydralazine as needed for better BP control    Lung nodule - per oncologist, likely the primary source of the metastatic disease - appreciate oncologist help    Thrombosis of left subclavian vein, new epidural hematoma from L3 - T12 on MRI lumbar spine 5/16 - With subdural hematomas, case discussed with neurosurgery by my colleague Dr. Maryland Pink - patient has a high clot risk given thrombus and now MRI lumbar spine 5/16 with new epidural hematoma dorsal aspect of the canal extending from mid L3 to the T12 level with maximal thickness at the L2 and L3 level measuring up to 6.5 mm AP dimension.  - started low dose AC given high risk for thrombosis and now with hematoma - very difficult situation, will discuss this with Dr. Jonette Eva as pt already transitioned to Levaquin  - Hg has remained stable with no signs of beleeding     Metabolic acidosis with hypokalemia  - provided IVF, pt has responded well and now this has resolved and is stable, at target range this AM   Leukocytosis - ikely  stress margination plus secondary to steroids. - slowly trending down  - will repeat CBC in AM   Dysphagia - Felt to be secondary to enlarged thyroid - able to tolerate soft diet    Poor dentition - Dental  consult requested, no further work up at this time    Anemia of chronic disease, malignancy - Hg remains stable with no signs of bleeding - repeat CBC in AM   Acute respiratory failure secondary to pleural effusion, dysphagia, aspiration PNA - pleural effusion, ? 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 Thyroid studies noted suppressed TSH and elevated free T4/T3 - started on methimazole plus Decadron per endocrinologist - [place] noted enlarged inhomogenous thyroid gland with calcification a single area in right lobe, no discrete thyroid mass. - biopsy of the left lymph node in the neck from 5/9 confirms metastatic adenocarcinoma  - appreciate endocrinologist Dr. Cindra Eves help    SDH (subdural hematoma) with spinal cord hematoma new on MRI lumbar spine as noted above - low dose AC as noted above but very difficult decision to make - will discuss with oncologist    Severe PCM - in the context of acute illness - still very poor oral intake - nutritionist to be consulted   Code Status: Full code  Family Communication: Discussed with son at bedside at length, understand guarded prognosis and further plan in management. Waiting for pt's wife to continue our goals of care discussion. Emotional support provided to son and pt, both appreciated our time and concern. I encouraged family to ask questions with no hesitation. Also explained that we can provide interpreter for better communication if needed.   Disposition Plan: Barrier to discharge - pt still on IV ABX, cancer work up still in progress, given worsening LE weakness, cord compression must be ruled out but radiation therapy needs to be done  today at the expense of doing an MRI. D/W Dr. Isidore Moos, we can proceed with MRI likely in AM.  IV access:  Peripheral IV  Procedures and diagnostic studies:    CXR  07/06/2014  Stable bilateral pleural effusions and associated atelectasis. 2. Stable changes of COPD and borderline cardiomegaly.     Ct Head Wo Contrast  07/10/2014  Stable appearance of posterior and medial subdural hematoma is. 2. Stable appearance of anterior left frontal extra-axial fluid. This may represent hematoma or less likely tumor. 3. Stable appearance of infiltrative disease along the posterior medial left frontal lobe near the vertex.     Ct Head Wo Contrast   07/07/2014  Mild diffuse cortical atrophy. Mild chronic ischemic white matter disease.  Left frontal and posterior fossa subdural hematomas are noted as described on prior MRI.  High density material is noted in left parafalcine region for the vertex which may represent hemorrhage or leptomeningeal neoplastic disease as described on prior MRI.     Ct Soft Tissue Neck W Contrast  07/03/2014  Massive thyroid gland enlargement with adjacent phlegmon. LEFT-greater-than- RIGHT necrotic cervical lymphadenopathy. 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 Acute suppurative thyroiditis is in the differential considerations. 2. Thrombus at the junction of the LEFT  subclavian vein and LEFT brachiocephalic vein. 3. Markedly carious dentition with osteolysis in the RIGHT mandibular body. This may be odontogenic or metastatic.     Ct Chest Wo Contrast  07/03/2014  There are coarse reticular opacities with intervening hazy ground-glass opacity and associated if edematous cystic change in the right upper lobe. This could all be chronic. Acute infectious or inflammatory infiltrate is possible. 2. No discrete pulmonary mass. 4 mm pleural-based nodule noted in the  peripheral right upper lobe. No other suspicious nodules 3. Small, right greater than left, pleural effusions with associated dependent atelectasis. No pulmonary edema. 4. Neck base abnormalities. Patient also underwent neck CT. There is ill-defined soft tissue that involves and surrounds the thyroid gland. The thyroid appears enlarged and heterogeneous. There are multiple prominent neck base lymph nodes. 5. There are lytic lesions involving 3 discrete ribs. The left fifth rib also has associated soft tissue mass. This is highly suspicious for metastatic disease to bone.    Mr Jeri Cos Wo Contrast  07/06/2014  Left side subdural hematomas in both the posterior fossa and along the left hemisphere, appear to be new since the neck CT on 07/03/2014. 2. Abnormal appearance of a 3-4 cm area of the pachy- and leptomeninges at the left vertex over the left superior frontal gyrus. 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 In the absence of a known malignancy, the differential diagnosis of this dural abnormality would include a dural vascular malformation. 3. No cerebral or cerebellar edema. No significant intracranial mass effect at this time.   Mr Lumbar Spine Wo Contrast  07/04/2014  Findings consistent with multifocal metastatic disease. 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 Most notably affected levels are from L3 to L5-S1 level.  Findings were discussed with Dr. Carmin Muskrat at the time of interpretation.     Nm Bone Scan Whole Body  07/08/2014  Abnormal increased bilateral rib activity compatible with metastatic disease. No other suspicious bony abnormalities identified.  Focus of increased  activity within the lateral soft tissues of the upper left arm -indeterminate. Correlate clinically.   US Soft Tissue Head/neck  07/06/2014  Enlarged inhomogeneous thyroid gland with a single nonspecific calcification in the RIGHT lobe.  No discrete thyroid mass identified.  Scattered cervical lymph nodes normal to mildly enlarged in sizes.   Ct Abdomen Pelvis W Contrast  07/05/2014 No acute finding within the abdomen pelvis. 2. No extraosseous metastatic disease or evidence of a primary neoplasm. 3. 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 The visible vertebrae show a subtle heterogeneous pattern without a discrete osteoblastic or osteolytic lesion. 4. Moderate right and small to moderate left pleural effusions with significant associated lower lobe atelectasis. No lung base nodules.     Dg Esophagus  07/07/2014  Suboptimal exam due to immediate aspiration, which elicited a cough reflex. No esophageal mass or stricture  US Biopsy  07/11/2014   Technically successful ultrasound guided right cervical lymph node biopsy   US Biopsy    Status post ultrasound-guided biopsy of left cervical lymph nodes.  CXR  07/09/2014  Right arm PICC tip in the SVC  Right upper lobe airspace disease may represent edema or pneumonia.  CXR 07/07/2014  Left lower lobe airspace consolidation.. Bilateral pleural effusions and interstitial edema consistent with pneumonia.    Mr Lumbar Spine W Wo Contrast  07/14/2014  New epidural hematoma dorsal aspect of the canal extending from mid L3 to the T12 level with maximal thickness at the L2 and L3 level measuring up to 6.5 mm AP dimension. This contributes to thecal sac narrowing with anterior displacement of the contained nerve roots most notable L2 level. The superior extent of this epidural hematoma was not imaged on the present exam. Thoracic/cervical spine MR can be obtained for further delineation if clinically desired.  Diffuse osseous  metastatic disease involving all visualized osseous structures. This is most notable involving the L4 and L5 vertebral body with progressive osseous metastatic disease as best appreciated posterior aspect of the L3 vertebral body. Epidural extension of tumor greatest at the L3-4 through L5 pedicle level with extension into the right L3-4 and L4-5 and left L4-5 neural foramen. Extension of tumor into the psoas muscles and paraspinal small slit greater on the right. Degenerative changes combined with epidural tumor causes L3-4 multifactorial marked thecal sac narrowing, L4-5 severe thecal sac narrowing and L5-S1 mild to moderate thecal sac narrowing  Medical Consultants:    Oncology  ENT  Endocrinology  Pulmonary  Interventional radiology  IAnti-Infectives:     IV Zosyn 5/9-present  IV vancomycin 5/10-present  Faye Ramsay, MD  [facility] Pager [phone]  If 7PM-7AM, please contact night-coverage [email or web address] Password [email or web address] 07/14/2014, 6:45 AM   LOS: 10 days   HPI/Subjective: No events overnight. Still with LE weakness.  Objective: Filed Vitals:   07/13/14 1621 07/13/14 2100 07/13/14 2225 07/14/14 0403  BP: 146/83 178/72 168/61 178/73  Pulse: 74 64  64  Temp:  98.1 F (36.7 C)  97.8 F (36.6 C)  TempSrc:  Oral  Oral  Resp:  18  18  Height:      Weight:      SpO2:  96%  96%    Intake/Output Summary (Last 24 hours) at 07/14/14 0645 Last data filed at 07/14/14 0500  Gross per 24 hour  Intake 3223.8 ml  Output    375 ml  Net 2848.8 ml    Exam:   General:  Pt is alert, follows commands appropriately, not in acute distress  Cardiovascular: Regular rate and rhythm,  no rubs, no gallops  Respiratory: Clear to auscultation bilaterally, diminished breath sounds at bases with crackles   Abdomen: Soft, non tender, non distended, bowel sounds present, no guarding  Extremities: pulses DP and PT palpable bilaterally  Data Reviewed: Basic Metabolic  Panel:  Recent Labs Lab 07/08/14 0400 07/09/14 0340 07/10/14 1336 07/11/14 0530 07/12/14 0540 07/13/14 0502 07/14/14 0353  NA 137 137 136  --  138 137 134*  K 4.2 4.4 3.3*  --  3.7 3.8 3.7  CL 108 107 115*  --  107 106 103  CO2 20* 20* 18*  --  '24 23 23  '$ GLUCOSE 112* 114* 111*  --  117* 117* 108*  BUN 33* 35* 34*  --  31* 26* 19  CREATININE 0.50* 0.51* 0.36* 0.42* 0.47* 0.42* 0.41*  CALCIUM 7.9* 7.8* 6.7*  --  7.2* 7.4* 7.3*  MG 2.4 2.5*  --   --   --   --   --    Liver Function Tests:  Recent Labs Lab 07/08/14 0400 07/09/14 0340 07/10/14 1336  AST 41 37 33  ALT 46 51 50  ALKPHOS 59 59 55  BILITOT 1.0 1.1 0.9  PROT 5.3* 5.3* 4.7*  ALBUMIN 2.7* 2.7* 2.5*   CBC:  Recent Labs Lab 07/08/14 0400  07/10/14 1336 07/11/14 0530 07/12/14 0540 07/13/14 0502 07/14/14 0353  WBC 23.2*  < > 29.4* 27.3* 26.0* 27.2* 25.5*  NEUTROABS 19.8*  --  26.0*  --   --   --   --   HGB 9.0*  < > 8.6* 9.1* 9.0* 8.8* 8.8*  HCT 27.4*  < > 26.7* 27.9* 27.3* 26.8* 26.2*  MCV 94.5  < > 96.0 95.9 95.1 94.7 94.6  PLT 167  < > 189 195 180 166 150  < > = values in this interval not displayed. CBG:  Recent Labs Lab 07/09/14 0747 07/10/14 0818 07/11/14 0751 07/12/14 0746 07/13/14 0728  GLUCAP 100* 169* 108* 118* 121*    Recent Results (from the past 240 hour(s))  Culture, blood (routine x 2)     Status: None   Collection Time: 07/04/14  1:54 PM  Result Value Ref Range Status   Specimen Description BLOOD LEFT ANTECUBITAL  Final   Special Requests BOTTLES DRAWN AEROBIC AND ANAEROBIC 5 CC EA  Final   Culture   Final    NO GROWTH 5 DAYS Performed at [facility]    Report Status 07/10/2014 FINAL  Final  Culture, blood (routine x 2)     Status: None   Collection Time: 07/04/14  1:55 PM  Result Value Ref Range Status   Specimen Description BLOOD BLOOD RIGHT FOREARM  Final   Special Requests BOTTLES DRAWN AEROBIC AND ANAEROBIC 5 CC EA  Final   Culture   Final    NO GROWTH 5  DAYS Performed at [facility]    Report Status 07/10/2014 FINAL  Final  Culture, Urine     Status: None   Collection Time: 07/04/14  1:59 PM  Result Value Ref Range Status   Specimen Description URINE, RANDOM  Final   Special Requests NONE  Final   Colony Count NO GROWTH Performed at [facility]   Final   Report Status 07/05/2014 FINAL  Final  MRSA PCR Screening     Status: None   Collection Time: 07/04/14  2:42 PM  Result Value Ref Range Status   MRSA by PCR NEGATIVE NEGATIVE Final     Scheduled Meds: . acetaminophen  500 mg Oral TID  . antiseptic oral rinse  7 mL Mouth Rinse BID  . benazepril  40 mg Oral Daily  . budesonide-formoterol  2 puff Inhalation BID  . dexamethasone  8 mg Oral Q12H  . docusate  100 mg Oral BID  . feeding supplement (ENSURE ENLIVE)  237 mL Oral BID BM  . feeding supplement (RESOURCE BREEZE)  1 Container Oral BID BM  . guaiFENesin  1,200 mg Oral BID  . hydrALAZINE  10 mg Oral 3 times per day  . methimazole  60 mg Oral Daily  . OxyCODONE  15 mg Oral Q12H  . pantoprazole  40 mg Oral BID  . piperacillin-tazobactam (ZOSYN)  IV  3.375 g Intravenous Q8H  . propranolol  60 mg Oral QID  . sodium chloride  10-40 mL Intracatheter Q12H  . sodium chloride  3 mL Intravenous Q12H  . vancomycin  750 mg Intravenous Q8H   Continuous Infusions: . heparin 1,250 Units/hr (07/14/14 0000)

## 2014-07-14 NOTE — Progress Notes (Signed)
PT Cancellation Note  Patient Details Name: [name] [name] MRN: [id number] DOB: [birth year]   Cancelled Treatment:    Reason Eval/Treat Not Completed: Patient at procedure or test/unavailable   Claretha Cooper 07/14/2014, 2:32 PM Tresa Endo PT [phone]

## 2014-07-14 NOTE — Progress Notes (Addendum)
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 Other     Catfish + IV pain medication (unknown name)-vomits    Patient Measurements: Height: '5\' 7"'$  (170.2 cm) Weight: 138 lb 3.7 oz (62.7 kg) IBW/kg (Calculated) : 66.1 Heparin Dosing Weight:   Vital Signs: Temp: 97.8 F (36.6 C) (05/16 0403) Temp Source: Oral (05/16 0403) BP: 178/73 mmHg (05/16 0403) Pulse Rate: 64 (05/16 0403)  Labs:  Recent Labs  07/12/14 0540 07/12/14 1550 07/13/14 0500 07/13/14 0502 07/14/14 0353  HGB 9.0*  --   --  8.8* 8.8*  HCT 27.3*  --   --  26.8* 26.2*  PLT 180  --   --  166 150  HEPARINUNFRC 0.53 0.45 0.46  --  0.48  CREATININE 0.47*  --   --  0.42* 0.41*    Estimated Creatinine Clearance: 75.1 mL/min (by C-G formula based on Cr of 0.41).   Medications:  Infusions:  . heparin 1,250 Units/hr (07/14/14 0000)    Assessment: Patient with heparin level at goal again.  No issues noted.  Goal of Therapy:  Heparin level 0.3-0.5 units/ml Monitor platelets by anticoagulation protocol: Yes   Plan:  Continue heparin drip at current rate Recheck level with 5/17 AM labs  Ben Avon 07/14/2014, 7:17 AM Pager [phone]  UPDATE: Discontinue heparin drip and labs Start therapeutic enoxaparin per Dr. Jacqulyn Cane RPh 07/14/2014, 9:35 AM Pager [phone]

## 2014-07-15 ENCOUNTER — Other Ambulatory Visit (HOSPITAL_COMMUNITY): Payer: BC Managed Care – PPO

## 2014-07-15 ENCOUNTER — Ambulatory Visit
Admit: 2014-07-15 | Discharge: 2014-07-15 | Disposition: A | Discharge status: Home / Self Care | Payer: BC Managed Care – PPO | Attending: Radiation Oncology | Admitting: Radiation Oncology

## 2014-07-15 ENCOUNTER — Ambulatory Visit: Payer: BC Managed Care – PPO | Admitting: Family

## 2014-07-15 DIAGNOSIS — C7800 Secondary malignant neoplasm of unspecified lung: Secondary | ICD-10-CM

## 2014-07-15 DIAGNOSIS — I82B19 Acute embolism and thrombosis of unspecified subclavian vein: Secondary | ICD-10-CM

## 2014-07-15 DIAGNOSIS — R58 Hemorrhage, not elsewhere classified: Secondary | ICD-10-CM

## 2014-07-15 LAB — BASIC METABOLIC PANEL
ANION GAP: 10 (ref 5–15)
BUN: 26 mg/dL — ABNORMAL HIGH (ref 6–20)
CALCIUM: 7.4 mg/dL — AB (ref 8.9–10.3)
CO2: 21 mmol/L — ABNORMAL LOW (ref 22–32)
CREATININE: 0.48 mg/dL — AB (ref 0.61–1.24)
Chloride: 102 mmol/L (ref 101–111)
GFR calc Af Amer: >60 mL/min (ref >60)
GFR calc non Af Amer: >60 mL/min (ref >60)
Glucose, Bld: 116 mg/dL — ABNORMAL HIGH (ref 65–99)
Potassium: 3.9 mmol/L (ref 3.5–5.1)
SODIUM: 133 mmol/L — AB (ref 135–145)

## 2014-07-15 LAB — CBC
HCT: 27.5 % — ABNORMAL LOW (ref 39.0–52.0)
Hemoglobin: 9.2 g/dL — ABNORMAL LOW (ref 13.0–17.0)
MCH: 31.7 pg (ref 26.0–34.0)
MCHC: 33.5 g/dL (ref 30.0–36.0)
MCV: 94.8 fL (ref 78.0–100.0)
PLATELETS: 153 10*3/uL (ref 150–400)
RBC: 2.9 MIL/uL — AB (ref 4.22–5.81)
RDW: 14.6 % (ref 11.5–15.5)
WBC: 24 10*3/uL — ABNORMAL HIGH (ref 4.0–10.5)

## 2014-07-15 LAB — GLUCOSE, CAPILLARY: Glucose-Capillary: 125 mg/dL — ABNORMAL HIGH (ref 65–99)

## 2014-07-15 MED ORDER — HYDROMORPHONE HCL 1 MG/ML IJ SOLN
1.0000 mg | INTRAMUSCULAR | Status: DC
  Administered 2014-07-15 – 2014-07-18 (×5): 1 mg via INTRAVENOUS
  Filled 2014-07-15 (×4): qty 1

## 2014-07-15 NOTE — Progress Notes (Addendum)
Patient ID: [name] [name], male   DOB: 02/26/1941, 74 y.o.   MRN: [id number]  [facility] PROGRESS NOTE  [name] [name]:[id number] DOB: Jul 19, 1940 DOA: 07/04/2014 PCP: [name], MD   Brief narrative:    74 year old male with rheumatoid arthritis and hypertension, presented to [facility] ED with main concern of 3 weeks duration of bilateral chest pain and neck swelling, dyspnea with exertion and at rest, dysphagia and odynophagia, hemoptysis. PCP requested CT scan which was notable for massive thyroid gland with adjacent phlegmon, L>R, necrotic cervical lymphadenopathy c/w metastatic disease and possible acute suppurative thyroiditis. CT chest concerning for acute versus chronic, infectious versus inflammatory process, lytic lesions seen in spine and ribs.   Seen by oncology, ENT and endocrinology. Primary thyroid malignancy felt to be somewhat unusual and lymphoma possible consideration. Patient underwent MRI which found incidentally subdural hematoma. Patient has been started on low-dose anticoagulation, also started on IV steroids after discussion with neurosurgeon. He underwent biopsy with tissue results noting an adenocarcinoma that can be seen with either lung or thyroid consistent with metastatic disease. Patient also noted to be aspirating during barium swallow study on 5/9. Started on broad-spectrum IV antibiotics.  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  - with extensive epidural and right paraspinous tumor, resulting in central canal narrowing and encroachment on both descending and exiting nerve roots, multiple bone metastases: ribs  and spine (on first MRI of the lumbar spine 07/04/2014) - repeat MRI of the lumbar spine 07/14/2014 notable for progression of metastatic disease, with new epidural hematoma extending from L3 -  T12 level with diffuse osseous metastatic disease - these findings were discussed with patient in detail, patient inquiring about second opinion at [facility] - we'll discuss with Dr. Jonette Eva  - pt still with significant weakness mostly in lower extremities  - please note that patient started with radiation treatment of 07/10/2014 and this is scheduled to continue until 07/23/2014, patient is receiving radiation therapy to neck and lumbar spine at this time under the care of Dr. Isidore Moos - Recommendation is to proceed with MRI of the thoracic spine to further delineate the extent of disease and offer palliative treatment if needed - appreciated radiation oncology assistance  Acute on chronic cancer related pain - in multiple sites throughout the body: upper and lower back area, bilateral LE's and bilateral UE's - pt with persistent pain, progression of the disease noted in the lumbar spine  - continue analgesia as needed  - also continuing with radiation treatments as noted above  Lower extremity numbness and weakness, acute and worse 5/13 - d/w Dr. Isidore Moos, plan is to continue with radiation therapy until 07/23/2014 - Patient currently receiving radiation therapy to neck and lumbar spine area, further radiation treatment plan depending on MRI of the thoracic spine - also continue analgesia as needed    Active Problems:  Rheumatoid arthritis - Plaquenil and methotrexate on hold.    HTN, accelerated - likely from underlying acute pain - continue ACEI and beta blocker - added hydralazine as needed for better BP control    Lung nodule - per oncologist, likely the primary source of the metastatic  disease - appreciate oncologist help  - patient is inquiring about second opinion at [facility]   Thrombosis of left subclavian vein, new epidural hematoma from L3 - T12 on MRI lumbar spine 5/16 - With subdural hematomas, case discussed with neurosurgery by my colleague Dr. Maryland Pink - patient has a high clot risk given thrombus and now MRI lumbar spine 5/16 with new epidural hematoma dorsal aspect of the canal extending from mid L3 to the T12 level with maximal thickness at the L2 and L3 level measuring up to 6.5 mm AP dimension.  - started low dose AC given high risk for thrombosis and now with hematoma, Lovenox must be discontinued 5/16 - very difficult situation - Hg has remained stable with no signs of beleeding     Metabolic acidosis with hypokalemia  - provided IVF, pt has responded well, Will repeat BMP in the morning   Leukocytosis - ikely stress margination plus secondary to steroids. - slowly trending down  - will repeat CBC in AM   Dysphagia - Felt to be secondary to enlarged thyroid - able to tolerate soft diet    Poor dentition - Dental consult requested, no further work up at this time    Anemia of chronic disease, malignancy - Hg remains stable with no signs of bleeding - repeat CBC in AM   Acute respiratory failure secondary to pleural effusion, dysphagia, aspiration PNA - pleural effusion, ? 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 Thyroid studies noted suppressed TSH and elevated free T4/T3 - started on methimazole plus Decadron per endocrinologist, also on Propranolol  - [place] noted enlarged inhomogenous thyroid gland with calcification a single area in right lobe, no discrete thyroid mass. - biopsy of the left lymph node  in the neck from 5/9 confirms metastatic adenocarcinoma  - appreciate endocrinologist Dr. Cindra Eves help, he is recommendation is to continue current regimen with no indications for additional intervention   SDH (subdural hematoma) with spinal cord hematoma new on MRI lumbar spine as noted above - was initially on low-dose anticoagulation but this has been discontinued due to new spinal cord hematoma on lumbar MRI 07/14/2014   Severe PCM - in the context of acute illness - still very poor oral intake  Code Status: Full code  Family Communication: Discussed with son at bedside at length, understand guarded prognosis and further plan in management. Waiting for pt's wife to continue our goals of care discussion. Emotional support provided to son and pt, both appreciated our time and concern. I encouraged family to ask questions with no hesitation. Also explained that we can provide interpreter for better communication if needed.   Disposition Plan: Barrier to discharge - pt still on IV ABX, cancer work up still in progress, given worsening LE weakness, cord compression must be ruled out but radiation therapy needs to be done today at the expense of doing an MRI. D/W Dr. Isidore Moos, we can proceed with MRI likely in AM.  IV access:  Peripheral IV  Procedures and diagnostic studies:    CXR  07/06/2014  Stable bilateral pleural effusions and associated atelectasis. 2. Stable changes of COPD and borderline cardiomegaly.     Ct Head Wo Contrast  07/10/2014  Stable appearance of posterior and medial subdural hematoma is. 2. Stable appearance of anterior left frontal extra-axial fluid. This may represent hematoma or less likely tumor. 3. Stable appearance of infiltrative disease along the posterior medial left frontal lobe near the vertex.     Ct Head Wo Contrast   07/07/2014  Mild diffuse cortical atrophy. Mild chronic ischemic white matter disease.  Left frontal and posterior fossa subdural hematomas are noted  as described on prior MRI.  High density material is noted in left parafalcine region for the vertex which may represent hemorrhage or leptomeningeal neoplastic disease as described on prior MRI.     Ct Soft Tissue Neck W Contrast  07/03/2014  Massive thyroid gland enlargement with adjacent phlegmon. LEFT-greater-than- RIGHT necrotic cervical lymphadenopathy. 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 Acute suppurative thyroiditis is in the differential considerations. 2. 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 3. Markedly carious dentition with osteolysis in the RIGHT mandibular body. This may be odontogenic or metastatic.     Ct Chest Wo Contrast  07/03/2014  There are coarse reticular opacities with intervening hazy ground-glass opacity and associated if edematous cystic change in the right upper lobe. This could all be chronic. Acute infectious or inflammatory infiltrate is possible. 2. No discrete pulmonary mass. 4 mm pleural-based nodule noted in the peripheral right upper lobe. No other suspicious nodules 3. Small, right greater than left, pleural effusions with associated dependent atelectasis. No pulmonary edema. 4. Neck base abnormalities. Patient also underwent neck CT. There is ill-defined soft tissue that involves and surrounds the thyroid gland. The thyroid appears enlarged and heterogeneous. There are multiple prominent neck base lymph nodes. 5. There are lytic lesions involving 3 discrete ribs. The left fifth rib also has associated soft tissue mass. This is highly suspicious for metastatic disease to bone.    Mr Jeri Cos Wo Contrast  07/06/2014  Left side subdural hematomas in both the posterior fossa and along the left hemisphere, appear to be new since the neck CT on 07/03/2014. 2. Abnormal appearance of a 3-4 cm area of the  pachy- and leptomeninges at the left vertex over the left superior frontal gyrus. 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 In the absence of a known malignancy, the differential diagnosis of this dural abnormality would include a dural vascular malformation. 3. No cerebral or cerebellar edema. No significant intracranial mass effect at this time.   Mr Lumbar Spine Wo Contrast  07/04/2014  Findings consistent with multifocal metastatic disease. 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 Most notably affected levels are from L3 to L5-S1 level.  Findings were discussed with Dr. Carmin Muskrat at the time of interpretation.     Nm Bone Scan Whole Body  07/08/2014  Abnormal increased bilateral rib activity compatible with metastatic disease. No other suspicious bony abnormalities identified.  Focus of increased activity within the lateral soft tissues of the upper left arm -indeterminate. Correlate clinically.   US Soft Tissue Head/neck  07/06/2014  Enlarged inhomogeneous thyroid gland with a single nonspecific calcification in the RIGHT lobe.  No discrete thyroid mass identified.  Scattered cervical lymph nodes normal to mildly enlarged in sizes.   Ct Abdomen Pelvis W Contrast  07/05/2014 No acute finding within the abdomen pelvis. 2. No extraosseous metastatic disease or evidence of a primary neoplasm. 3. 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 The visible vertebrae show a subtle heterogeneous pattern without a discrete osteoblastic or osteolytic lesion. 4. Moderate right and small to moderate left pleural effusions with significant associated lower lobe  atelectasis. No lung base nodules.     Dg Esophagus  07/07/2014  Suboptimal exam due to immediate aspiration, which elicited a cough reflex. No esophageal mass or stricture  US Biopsy  07/11/2014   Technically successful ultrasound guided right cervical lymph node biopsy   US Biopsy    Status post ultrasound-guided biopsy of left cervical lymph nodes.  CXR  07/09/2014  Right arm PICC tip in the SVC  Right upper lobe airspace disease may represent edema or pneumonia.  CXR 07/07/2014  Left lower lobe airspace consolidation.. Bilateral pleural effusions and interstitial edema consistent with pneumonia.    Mr Lumbar Spine W Wo Contrast  07/14/2014  New epidural hematoma dorsal aspect of the canal extending from mid L3 to the T12 level with maximal thickness at the L2 and L3 level measuring up to 6.5 mm AP dimension. This contributes to thecal sac narrowing with anterior displacement of the contained nerve roots most notable L2 level. The superior extent of this epidural hematoma was not imaged on the present exam. Thoracic/cervical spine MR can be obtained for further delineation if clinically desired.  Diffuse osseous metastatic disease involving all visualized osseous structures. This is most notable involving the L4 and L5 vertebral body with progressive osseous metastatic disease as best appreciated posterior aspect of the L3 vertebral body. Epidural extension of tumor greatest at the L3-4 through L5 pedicle level with extension into the right L3-4 and L4-5 and left L4-5 neural foramen. Extension of tumor into the psoas muscles and paraspinal small slit greater on the right. Degenerative changes combined with epidural tumor causes L3-4 multifactorial marked thecal sac narrowing, L4-5 severe thecal sac narrowing and L5-S1 mild to moderate thecal sac narrowing  Medical Consultants:    Oncology  ENT  Endocrinology  Pulmonary  Interventional radiology  IAnti-Infectives:     IV Zosyn 5/9-5/17  IV  vancomycin 5/10-5/17  Faye Ramsay, MD  [facility] Pager [phone]  If 7PM-7AM, please contact night-coverage [email or web address] Password [email or web address] 07/15/2014, 2:09 PM   LOS: 11 days   HPI/Subjective: No events overnight. Still with LE weakness.  Objective: Filed Vitals:   07/14/14 2149 07/15/14 0531 07/15/14 0925 07/15/14 1343  BP: 129/59 132/63 135/68 131/72  Pulse: 70 65 72 67  Temp: 97.5 F (36.4 C) 97.2 F (36.2 C)  97.9 F (36.6 C)  TempSrc: Oral Oral  Oral  Resp: '16 18  20  '$ Height:      Weight:      SpO2: 95% 96%  97%    Intake/Output Summary (Last 24 hours) at 07/15/14 1409 Last data filed at 07/15/14 0500  Gross per 24 hour  Intake   1132 ml  Output    175 ml  Net    957 ml    Exam:   General:  Pt is alert, follows commands appropriately, not in acute distress  Cardiovascular: Regular rate and rhythm,  no rubs, no gallops  Respiratory: diminished breath sounds at bases, scattered rhonchi bilaterally  Abdomen: Soft, non tender, non distended, bowel sounds present, no guarding  Extremities: pulses DP and PT palpable bilaterally  Data Reviewed: Basic Metabolic Panel:  Recent Labs Lab 07/09/14 0340 07/10/14 1336 07/11/14 0530 07/12/14 0540 07/13/14 0502 07/14/14 0353 07/15/14 0525  NA 137 136  --  138 137 134* 133*  K 4.4 3.3*  --  3.7 3.8 3.7 3.9  CL 107 115*  --  107 106 103 102  CO2 20* 18*  --  '24 23 23 '$ 21*  GLUCOSE 114* 111*  --  117* 117* 108* 116*  BUN 35* 34*  --  31* 26* 19 26*  CREATININE 0.51* 0.36* 0.42* 0.47* 0.42* 0.41* 0.48*  CALCIUM 7.8* 6.7*  --  7.2* 7.4* 7.3* 7.4*  MG 2.5*  --   --   --   --   --   --    Liver Function Tests:  Recent Labs Lab 07/09/14 0340 07/10/14 1336  AST 37 33  ALT 51 50  ALKPHOS 59 55  BILITOT 1.1 0.9  PROT 5.3* 4.7*  ALBUMIN 2.7* 2.5*   CBC:  Recent Labs Lab 07/10/14 1336 07/11/14 0530 07/12/14 0540 07/13/14 0502 07/14/14 0353 07/15/14 0525  WBC 29.4* 27.3* 26.0* 27.2* 25.5* 24.0*   NEUTROABS 26.0*  --   --   --   --   --   HGB 8.6* 9.1* 9.0* 8.8* 8.8* 9.2*  HCT 26.7* 27.9* 27.3* 26.8* 26.2* 27.5*  MCV 96.0 95.9 95.1 94.7 94.6 94.8  PLT 189 195 180 166 150 153   CBG:  Recent Labs Lab 07/11/14 0751 07/12/14 0746 07/13/14 0728 07/14/14 0710 07/15/14 0721  GLUCAP 108* 118* 121* 97 125*    Recent Results (from the past 240 hour(s))  Culture, blood (routine x 2)     Status: None   Collection Time: 07/04/14  1:54 PM  Result Value Ref Range Status   Specimen Description BLOOD LEFT ANTECUBITAL  Final   Special Requests BOTTLES DRAWN AEROBIC AND ANAEROBIC 5 CC EA  Final   Culture   Final    NO GROWTH 5 DAYS Performed at [facility]    Report Status 07/10/2014 FINAL  Final  Culture, blood (routine x 2)     Status: None   Collection Time: 07/04/14  1:55 PM  Result Value Ref Range Status   Specimen Description BLOOD BLOOD RIGHT FOREARM  Final   Special Requests BOTTLES DRAWN AEROBIC AND ANAEROBIC 5 CC EA  Final   Culture   Final    NO GROWTH 5 DAYS Performed at [facility]    Report Status 07/10/2014 FINAL  Final  Culture, Urine     Status: None   Collection Time: 07/04/14  1:59 PM  Result Value Ref Range Status   Specimen Description URINE, RANDOM  Final   Special Requests NONE  Final   Colony Count NO GROWTH Performed at [facility]   Final   Report Status 07/05/2014 FINAL  Final  MRSA PCR Screening     Status: None   Collection Time: 07/04/14  2:42 PM  Result Value Ref Range Status   MRSA by PCR NEGATIVE NEGATIVE Final     Scheduled Meds: . acetaminophen  500 mg Oral TID  . antiseptic oral rinse  7 mL Mouth Rinse BID  . benazepril  40 mg Oral Daily  . budesonide-formoterol  2 puff Inhalation BID  . dexamethasone  8 mg Oral Q12H  . docusate  100 mg Oral BID  .  feeding supplement (RESOURCE BREEZE)  1 Container Oral BID BM  . fluconazole  100 mg Oral Daily  . guaiFENesin  1,200 mg Oral BID  . hydrALAZINE  10 mg  Oral 3 times per day  .  HYDROmorphone (DILAUDID) injection  1 mg Intravenous 30 min Pre-Op  . methimazole  60 mg Oral Daily  . OxyCODONE  15 mg Oral Q12H  . pantoprazole  40 mg Oral BID  . piperacillin-tazobactam (ZOSYN)  IV  3.375 g Intravenous Q8H  . propranolol  60 mg Oral QID  . sodium chloride  10-40 mL Intracatheter Q12H  . sodium chloride  3 mL Intravenous Q12H  . vancomycin  750 mg Intravenous Q8H   Continuous Infusions:

## 2014-07-15 NOTE — Progress Notes (Signed)
Occupational Therapy Treatment Patient Details Name: [name] [name] MRN: [id number] DOB: [birth year] Today's Date: 07/15/2014    History of present illness 74 yo male admitted 07/04/14 with CT scan and MRI of the L-spine showing multiple abnormalities consistent with metastatic malignancy. CT abdomen and pelvis showing pleural effusion right greater than left. Patient complaining of hip pain. Patient had MRI done which showed left subdural hematomas. positive for thrombosis L subclavian vein.. Patient s/p biopsy yesterday off lymph nodes results are pending. Pt has epidural mass and multiple bony mets.  5/17: Pt has subclavian DVT and epidural hematoma spine   OT comments  Pt tolerated session well.  Very motivated and anxious to stand   Follow Up Recommendations  SNF    Equipment Recommendations  3 in 1 bedside comode    Recommendations for Other Services      Precautions / Restrictions Precautions Precautions: Fall;Back (due to tumors/epidural hematoma) Precaution Comments: profound R leg weakness, will buckle, use KI for OOB Restrictions Weight Bearing Restrictions: No       Mobility Bed Mobility         Supine to sit: Mod assist;+2 for physical assistance;+2 for safety/equipment Sit to supine: Mod assist;+2 for physical assistance;+2 for safety/equipment   General bed mobility comments: HOB raised, kept spine straight  Transfers   Equipment used: Rolling walker (2 wheeled) Transfers: Sit to/from Stand Sit to Stand: Mod assist;+2 safety/equipment;+2 physical assistance         General transfer comment: RW blocked and pt pulled up from high surface; educated that normally he will push up from bed.      Balance   Sitting-balance support: Bilateral upper extremity supported;Single extremity supported Sitting balance-Leahy Scale: Fair (min guard for safety)                             ADL       Grooming: Wash/dry face;Brushing hair;Sitting;Min guard                                  General ADL Comments: min guard for safety.  Pt wanted to work on standing tolerance.  Stood 3xs with +2 mod A from high bed.  Pt maintained for approximately 30 seconds with min A, +2 for safety using KI.  Cued for standing still as pt was initially moving body.  Assisted with KI.  Pt's affect much brighter today.  Educated on back precautions      Vision                     Perception     Praxis      Cognition   Behavior During Therapy: WFL for tasks assessed/performed Overall Cognitive Status: Within Functional Limits for tasks assessed                       Extremity/Trunk Assessment               Exercises     Shoulder Instructions       General Comments      Pertinent Vitals/ Pain       Pain Assessment: No/denies pain  Home Living  Prior Functioning/Environment              Frequency Min 2X/week     Progress Toward Goals  OT Goals(current goals can now be found in the care plan section)  Progress towards OT goals: Progressing toward goals     Plan Discharge plan needs to be updated    Co-evaluation                 End of Session     Activity Tolerance Patient tolerated treatment well   Patient Left in bed;with call bell/phone within reach;with nursing/sitter in room   Nurse Communication          Time: 7591-6384 OT Time Calculation (min): 27 min  Charges: OT General Charges $OT Visit: 1 Procedure OT Treatments $Therapeutic Activity: 23-37 mins  Laia Wiley 07/15/2014, 2:55 PM  Lesle Chris, OTR/L [phone] 07/15/2014

## 2014-07-15 NOTE — Progress Notes (Signed)
Updated clinicals faxed to [facility] for continued authorization. Pt not currently ready for discharge.  Peri Maris, LCSWA 07/15/2014 6:15 PM [phone]

## 2014-07-15 NOTE — Progress Notes (Signed)
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 This is  Of his lumbar spine. This seemed to show some progression of his disease. Granted, he just started radiation therapy for this. However, it looks like he has a epidural hematoma. As such, his Lovenox will have to be stopped. I can't find anything neurologically on his exam that looks like this hematoma is causing more problems.  He says he actually feels a little better. He says that he spends a lot of time down in radiation.  His appetite seems be doing better. He sees me eating good. There is no dysphagia or odynophagia.  He's had no abdominal pain. There's been no bowel or bladder continence.  He's had no headache.  He does have the subclavian thrombus. However, I don't think that we'll be able to anticoagulate him given this epidural hematoma. I'm not sure of any count of filter would help [place] out.  His labs look okay. His potassium is 3.9. His hemoglobin is 9.2.  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  His vital signs all are pretty stable. Blood pressure is 132/63. Temperature 97.2. His lungs are clear. Cardiac exam regular rate and rhythm. Oral exam shows no mucositis or thrush. Abdomen is soft. Has good bowel sounds. There is no fluid wave. There is no palpable liver or spleen tip. Extremities shows some good range of motion and decent strength in his left leg. His right leg seems to have a little bit of better movement with his foot and dorsi flexion. He seems to move his thigh a little bit better. Skin exam shows the ecchymoses over on his right hand. Neurological exam shows no changes.  Again, we will have to stop the Lovenox.  We do get another biopsy. I am sending it off for genetic evaluation.  He will continue his radiation therapy. I appreciate radiation oncology doing all that they can do to help him out.  I I appreciate all of the great care that he is getting on [place].!!  Frederich Cha 1:5-7

## 2014-07-15 NOTE — Progress Notes (Signed)
Chart note: I spoke with Dr. Marin Olp this morning.  We are in agreement to move ahead with a MRI scan of his thoracic spine.  The superior extent of his lumbar spine treatment field may be adjusted based on his MRI scan.  Dr. Pearlie Oyster lumbar spine XRT field extends superiorly to include L2, but not T12 or L1.  As mentioned yesterday, feel that his neurologic deficit is secondary to disease along the lower lumbar spine.  I plan to review his MRI scan tomorrow when it  becomes available.

## 2014-07-16 ENCOUNTER — Inpatient Hospital Stay (HOSPITAL_COMMUNITY): Payer: BC Managed Care – PPO

## 2014-07-16 ENCOUNTER — Ambulatory Visit
Admit: 2014-07-16 | Discharge: 2014-07-16 | Disposition: A | Discharge status: Home / Self Care | Payer: BC Managed Care – PPO | Attending: Radiation Oncology | Admitting: Radiation Oncology

## 2014-07-16 DIAGNOSIS — D630 Anemia in neoplastic disease: Secondary | ICD-10-CM

## 2014-07-16 DIAGNOSIS — G893 Neoplasm related pain (acute) (chronic): Secondary | ICD-10-CM

## 2014-07-16 DIAGNOSIS — C3490 Malignant neoplasm of unspecified part of unspecified bronchus or lung: Principal | ICD-10-CM

## 2014-07-16 DIAGNOSIS — M059 Rheumatoid arthritis with rheumatoid factor, unspecified: Secondary | ICD-10-CM

## 2014-07-16 DIAGNOSIS — I621 Nontraumatic extradural hemorrhage: Secondary | ICD-10-CM

## 2014-07-16 LAB — URINALYSIS, ROUTINE W REFLEX MICROSCOPIC
Bilirubin Urine: NEGATIVE
Glucose, UA: NEGATIVE mg/dL
Hgb urine dipstick: NEGATIVE
Ketones, ur: NEGATIVE mg/dL
Leukocytes, UA: NEGATIVE
NITRITE: NEGATIVE
Protein, ur: 30 mg/dL — AB
Specific Gravity, Urine: 1.029 (ref 1.005–1.030)
UROBILINOGEN UA: 0.2 mg/dL (ref 0.0–1.0)
pH: 6 (ref 5.0–8.0)

## 2014-07-16 LAB — CBC
HCT: 26.9 % — ABNORMAL LOW (ref 39.0–52.0)
Hemoglobin: 8.8 g/dL — ABNORMAL LOW (ref 13.0–17.0)
MCH: 31.3 pg (ref 26.0–34.0)
MCHC: 32.7 g/dL (ref 30.0–36.0)
MCV: 95.7 fL (ref 78.0–100.0)
Platelets: 126 10*3/uL — ABNORMAL LOW (ref 150–400)
RBC: 2.81 MIL/uL — ABNORMAL LOW (ref 4.22–5.81)
RDW: 14.9 % (ref 11.5–15.5)
WBC: 24 10*3/uL — ABNORMAL HIGH (ref 4.0–10.5)

## 2014-07-16 LAB — BASIC METABOLIC PANEL
ANION GAP: 8 (ref 5–15)
BUN: 31 mg/dL — ABNORMAL HIGH (ref 6–20)
CALCIUM: 7.8 mg/dL — AB (ref 8.9–10.3)
CO2: 23 mmol/L (ref 22–32)
Chloride: 108 mmol/L (ref 101–111)
Creatinine, Ser: 0.43 mg/dL — ABNORMAL LOW (ref 0.61–1.24)
GFR calc Af Amer: >60 mL/min (ref >60)
Glucose, Bld: 121 mg/dL — ABNORMAL HIGH (ref 65–99)
Potassium: 3.6 mmol/L (ref 3.5–5.1)
SODIUM: 139 mmol/L (ref 135–145)

## 2014-07-16 LAB — URINE MICROSCOPIC-ADD ON

## 2014-07-16 LAB — GLUCOSE, CAPILLARY: Glucose-Capillary: 140 mg/dL — ABNORMAL HIGH (ref 65–99)

## 2014-07-16 MED ORDER — GADOBENATE DIMEGLUMINE 529 MG/ML IV SOLN
15.0000 mL | Freq: Once | INTRAVENOUS | Status: AC | PRN
  Administered 2014-07-16: 13 mL via INTRAVENOUS

## 2014-07-16 MED ORDER — IPRATROPIUM-ALBUTEROL 0.5-2.5 (3) MG/3ML IN SOLN
3.0000 mL | RESPIRATORY_TRACT | Status: DC | PRN
  Filled 2014-07-16 (×2): qty 3

## 2014-07-16 MED ORDER — MORPHINE SULFATE 2 MG/ML IJ SOLN
2.0000 mg | INTRAMUSCULAR | Status: DC | PRN
  Administered 2014-07-16 (×2): 2 mg via INTRAVENOUS
  Filled 2014-07-16 (×2): qty 1

## 2014-07-16 MED ORDER — IPRATROPIUM-ALBUTEROL 0.5-2.5 (3) MG/3ML IN SOLN
3.0000 mL | Freq: Four times a day (QID) | RESPIRATORY_TRACT | Status: DC
  Administered 2014-07-16 (×2): 3 mL via RESPIRATORY_TRACT
  Filled 2014-07-16: qty 3

## 2014-07-16 NOTE — Progress Notes (Signed)
[name] [name]   DOB:1940/06/26   HY#:[id number]   RSW#:[id number]  Patient Care Team: [name], MD as PCP - General (Internal Medicine)  Subjective: Denies fevers, chills, night sweats, vision changes, or mucositis. Denies any respiratory complaints. Denies any chest pain or palpitations. Denies lower extremity swelling. Denies nausea, heartburn or change in bowel habits.Denies any dysuria. Denies abnormal skin rashes, or neuropathy. Denies any bleeding issues such as epistaxis, hematemesis, hematuria or hematochezia.    Scheduled Meds: . acetaminophen  500 mg Oral TID  . antiseptic oral rinse  7 mL Mouth Rinse BID  . benazepril  40 mg Oral Daily  . budesonide-formoterol  2 puff Inhalation BID  . dexamethasone  8 mg Oral Q12H  . docusate  100 mg Oral BID  . feeding supplement (RESOURCE BREEZE)  1 Container Oral BID BM  . fluconazole  100 mg Oral Daily  . hydrALAZINE  10 mg Oral 3 times per day  .  HYDROmorphone (DILAUDID) injection  1 mg Intravenous 30 min Pre-Op  . methimazole  60 mg Oral Daily  . OxyCODONE  15 mg Oral Q12H  . pantoprazole  40 mg Oral BID  . propranolol  60 mg Oral QID  . sodium chloride  10-40 mL Intracatheter Q12H   Continuous Infusions:  PRN Meds:ALPRAZolam, alum & mag hydroxide-simeth, chlorproMAZINE, diphenhydrAMINE, guaiFENesin, ipratropium-albuterol, lidocaine, morphine injection, [DISCONTINUED] ondansetron **OR** ondansetron (ZOFRAN) IV, oxyCODONE, polyethylene glycol, sorbitol, temazepam, traMADol   Objective:  Filed Vitals:   07/16/14 0700  BP: 147/66  Pulse: 74  Temp: 98.3 F (36.8 C)  Resp: 20      Intake/Output Summary (Last 24 hours) at 07/16/14 0855 Last data filed at 07/16/14 0512  Gross per 24 hour  Intake      0 ml  Output    400 ml  Net   -400 ml     GENERAL:alert, no distress and comfortable SKIN: skin color, texture, turgor are normal, no rashes. Right hand ecchymoses EYES: normal, conjunctiva are pink and non-injected, sclera  clear OROPHARYNX:no exudate, no erythema and lips, buccal mucosa, and tongue normal  LUNGS: decreased breath sounds at the bases HEART: regular rate & rhythm and no murmurs and no lower extremity edema ABDOMEN: soft, non-tender and normal bowel sounds NEURO: 4/5 LLE strength, decreased right lower extremity strength  CBG (last 3)   Recent Labs  07/14/14 0710 07/15/14 0721 07/16/14 0723  GLUCAP 97 125* 140*     Labs:   Recent Labs Lab 07/10/14 1336  07/12/14 0540 07/13/14 0502 07/14/14 0353 07/15/14 0525 07/16/14 0400  WBC 29.4*  < > 26.0* 27.2* 25.5* 24.0* 24.0*  HGB 8.6*  < > 9.0* 8.8* 8.8* 9.2* 8.8*  HCT 26.7*  < > 27.3* 26.8* 26.2* 27.5* 26.9*  PLT 189  < > 180 166 150 153 126*  MCV 96.0  < > 95.1 94.7 94.6 94.8 95.7  MCH 30.9  < > 31.4 31.1 31.8 31.7 31.3  MCHC 32.2  < > 33.0 32.8 33.6 33.5 32.7  RDW 14.4  < > 14.5 14.4 14.3 14.6 14.9  LYMPHSABS 1.2  --   --   --   --   --   --   MONOABS 1.7*  --   --   --   --   --   --   EOSABS 0.4  --   --   --   --   --   --   BASOSABS 0.0  --   --   --   --   --   --   < > =  values in this interval not displayed.   Chemistries:    Recent Labs Lab 07/10/14 1336  07/12/14 0540 07/13/14 0502 07/14/14 0353 07/15/14 0525 07-21-2014 0400  NA 136  --  138 137 134* 133* 139  K 3.3*  --  3.7 3.8 3.7 3.9 3.6  CL 115*  --  107 106 103 102 108  CO2 18*  --  '24 23 23 '$ 21* 23  GLUCOSE 111*  --  117* 117* 108* 116* 121*  BUN 34*  --  31* 26* 19 26* 31*  CREATININE 0.36*  < > 0.47* 0.42* 0.41* 0.48* 0.43*  CALCIUM 6.7*  --  7.2* 7.4* 7.3* 7.4* 7.8*  AST 33  --   --   --   --   --   --   ALT 50  --   --   --   --   --   --   ALKPHOS 55  --   --   --   --   --   --   BILITOT 0.9  --   --   --   --   --   --   < > = values in this interval not displayed.  GFR Estimated Creatinine Clearance: 75.1 mL/min (by C-G formula based on Cr of 0.43).  Liver Function Tests:  Recent Labs Lab 07/10/14 1336  AST 33  ALT 50  ALKPHOS  55  BILITOT 0.9  PROT 4.7*  ALBUMIN 2.5*    Urine Studies     Component Value Date/Time   COLORURINE AMBER* 07/04/2014 1359   APPEARANCEUR CLEAR 07/04/2014 1359   LABSPEC 1.031* 07/04/2014 1359   PHURINE 5.5 07/04/2014 1359   GLUCOSEU NEGATIVE 07/04/2014 1359   HGBUR MODERATE* 07/04/2014 1359   BILIRUBINUR NEGATIVE 07/04/2014 1359   KETONESUR 40* 07/04/2014 1359   PROTEINUR 30* 07/04/2014 1359   UROBILINOGEN 0.2 07/04/2014 1359   NITRITE NEGATIVE 07/04/2014 1359   LEUKOCYTESUR NEGATIVE 07/04/2014 1359   CBG:  Recent Labs Lab 07/12/14 0746 07/13/14 0728 07/14/14 0710 07/15/14 0721 07/21/14 0723  GLUCAP 118* 121* 97 125* 140*      Imaging Studies:   Dg Chest Port 1 View  21-Jul-2014   CLINICAL DATA:  Cough.  Metastatic non-small cell carcinoma.  EXAM: PORTABLE CHEST - 1 VIEW  COMPARISON:  07/09/2014  FINDINGS: PICC line is in stable position within the SVC. Lungs show stable appearance of bilateral small pleural effusions with associated bilateral lower lobe atelectasis. Mild interstitial prominence remains in both lungs without evidence of overt airspace edema. The heart size is normal. No pneumothorax.  IMPRESSION: Stable bilateral pleural effusions with associated bilateral lower lobe atelectasis.   Electronically Signed   By: Aletta Edouard M.D.   On: 07/21/14 08:32     Assessment/Plan: 74 y.o.   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 multiple bone metastases to ribs and spine  Most notably affected levels are from L3 to L5-S1 level. Repeat MRI of the lumbar spine 07/14/2014 notable for progression of metastatic disease, with new epidural hematoma extending from L3 - T12 level with diffuse osseous metastatic  disease Started with radiation treatment of 07/10/2014 and this is scheduled to continue until 07/23/2014, patient is receiving radiation therapy to neck and lumbar spine at this time  under the care of Dr. 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 Lower extremity numbness and weakness Pain especially located in lower back, legs and arms bilaterally On IV analgesics per primary team and radiation treatments Symptoms improving  Left subclavian thrombus CT neck shows Thrombus at the junction of the LEFT subclavian vein and LEFT brachiocephalic vein  Anticoagulation with Lovenox per Pharmacy started, but placed on hold on 5/16 due to epidural hematoma formation   Anemia in neoplastic disease No transfusion is indicated at this time  Leukocytosis Likely reactive, in the setting of steroids Monitor counts  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  The repeat biopsy that he had did show lung cancer. Given the fact that he is Asian and really has not smoked for a while, I think we have to see if he has a genetic mutation that we might be overdue treat with an oral agent. I spoke to pathology today. We will send off for genetic assay analysis. This may take a week to get back.  I really think that radiation is the primary mode of therapy right now. He really needs radiation to his lumbar spine.  A much or if this epidural hematoma is causing issues for him area and we will have to watch him closely.  This is definitely a very difficult problem. Our best hope for him is if he does have a genetic anomaly that we can treat with one of our targeted oral drugs.  Nutrition is incredibly important for him. We had to  make sure that he continues to eat well.  I do appreciate all the outstanding care that he is getting from the staff on 5 E.!!  [place] [phone]

## 2014-07-16 NOTE — Progress Notes (Signed)
[facility] Therapy Treatment Record Phone [phone]   Radiation Therapy was administered to [name] [name] on: 07/16/2014  5:27 PM and was treatment # 5 out of a planned course of 10 treatments.

## 2014-07-16 NOTE — Progress Notes (Addendum)
Physical Therapy Treatment Patient Details Name: [name] [name] MRN: [id number] DOB: 1940-08-09 Today's Date: 07/16/2014    History of Present Illness 74 yo male admitted 07/04/14 with CT scan and MRI of the L-spine showing multiple abnormalities consistent with metastatic malignancy. CT abdomen and pelvis showing pleural effusion right greater than left. Patient complaining of hip pain. Patient had MRI done which showed left subdural hematomas. positive for thrombosis L subclavian vein.. Patient s/p biopsy yesterday off lymph nodes results are pending. Pt has epidural mass and multiple bony mets.  5/17: Pt has subclavian DVT and epidural hematoma spine    PT Comments    Assisted pt OOB and amb limited distance with + 3 assist and use of EVA walker, R KI and R ACE wrap foot into dorsi flexion.  BP remained high (reported to RN).  Returned to room then assisted back to bed.  Follow Up Recommendations  SNF     Equipment Recommendations       Recommendations for Other Services       Precautions / Restrictions Precautions Precautions: Fall;Back Precaution Comments: profound R leg weakness, will buckle, use KI and ACE wrap foot into Dorsi Flex for OOB Required Braces or Orthoses: Knee Immobilizer - Right Restrictions Weight Bearing Restrictions: No Other Position/Activity Restrictions: WBAT    Mobility  Bed Mobility Overal bed mobility: Needs Assistance Bed Mobility: Supine to Sit;Sit to Supine     Supine to sit: Mod assist;+2 for physical assistance;+2 for safety/equipment Sit to supine: Max assist;+2 for physical assistance;+2 for safety/equipment   General bed mobility comments: increased time and extra assist to scoot.  Max c/o dizziness initially  Transfers Overall transfer level: Needs assistance Equipment used: None Transfers: Stand Pivot Transfers Sit to Stand: Max assist;+2 physical assistance         General transfer comment: "bear hug" stand pivot towards pt L  sronger side and knee blocked.  Assisted from recliner back to bed.    Ambulation/Gait Ambulation/Gait assistance: +2 physical assistance;+2 safety/equipment;Max assist Ambulation Distance (Feet): 14 Feet Assistive device: Bilateral platform walker Gait Pattern/deviations: Step-to pattern;Decreased step length - right;Decreased stance time - right;Staggering right Gait velocity: decreased   General Gait Details: used EVA walker for increased support.  Used R KI for knee stability and ACE wrapped R foot into Dorsi Flexion to prevent R toe drag.  Pt required + 2 assist such that recliner was following.  Pt required + 2 assist for trunk support and + 2 assist to advance and place R LE correctly during swing phase.  R hip flexors exhibit 2/4 anti gravity such that pt was able to advance R LE 25% on his own.  Tolerated amb on 4 lts venti mask 30% with avg sats 96% and HR in mid 80's however BP was high 200/74 (reported to RN).    Stairs            Wheelchair Mobility    Modified Rankin (Stroke Patients Only)       Balance                                    Cognition Arousal/Alertness: Awake/alert Behavior During Therapy: WFL for tasks assessed/performed                        Exercises      General Comments        Pertinent  Vitals/Pain Pain Assessment: Faces Faces Pain Scale: Hurts little more Pain Location: R hip and ribcage Pain Descriptors / Indicators: Aching Pain Intervention(s): Monitored during session;Repositioned    Home Living                      Prior Function            PT Goals (current goals can now be found in the care plan section) Progress towards PT goals: Progressing toward goals    Frequency  Min 3X/week    PT Plan      Co-evaluation             End of Session Equipment Utilized During Treatment: Gait belt;Oxygen;Right knee immobilizer (ACE wrap R foot into Dorsi Flexion) Activity Tolerance:  Patient limited by fatigue;Treatment limited secondary to medical complications (Comment) Patient left: in bed;with call bell/phone within reach     Time: 1330-1400 PT Time Calculation (min) (ACUTE ONLY): 30 min  Charges:  $Gait Training: 8-22 mins $Therapeutic Activity: 8-22 mins                    G Codes:      Rica Koyanagi  PTA [facility]  Acute  Rehab Pager      [phone]

## 2014-07-16 NOTE — Progress Notes (Addendum)
Patient ID: [name] [name], male   DOB: [birth year], 74 y.o.   MRN: [id number]  [facility] PROGRESS NOTE  [name] [name] LPF:[id number] DOB: [birth year] DOA: 07/04/2014 PCP: [name], MD   Brief narrative:    74 year old male with rheumatoid arthritis and hypertension, presented to [facility] ED with main concern of 3 weeks duration of bilateral chest pain and neck swelling, dyspnea with exertion and at rest, dysphagia and odynophagia, hemoptysis. PCP requested CT scan which was notable for massive thyroid gland with adjacent phlegmon, L>R, necrotic cervical lymphadenopathy c/w metastatic disease and possible acute suppurative thyroiditis. CT chest concerning for acute versus chronic, infectious versus inflammatory process, lytic lesions seen in spine and ribs.   Seen by oncology, ENT and endocrinology. Primary thyroid malignancy felt to be somewhat unusual and lymphoma possible consideration. Patient underwent MRI which found incidentally subdural hematoma. Patient has been started on low-dose anticoagulation, also started on IV steroids after discussion with neurosurgeon. He underwent biopsy with tissue results noting an adenocarcinoma that can be seen with either lung or thyroid consistent with metastatic disease. Patient also noted to be aspirating during barium swallow study on 5/9. Started on broad-spectrum IV antibiotics.  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 - pt still with significant weakness mostly in lower extremities R>>L - please note that patient started with radiation treatment of 07/10/2014 and this is scheduled to continue until 07/23/2014, patient is receiving radiation therapy to neck and lumbar spine at this time under the care of Dr. Isidore Moos - MRI of T spine obtained, no spinal cord compression however there is bone metastases to the spine. - appreciated radiation oncology assistance   Acute on chronic cancer related pain - in multiple sites throughout the body: upper and lower back area, bilateral LE's and bilateral UE's - pt with persistent pain, progression of the disease noted in the lumbar spine  - continue analgesia as needed  - also continuing with radiation treatments as noted above   Lower extremity numbness and weakness right more than left, acute and worse 5/13 - d/w Dr. Isidore Moos, plan is to continue with radiation therapy until 07/23/2014 - Patient currently receiving radiation therapy to neck and lumbar spine area, further radiation treatment plan depending on MRI of the thoracic spine - also continue analgesia as needed      Rheumatoid arthritis - Plaquenil and methotrexate on hold.     HTN, accelerated - likely from underlying acute pain - continue ACEI and beta blocker - added hydralazine as needed for better BP control     Lung nodule - per oncologist, likely the primary source of the metastatic disease - appreciate oncologist help  - patient is inquiring about second opinion at [facility]  Thrombosis of left subclavian vein, new epidural hematoma from L3 - T12 on MRI lumbar spine 5/16 - With subdural hematomas, case  discussed with neurosurgery by my colleague Dr. Maryland Pink - patient has a high clot risk given thrombus and now MRI lumbar spine 5/16 with new epidural hematoma dorsal aspect of the canal extending from mid L3 to the T12 level with maximal thickness at the L2 and L3 level measuring up to 6.5 mm AP dimension.  - started low dose AC given high risk for thrombosis and now with hematoma, Lovenox must be discontinued 5/16 - very difficult situation - Hg has remained stable with no signs of beleeding     Metabolic acidosis with hypokalemia  - provided IVF, pt has responded well, Will repeat BMP in the morning    Leukocytosis - ikely stress margination plus secondary to steroids. -  Chest x-ray and UA stable. Remains afebrile.    Dysphagia - Felt to be secondary to enlarged thyroid - able to tolerate soft diet     Poor dentition - Dental consult requested, no further work up at this time     Anemia of chronic disease, malignancy - Hg remains stable with no signs of bleeding - repeat CBC in AM    Acute respiratory failure secondary to pleural effusion, dysphagia, aspiration PNA - pleural effusion, ? Malignant - He did earlier in the admission for aspiration pneumonia with IV antibiotics which are currently finished. 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 Thyroid studies noted suppressed TSH and elevated free T4/T3 - started on methimazole plus Decadron per endocrinologist, also on Propranolol  - [place] noted enlarged inhomogenous thyroid gland with calcification a single area in right lobe, no discrete thyroid mass. - biopsy of the left lymph node in the neck from 5/9 confirms metastatic adenocarcinoma  - appreciate endocrinologist Dr. Cindra Eves help, he is recommendation is to continue current regimen with no indications  for additional intervention   SDH (subdural hematoma) with spinal cord hematoma new on MRI lumbar spine as noted above - was initially on low-dose anticoagulation but this has been discontinued due to new spinal cord hematoma on lumbar MRI 07/14/2014    Severe PCM - in the context of acute illness - still very poor oral intake    Code Status: Full code  Family Communication: Discussed with son at bedside at length, understand guarded prognosis and further plan in management. Waiting for pt's wife to continue our goals of care discussion. Emotional support provided to son and pt, both appreciated our time and concern. I encouraged family to ask questions with no hesitation. Also explained that we can provide interpreter for better communication if needed.   Disposition Plan: Barrier to discharge - pt still on IV ABX, cancer work up still in progress, given worsening LE weakness, cord compression must be ruled out but radiation therapy needs to be done today at the expense of doing an MRI. D/W Dr. Isidore Moos, we can proceed with MRI likely in AM.   IV access:  Peripheral IV  Procedures and diagnostic studies:     CXR  07/06/2014  Stable bilateral pleural effusions and associated atelectasis. 2. Stable changes of COPD and borderline cardiomegaly.     Ct Head Wo Contrast  07/10/2014  Stable appearance of posterior and medial subdural hematoma is. 2. Stable appearance of anterior left  frontal extra-axial fluid. This may represent hematoma or less likely tumor. 3. Stable appearance of infiltrative disease along the posterior medial left frontal lobe near the vertex.     Ct Head Wo Contrast   07/07/2014  Mild diffuse cortical atrophy. Mild chronic ischemic white matter disease.  Left frontal and posterior fossa subdural hematomas are noted as described on prior MRI.  High density material is noted in left parafalcine region for the vertex which may represent hemorrhage or leptomeningeal neoplastic  disease as described on prior MRI.     Ct Soft Tissue Neck W Contrast  07/03/2014  Massive thyroid gland enlargement with adjacent phlegmon. LEFT-greater-than- RIGHT necrotic cervical lymphadenopathy. 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 Acute suppurative thyroiditis is in the differential considerations. 2. 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 3. Markedly carious dentition with osteolysis in the RIGHT mandibular body. This may be odontogenic or metastatic.     Ct Chest Wo Contrast  07/03/2014  There are coarse reticular opacities with intervening hazy ground-glass opacity and associated if edematous cystic change in the right upper lobe. This could all be chronic. Acute infectious or inflammatory infiltrate is possible. 2. No discrete pulmonary mass. 4 mm pleural-based nodule noted in the peripheral right upper lobe. No other suspicious nodules 3. Small, right greater than left, pleural effusions with associated dependent atelectasis. No pulmonary edema. 4. Neck base abnormalities. Patient also underwent neck CT. There is ill-defined soft tissue that involves and surrounds the thyroid gland. The thyroid appears enlarged and heterogeneous. There are multiple prominent neck base lymph nodes. 5. There are lytic lesions involving 3 discrete ribs. The left fifth rib also has associated soft tissue mass. This is highly suspicious for metastatic disease to bone.    Mr Jeri Cos Wo Contrast  07/06/2014  Left side subdural hematomas in both the posterior fossa and along the left hemisphere, appear to be new since the neck CT on 07/03/2014. 2. Abnormal appearance of a 3-4 cm area of the pachy- and leptomeninges at the left vertex over the left superior frontal gyrus. Given the possibility of metastatic thyroid carcinoma in this patient, this  constellation may reflect a dural metastasis from thyroid cancer, and thyroid metastases in the brain are prone to hemorrhage - which might explain #1. In the absence of a known malignancy, the differential diagnosis of this dural abnormality would include a dural vascular malformation. 3. No cerebral or cerebellar edema. No significant intracranial mass effect at this time.   Mr Lumbar Spine Wo Contrast  07/04/2014  Findings consistent with multifocal metastatic disease. 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 Most notably affected levels are from L3 to L5-S1 level.  Findings were discussed with Dr. Carmin Muskrat at the time of interpretation.     Nm Bone Scan Whole Body  07/08/2014  Abnormal increased bilateral rib activity compatible with metastatic disease. No other suspicious bony abnormalities identified.  Focus of increased activity within the lateral soft tissues of the upper left arm -indeterminate. Correlate clinically.   US Soft Tissue Head/neck  07/06/2014  Enlarged inhomogeneous thyroid gland with a single nonspecific calcification in the RIGHT lobe.  No discrete thyroid mass identified.  Scattered cervical lymph nodes normal to mildly enlarged in sizes.  Ct Abdomen Pelvis W Contrast  07/05/2014 No acute finding within the abdomen pelvis. 2. No extraosseous metastatic disease or evidence of a primary neoplasm. 3. 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 The visible vertebrae show a subtle heterogeneous pattern without a discrete osteoblastic or osteolytic lesion. 4. Moderate right and small to moderate left pleural effusions with significant associated lower lobe atelectasis. No lung base nodules.     Dg Esophagus  07/07/2014  Suboptimal exam due to immediate aspiration, which elicited a cough reflex. No esophageal  mass or stricture  US Biopsy  07/11/2014   Technically successful ultrasound guided right cervical lymph node biopsy   US Biopsy    Status post ultrasound-guided biopsy of left cervical lymph nodes.  CXR  07/09/2014  Right arm PICC tip in the SVC  Right upper lobe airspace disease may represent edema or pneumonia.  CXR 07/07/2014  Left lower lobe airspace consolidation.. Bilateral pleural effusions and interstitial edema consistent with pneumonia.    Mr Lumbar Spine W Wo Contrast  07/14/2014  New epidural hematoma dorsal aspect of the canal extending from mid L3 to the T12 level with maximal thickness at the L2 and L3 level measuring up to 6.5 mm AP dimension. This contributes to thecal sac narrowing with anterior displacement of the contained nerve roots most notable L2 level. The superior extent of this epidural hematoma was not imaged on the present exam. Thoracic/cervical spine MR can be obtained for further delineation if clinically desired.  Diffuse osseous metastatic disease involving all visualized osseous structures. This is most notable involving the L4 and L5 vertebral body with progressive osseous metastatic disease as best appreciated posterior aspect of the L3 vertebral body. Epidural extension of tumor greatest at the L3-4 through L5 pedicle level with extension into the right L3-4 and L4-5 and left L4-5 neural foramen. Extension of tumor into the psoas muscles and paraspinal small slit greater on the right. Degenerative changes combined with epidural tumor causes L3-4 multifactorial marked thecal sac narrowing, L4-5 severe thecal sac narrowing and L5-S1 mild to moderate thecal sac narrowing  Medical Consultants:    Oncology  ENT  Endocrinology  Pulmonary  Interventional radiology  IAnti-Infectives:    Anti-infectives    Start     Dose/Rate Route Frequency Ordered Stop   07/14/14 0800  fluconazole (DIFLUCAN) tablet 100 mg     100 mg Oral Daily 07/14/14 0722     07/10/14 1000   vancomycin (VANCOCIN) IVPB 750 mg/150 ml premix  Status:  Discontinued     750 mg 150 mL/hr over 60 Minutes Intravenous Every 8 hours 07/10/14 1000 07/15/14 1425   07/09/14 2020  vancomycin (VANCOCIN) 1 GM/200ML IVPB  Status:  Discontinued    Comments:  Manuela Neptune   : cabinet override      07/09/14 2020 07/09/14 2039   07/08/14 0900  vancomycin (VANCOCIN) IVPB 750 mg/150 ml premix  Status:  Discontinued     750 mg 150 mL/hr over 60 Minutes Intravenous Every 12 hours 07/08/14 0816 07/10/14 1000   07/07/14 2359  piperacillin-tazobactam (ZOSYN) IVPB 3.375 g  Status:  Discontinued     3.375 g 12.5 mL/hr over 240 Minutes Intravenous Every 8 hours 07/07/14 2332 07/15/14 1425      SINGH,PRASHANT K, MD  [place] Pager [phone]  If 7PM-7AM, please contact night-coverage [email or web address] Password [email or web address] 07/16/2014, 1:46 PM   LOS: 12 days   HPI/Subjective:  In bed, denies any headache, no chest  abdominal pain. Does have some tonic right leg weakness.  Objective: Filed Vitals:   07/15/14 2212 07/16/14 0700 07/16/14 0910 07/16/14 1008  BP: 146/68 147/66 154/65   Pulse: 78 74    Temp: 98 F (36.7 C) 98.3 F (36.8 C)    TempSrc: Oral Oral    Resp: 20 20    Height:      Weight:      SpO2: 92% 93%  98%    Intake/Output Summary (Last 24 hours) at 07/16/14 1346 Last data filed at 07/16/14 0918  Gross per 24 hour  Intake    240 ml  Output    400 ml  Net   -160 ml    Exam:   General:  Pt is alert, follows commands appropriately, not in acute distress  Cardiovascular: Regular rate and rhythm,  no rubs, no gallops  Respiratory: diminished breath sounds at bases, scattered rhonchi bilaterally  Abdomen: Soft, non tender, non distended, bowel sounds present, no guarding  Extremities: pulses DP and PT palpable bilaterally  Data Reviewed: Basic Metabolic Panel:  Recent Labs Lab 07/12/14 0540 07/13/14 0502 07/14/14 0353 07/15/14 0525 07/16/14 0400  NA 138 137 134* 133* 139   K 3.7 3.8 3.7 3.9 3.6  CL 107 106 103 102 108  CO2 '24 23 23 '$ 21* 23  GLUCOSE 117* 117* 108* 116* 121*  BUN 31* 26* 19 26* 31*  CREATININE 0.47* 0.42* 0.41* 0.48* 0.43*  CALCIUM 7.2* 7.4* 7.3* 7.4* 7.8*   Liver Function Tests:  Recent Labs Lab 07/10/14 1336  AST 33  ALT 50  ALKPHOS 55  BILITOT 0.9  PROT 4.7*  ALBUMIN 2.5*   CBC:  Recent Labs Lab 07/10/14 1336  07/12/14 0540 07/13/14 0502 07/14/14 0353 07/15/14 0525 07/16/14 0400  WBC 29.4*  < > 26.0* 27.2* 25.5* 24.0* 24.0*  NEUTROABS 26.0*  --   --   --   --   --   --   HGB 8.6*  < > 9.0* 8.8* 8.8* 9.2* 8.8*  HCT 26.7*  < > 27.3* 26.8* 26.2* 27.5* 26.9*  MCV 96.0  < > 95.1 94.7 94.6 94.8 95.7  PLT 189  < > 180 166 150 153 126*  < > = values in this interval not displayed. CBG:  Recent Labs Lab 07/12/14 0746 07/13/14 0728 07/14/14 0710 07/15/14 0721 07/16/14 0723  GLUCAP 118* 121* 97 125* 140*    Recent Results (from the past 240 hour(s))  Culture, blood (routine x 2)     Status: None   Collection Time: 07/04/14  1:54 PM  Result Value Ref Range Status   Specimen Description BLOOD LEFT ANTECUBITAL  Final   Special Requests BOTTLES DRAWN AEROBIC AND ANAEROBIC 5 CC EA  Final   Culture   Final    NO GROWTH 5 DAYS Performed at [facility]    Report Status 07/10/2014 FINAL  Final  Culture, blood (routine x 2)     Status: None   Collection Time: 07/04/14  1:55 PM  Result Value Ref Range Status   Specimen Description BLOOD BLOOD RIGHT FOREARM  Final   Special Requests BOTTLES DRAWN AEROBIC AND ANAEROBIC 5 CC EA  Final   Culture   Final    NO GROWTH 5 DAYS Performed at [facility]    Report Status 07/10/2014 FINAL  Final  Culture, Urine     Status: None   Collection Time: 07/04/14  1:59 PM  Result Value Ref Range Status  Specimen Description URINE, RANDOM  Final   Special Requests NONE  Final   Colony Count NO GROWTH Performed at [facility]   Final   Report Status  07/05/2014 FINAL  Final  MRSA PCR Screening     Status: None   Collection Time: 07/04/14  2:42 PM  Result Value Ref Range Status   MRSA by PCR NEGATIVE NEGATIVE Final     Scheduled Meds: . acetaminophen  500 mg Oral TID  . antiseptic oral rinse  7 mL Mouth Rinse BID  . benazepril  40 mg Oral Daily  . budesonide-formoterol  2 puff Inhalation BID  . dexamethasone  8 mg Oral Q12H  . docusate  100 mg Oral BID  . feeding supplement (RESOURCE BREEZE)  1 Container Oral BID BM  . fluconazole  100 mg Oral Daily  . hydrALAZINE  10 mg Oral 3 times per day  .  HYDROmorphone (DILAUDID) injection  1 mg Intravenous 30 min Pre-Op  . methimazole  60 mg Oral Daily  . OxyCODONE  15 mg Oral Q12H  . pantoprazole  40 mg Oral BID  . propranolol  60 mg Oral QID  . sodium chloride  10-40 mL Intracatheter Q12H   Continuous Infusions:

## 2014-07-16 NOTE — Progress Notes (Signed)
MD advised CSW of tentative plans for dc on tomorrow back to SNF. CSW has advised SNF rep of this and they are seeking SNF auth from [facility].   Eduard Clos, MSW, [place]

## 2014-07-16 NOTE — Progress Notes (Signed)
Radiation Oncology  Progress note:  [name] had his thoracic MRI scan today.  There is extensive bony metastatic disease to ribs bilaterally and multiple vertebral bodies.  There is epidural tumor on the right at the T3 and T4 levels with mild spinal stenosis but no spinal cord compression.  The spinal cord signal is normal.  The patient denies pain along his thoracic spine.  His pain continues to be along the lower lumbar spine, right greater than left.  He tells me he is more comfortable today from adjustment of his pain medication.  He does report regression of his thyroid/neck mass.  No dysphagia at this time.  Physical examination: There appears to be regression of his thyroid/neck mass.  There is no palpable thoracic spine discomfort.  On neurologic examination, he still has marked weakness of his distal right lower extremity along with decreased sensation along his L5 dermatome.  Sensation is normal to light touch along the lower abdomen.  There is also mild weakness along the distal left lower extremity but no loss of sensation.  Impression: I feel that his neurologic deficits along the lower extremities is from his disease along the lower lumbar spine which is currently being treated.  He has thus far received 1500 cGy in 5 sessions of a scheduled 3000 cGy in 10 sessions.  He certainly is at risk for spinal cord compression along his thoracic spine, and we should be aware of development of any sensory level along the upper to mid thoracic spine.  Hopefully is a candidate for some type of systemic therapy following completion of radiation therapy.  Plan: As above.  Continue with palliative radiotherapy to his LS-spine/pelvis and neck thyroid.  He will finish his radiation therapy next Wednesday.  Plans for systemic therapy through Dr. Burney Gauze.

## 2014-07-16 NOTE — Care Management Note (Signed)
Case Management Note  Patient Details  Name: [name] [name] MRN: [id number] Date of Birth: May 10, 1940  Subjective/Objective:                 74 yo male admitted with metastatic cancer from home  Action/Plan: [place] working with patient and facility for SNF placement  Expected Discharge Date:   (unknown)               Expected Discharge Plan:  [place]  In-House Referral:  NA  Discharge planning Services  CM Consult  Post Acute Care Choice:  NA Choice offered to:  NA  DME Arranged:    DME Agency:     HH Arranged:    [place] Agency:     Status of Service:  Completed, signed off  Medicare Important Message Given:    Date Medicare IM Given:    Medicare IM give by:    Date Additional Medicare IM Given:    Additional Medicare Important Message give by:     If discussed at [place] of Stay Meetings, dates discussed:    Additional Comments:  Scot Dock, RN 07/16/2014, 11:44 AM

## 2014-07-17 ENCOUNTER — Ambulatory Visit
Admit: 2014-07-17 | Discharge: 2014-07-17 | Disposition: A | Discharge status: Home / Self Care | Payer: BC Managed Care – PPO | Attending: Radiation Oncology | Admitting: Radiation Oncology

## 2014-07-17 LAB — CBC WITH DIFFERENTIAL/PLATELET
Basophils Absolute: 0 10*3/uL (ref 0.0–0.1)
Basophils Relative: 0 % (ref 0–1)
EOS PCT: 0 % (ref 0–5)
Eosinophils Absolute: 0 10*3/uL (ref 0.0–0.7)
HEMATOCRIT: 26.5 % — AB (ref 39.0–52.0)
Hemoglobin: 8.7 g/dL — ABNORMAL LOW (ref 13.0–17.0)
LYMPHS ABS: 0.4 10*3/uL — AB (ref 0.7–4.0)
Lymphocytes Relative: 2 % — ABNORMAL LOW (ref 12–46)
MCH: 31.4 pg (ref 26.0–34.0)
MCHC: 32.8 g/dL (ref 30.0–36.0)
MCV: 95.7 fL (ref 78.0–100.0)
Monocytes Absolute: 1.2 10*3/uL — ABNORMAL HIGH (ref 0.1–1.0)
Monocytes Relative: 5 % (ref 3–12)
NEUTROS ABS: 22.5 10*3/uL — AB (ref 1.7–7.7)
Neutrophils Relative %: 93 % — ABNORMAL HIGH (ref 43–77)
Platelets: 133 10*3/uL — ABNORMAL LOW (ref 150–400)
RBC: 2.77 MIL/uL — AB (ref 4.22–5.81)
RDW: 15.2 % (ref 11.5–15.5)
WBC: 24.1 10*3/uL — ABNORMAL HIGH (ref 4.0–10.5)

## 2014-07-17 LAB — SAVE SMEAR

## 2014-07-17 LAB — BASIC METABOLIC PANEL
ANION GAP: 8 (ref 5–15)
BUN: 30 mg/dL — AB (ref 6–20)
CHLORIDE: 106 mmol/L (ref 101–111)
CO2: 23 mmol/L (ref 22–32)
Calcium: 7.8 mg/dL — ABNORMAL LOW (ref 8.9–10.3)
Creatinine, Ser: 0.43 mg/dL — ABNORMAL LOW (ref 0.61–1.24)
GFR calc Af Amer: >60 mL/min (ref >60)
GFR calc non Af Amer: >60 mL/min (ref >60)
GLUCOSE: 110 mg/dL — AB (ref 65–99)
Potassium: 3.8 mmol/L (ref 3.5–5.1)
Sodium: 137 mmol/L (ref 135–145)

## 2014-07-17 LAB — GLUCOSE, CAPILLARY: Glucose-Capillary: 121 mg/dL — ABNORMAL HIGH (ref 65–99)

## 2014-07-17 LAB — URINE CULTURE
Colony Count: NO GROWTH
Culture: NO GROWTH

## 2014-07-17 LAB — THYROGLOBULIN LEVEL: THYROGLOBULIN: 284 ng/mL — AB

## 2014-07-17 MED ORDER — SODIUM CHLORIDE 0.9 % IV SOLN
510.0000 mg | Freq: Once | INTRAVENOUS | Status: AC
  Administered 2014-07-17: 510 mg via INTRAVENOUS
  Filled 2014-07-17: qty 17

## 2014-07-17 MED ORDER — IPRATROPIUM-ALBUTEROL 0.5-2.5 (3) MG/3ML IN SOLN
3.0000 mL | Freq: Three times a day (TID) | RESPIRATORY_TRACT | Status: DC
  Administered 2014-07-17: 3 mL via RESPIRATORY_TRACT
  Filled 2014-07-17 (×2): qty 3

## 2014-07-17 MED ORDER — SODIUM CHLORIDE 0.9 % IJ SOLN
10.0000 mL | INTRAMUSCULAR | Status: DC | PRN
  Administered 2014-07-17: 20 mL
  Administered 2014-07-17: 30 mL
  Administered 2014-07-18: 10 mL
  Filled 2014-07-17 (×2): qty 40

## 2014-07-17 NOTE — Progress Notes (Signed)
Chaplain visited patient and patient was being visited by Education officer, museum. Chaplain advised patient of a return visit. Chaplain will follow up with patient   07/17/14 1200  Clinical Encounter Type  Visited With Patient  Visit Type Initial;Spiritual support;Social support  Referral From Palliative care team

## 2014-07-17 NOTE — Progress Notes (Signed)
CSW spoke with representative from [facility] who reported that due to delay in discharge, Pt insurance company was requiring that authorization for placement be restarted (72hour process) if Pt is not discharged to facility today.  CSW notified MD of this situation. MD stated that he believes discharge tomorrow is most appropriate.   CSW met with Pt and Pt son to inform them of the above. CSW explained that if not discharged today, the insurance authorization for SNF may not extend to cover the stay at [facility] and that the choice of facilities would be limited in that situation due to lack of authorization and would likely require out of county placement. Pt and family expressed understanding but stated that they were unsure of what decision to make at this time. CSW stressed that she wanted family to be aware of all options and barriers before making their decision.   CSW has kept [facility] informed.  Will continue to follow.  Peri Maris, [place] 07/17/2014 11:15 AM [phone]

## 2014-07-17 NOTE — Progress Notes (Signed)
Palliative Medicine consult received and chart reviewed and discussed with Dr. Candiss Norse - patient on HOD #13 at time of consult with discharge planned for tomorrow back to SNF with continued radiation and awaiting genotype testing for treatment options. He is a FULL code and no clear plan for his disease projection and treatment preferences outlined should he deteriorate- his wife is in [place] and an interpretor would need to be arranged for family. Recommend outpatient palliative consultation at SNF.   Lane Hacker, DO Palliative Medicine [phone]

## 2014-07-17 NOTE — Progress Notes (Signed)
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 He feels that his legs might be getting a little better. He has no problem with the left leg. There is still weakness in the right thigh. However, he feels that this might be a little bit stronger.  I this be with our pathologist. 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 As such, I am having the specimen Clarise Cruz for genetic testing. Given that he is Asian and has not smoked for 35-40 years, he may be one of the rare patients that has a genetic mutation that we can target with one of our new oral drugs. It certainly would not surprise me if he does have a genetic mutation given how this malignancy has metastasized. It probably will take another 10-14 days before we get these results back.  He is off blood thinner right now. He had that epidural hematoma.  He did have the thoracic spine MRI done. 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  He is already received Zometa. This was given back on May 7.  With his labs, he is still quite anemic. I have to wonder if there is not some type of bone marrow involvement by this malignancy. I'll have to take a look at the blood smear.  His appetite is doing well. He's eating okay. I will check a prealbumin on him.  Radiation is going very nicely so far.  I think that he needs to sit in a chair more. I think this will help him with breathing and trying to prevent atelectasis and potentially pneumonia.   All his vital signs are pretty stable. His blood pressure is 162/74. Temperature 97.6. His lungs are clear. No wheezes are noted. Cardiac exam regular rate and rhythm. Abdomen is soft. Has good bowel sounds. There is no fluid wave. There is no palpable liver or spleen tip. Extremities shows no clubbing, cyanosis or edema in his legs. He has good range of motion strength in the left leg. He has a little bit better strength in the right thigh. There is some slightly improved  dorsiflexion of the right foot. Skin exam shows the ecchymoses in the right hand.  I do want to repeat the CT of his brain to evaluate the subdural hematomas.  I just do not see that he is really able to be discharged right now. There is still quite a lot to do with him.  He may need to be transfused if we find that his hemoglobin continues to drop.  Again, I am hoping and praying that he has a genetic mutation with his cancer so that we can use a targeted drug which is much moist effective and much better tolerated than chemotherapy.  I am very appreciative of all the great care that he is getting up on [place]!!  [place] E.  Psalm 86:6-7

## 2014-07-17 NOTE — Progress Notes (Signed)
Nutrition Follow-up  DOCUMENTATION CODES:  Not applicable  INTERVENTION: - Will d/c Ensure Enlive and Boost Breeze as pt does not like these  - Encourage PO intake of meals  NUTRITION DIAGNOSIS:  Inadequate oral intake related to inability to eat as evidenced by  (resolved.).   Increased protein-energy needs related to cancer, chronic disease as evidenced by estimated needs. -NEW  GOAL:  Patient will meet greater than or equal to 90% of their needs -variably met  MONITOR:  PO intake, Supplement acceptance, Labs, Weight trends, Skin, I & O's  ASSESSMENT: 74 y.o. patient with metastatic cancer with complaints of a 3 month history of chest pain, bilateral rib pain left greater than right, weakness, neck swelling which has worsened over the past 3 weeks to the point where he sometimes has difficulty breathing and problems swallowing. Patient also with a six-month period of a 10 pound weight loss. Patient does endorse decreased oral intake and decreased appetite over the past month. Patient states recently he's been able to eat is a piece of bread and a cup of milk daily.  5/10: -Pt with an 8 lb wt loss since November 2015. (5%- not significant for time frame) - Pt failed SLP evaluation and is currently NPO. SLP recommends nutrition via alternative means.  - Pt reports that he does not want a "hole in his stomach." - RD will monitor for diet advancement vs tube feeding initiation.  5/11: - Per RN, pt wanted to eat despite being made aware of aspiration risk. Diet upgraded to soft diet.  - Pt ate ~25% of breakfast. He said that he has not eaten very much because "he has been kept busy." - RD to order supplements and monitor for tolerance.  - TF recomendations provided if pt does not tolerate po diet.   5/15: -RD consulted for nutritional assessment d/t pt's poor PO intake -PO intake has improved, eating ~100% of CHO modified diet -Pt's appetite and swallowing function has  improved. Per Endocrinology note, pt reported eating hamburger and steak without issues.  5/19: - Pt ate 100% of breakfast yesterday and today with no other intakes documented - He reports he recently returned from radiation and is feeling very tired - Pt reports that his appetite is poor today following treatment but that it has been good overall - He does not like Ensure Enlive or Boost Breeze and requests that they be d/c'ed  Labs: CBGs: 97-140 mg/dL.  Height:  Ht Readings from Last 1 Encounters:  07/04/14 _0  (1.702 m)    Weight:  Wt Readings from Last 1 Encounters:  07/10/14 138 lb 3.7 oz (62.7 kg)    Ideal Body Weight:  67.3 kg  Wt Readings from Last 10 Encounters:  07/10/14 138 lb 3.7 oz (62.7 kg)  01/14/14 147 lb 3.2 oz (66.769 kg)  12/11/13 147 lb 4.3 oz (66.8 kg)  12/05/13 148 lb 1.6 oz (67.178 kg)  11/29/13 149 lb (67.586 kg)  11/19/13 146 lb 11.2 oz (66.543 kg)  11/12/13 145 lb (65.772 kg)  08/25/11 140 lb (63.504 kg)  02/09/10 143 lb (64.864 kg)  02/19/09 148 lb (67.132 kg)    BMI:  Body mass index is 21.64 kg/(m^2).  Estimated Nutritional Needs:  Kcal:  1650-1850  Protein:  85-95 g  Fluid:  1.7-1.9 L/day  Skin:  Wound (see comment)  Diet Order:  Diet regular Room service appropriate?: Yes; Fluid consistency:: Thin  EDUCATION NEEDS:  Education needs addressed   Intake/Output Summary (Last 24  hours) at 07/17/14 1334 Last data filed at 07/17/14 0951  Gross per 24 hour  Intake    240 ml  Output      0 ml  Net    240 ml    Last BM:  5/16  [name] [name], RD, LDN Inpatient Clinical Dietitian Pager # [phone] After hours/weekend pager # [phone]

## 2014-07-17 NOTE — Progress Notes (Signed)
Physical Therapy Treatment Patient Details Name: [name] [name] MRN: [id number] DOB: Dec 09, 1940 Today's Date: 07/17/2014    History of Present Illness 74 yo male admitted 07/04/14 with CT scan and MRI of the L-spine showing multiple abnormalities consistent with metastatic malignancy. CT abdomen and pelvis showing pleural effusion right greater than left. Patient complaining of hip pain. Patient had MRI done which showed left subdural hematomas. positive for thrombosis L subclavian vein.. Patient s/p biopsy yesterday off lymph nodes results are pending. Pt has epidural mass and multiple bony mets.  5/17: Pt has subclavian DVT and epidural hematoma spine    PT Comments    Applied KI R knee and ACE wrap R foot into Dorsi Flex in prep for amb.  Assisted to EOB pt was more able to self rise but with full use of bed rails.  Difficulty scooting.  Pt sat EOB at Supervision level x 7 min while we waited for + 2 help. Amb using B platform EVA walker again but less distance today due to level of fatigue.  Pt was more able to advance R LE 100% of time vs 25% yesterday.  Poor control but def noted increased hip flex.  Returned to room in recliner than performed R LE TE's AP, knee presses, AAROM SAQ's, LAQ's, AAROM SLR and Add/ABd.  Positioned and left in recliner with family in room.    Follow Up Recommendations  SNF     Equipment Recommendations       Recommendations for Other Services       Precautions / Restrictions Precautions Precautions: Fall;Back Precaution Comments: profound R leg weakness, will buckle, use KI and ACE wrap foot into DF for OOB esp amb Restrictions Weight Bearing Restrictions: No Other Position/Activity Restrictions: WBAT    Mobility  Bed Mobility Overal bed mobility: Needs Assistance Bed Mobility: Supine to Sit     Supine to sit: Mod assist;Max assist     General bed mobility comments: increased time and extra assist to scoot.  Statc sitting balance good.  Sat EOB x 7 min  waiting for + 2 assist.   Transfers Overall transfer level: Needs assistance Equipment used: None Transfers: Sit to/from Stand Sit to Stand: Max assist;+2 physical assistance         General transfer comment: + 2 side by side off elevated bed sit to stand to EVA walker.  + 2 sisde bt side stand to sit to control decend.  Ambulation/Gait Ambulation/Gait assistance: +2 physical assistance;+2 safety/equipment;Max assist Ambulation Distance (Feet): 8 Feet Assistive device: Bilateral platform walker (EVA walker)       General Gait Details: used EVA walker for increased support.  Used R KI for knee stability and ACE wrapped R foot into Dorsi Flexion to prevent R toe drag.  Pt required + 3 assist such that recliner was following.  Pt required + 1 assist for trunk support and + 1 assist to advance and place R LE correctly during swing phase.  Pt was more able to advance R LE 5/5 this session. R hip flexors exhibit 2/4 anti gravity such that pt was able to advance R LE 25% on his own.  Tolerated amb on 4 lts venti mask 30% with avg sats 96% and HR in mid 80's however BP was high 200/74 (reported to RN).    Stairs            Wheelchair Mobility    Modified Rankin (Stroke Patients Only)       Balance  Cognition                            Exercises      General Comments        Pertinent Vitals/Pain Pain Assessment: No/denies pain    Home Living                      Prior Function            PT Goals (current goals can now be found in the care plan section) Progress towards PT goals: Progressing toward goals    Frequency  Min 3X/week    PT Plan      Co-evaluation             End of Session Equipment Utilized During Treatment: Gait belt;Oxygen;Right knee immobilizer Activity Tolerance: Patient limited by fatigue;Treatment limited secondary to medical complications (Comment) Patient  left: in chair;with call bell/phone within reach;with family/visitor present     Time: 1447-1520 PT Time Calculation (min) (ACUTE ONLY): 33 min  Charges:  $Gait Training: 8-22 mins $Therapeutic Activity: 8-22 mins                    G Codes:      Rica Koyanagi  PTA [facility]  Acute  Rehab Pager      [phone]

## 2014-07-17 NOTE — Progress Notes (Signed)
Patient ID: BODEE LAFOE, male   DOB: [birth year], 74 y.o.   MRN: [id number]  [facility] PROGRESS NOTE  ROBERTA KELLY OVZ:[id number] DOB: 17-Oct-1940 DOA: 07/04/2014 PCP: [name], MD   Brief narrative:    74 year old male with rheumatoid arthritis and hypertension, presented to [facility] ED with main concern of 3 weeks duration of bilateral chest pain and neck swelling, dyspnea with exertion and at rest, dysphagia and odynophagia, hemoptysis. PCP requested CT scan which was notable for massive thyroid gland with adjacent phlegmon, L>R, necrotic cervical lymphadenopathy c/w metastatic disease and possible acute suppurative thyroiditis. CT chest concerning for acute versus chronic, infectious versus inflammatory process, lytic lesions seen in spine and ribs.   Seen by oncology, ENT and endocrinology. Primary thyroid malignancy felt to be somewhat unusual and lymphoma possible consideration. Patient underwent MRI which found incidentally subdural hematoma. Patient has been started on low-dose anticoagulation, also started on IV steroids after discussion with neurosurgeon. He underwent biopsy with tissue results noting an adenocarcinoma that can be seen with either lung or thyroid consistent with metastatic disease. Patient also noted to be aspirating during barium swallow study on 5/9. Started on broad-spectrum IV antibiotics.  Major events since admission: 5/13 - transferred from [facility]U to [place] unit 5/16 - underwent MRI lumbar spine which was notable for progression of the disease, new epidural hematoma, Lovenox discontinued  5/17 - pt inquiring about second opinion at [facility] center   Assessment/Plan:    Principal Problem:   Metastatic adenocarcinoma of unknown primary, with Subdural and Epidural Hematoma , per oncologist likely lung cancer primary site  - with extensive epidural and right paraspinous tumor, resulting in central canal narrowing and encroachment on both descending and exiting  nerve roots, multiple bone metastases: ribs and spine (on first MRI of the lumbar spine 07/04/2014) - repeat MRI of the lumbar spine 07/14/2014 notable for progression of metastatic disease, with new epidural hematoma extending from L3 -  T12 level with diffuse osseous metastatic disease,  MRI of T spine obtained, no spinal cord compression however there is bone metastases to the spine. - Dr Marin Olp following. - pt still with significant weakness mostly in lower extremities R>>L - please note that patient started with radiation treatment of 07/10/2014 and this is scheduled to continue until 07/23/2014, patient is receiving radiation therapy to neck and lumbar spine at this time under the care of Dr. Isidore Moos - testing for genetic mutation is pending, longterm prognosis regardless is poor, any treatment will be palliative. - will likely DC to SNF in am if MR head stable, follow with Oncology for further workup  LOS -  13    Acute on chronic cancer related pain - in multiple sites throughout the body: upper and lower back area, bilateral LE's and bilateral UE's - pt with persistent pain, progression of the disease noted in the lumbar spine  - continue analgesia as needed  - also continuing with radiation treatments as noted above   Lower extremity numbness and weakness right more than left, acute and worse 5/13 - d/w Dr. Isidore Moos, plan is to continue with radiation therapy until 07/23/2014 - Patient currently receiving radiation therapy to neck and lumbar spine area, further radiation treatment plan depending on MRI of the thoracic spine - also continue analgesia as needed      Rheumatoid arthritis - Plaquenil and methotrexate on hold.     HTN, accelerated - likely from underlying acute pain - continue ACEI and beta blocker - added  hydralazine as needed for better BP control     Lung nodule - per oncologist, likely the primary source of the metastatic disease - appreciate oncologist  help  - patient is inquiring about second opinion at [facility]    Thrombosis of left subclavian vein, new epidural hematoma from L3 - T12 on MRI lumbar spine 5/16 - With subdural hematomas, case discussed with neurosurgery by my colleague Dr. Maryland Pink - patient has a high clot risk given thrombus and now MRI lumbar spine 5/16 with new epidural hematoma dorsal aspect of the canal extending from mid L3 to the T12 level with maximal thickness at the L2 and L3 level measuring up to 6.5 mm AP dimension.  - started low dose AC given high risk for thrombosis and now with hematoma, Lovenox must be discontinued 5/16 - very difficult situation - Hg has remained stable with no signs of beleeding     Metabolic acidosis with hypokalemia  - provided IVF, pt has responded well, Will repeat BMP in the morning    Leukocytosis - ikely stress margination plus secondary to steroids. -  Chest x-ray and UA stable. Remains afebrile.    Dysphagia - Felt to be secondary to enlarged thyroid - able to tolerate soft diet     Poor dentition - Dental consult requested, no further work up at this time     Anemia of chronic disease, malignancy - Hg remains stable with no signs of bleeding - repeat CBC in AM    Acute respiratory failure secondary to pleural effusion, dysphagia, aspiration PNA - pleural effusion, ? Malignant - He did earlier in the admission for aspiration pneumonia with IV antibiotics which are currently finished. 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 Thyroid studies noted suppressed TSH and elevated free T4/T3 - started on methimazole plus Decadron per endocrinologist, also on Propranolol  - [place] noted enlarged inhomogenous thyroid gland with calcification a single area in right lobe, no discrete thyroid  mass. - biopsy of the left lymph node in the neck from 5/9 confirms metastatic adenocarcinoma  - appreciate endocrinologist Dr. Cindra Eves help, he is recommendation is to continue current regimen with no indications for additional intervention   SDH (subdural hematoma) with spinal cord hematoma new on MRI lumbar spine as noted above - was initially on low-dose anticoagulation but this has been discontinued due to new spinal cord hematoma on lumbar MRI 07/14/2014, repeat MRI brain ordered by oncology on 07/17/2014 to be done tomorrow.    Severe PCM - in the context of acute illness - still very poor oral intake    Code Status: Full code  Family Communication: Discussed with son at bedside at length, understand guarded prognosis and further plan in management. Waiting for pt's wife to continue our goals of care discussion. Emotional support provided to son and pt, both appreciated our time and concern. I encouraged family to ask questions with no hesitation. Also explained that we can provide interpreter for better communication if needed.   Disposition Plan: Likely to be discharged to SNF on 07/18/2014 if MRI brain remained stable..   IV access:  Peripheral IV  Procedures and diagnostic studies:     CXR  07/06/2014  Stable bilateral pleural effusions and associated atelectasis. 2. Stable changes of COPD and borderline cardiomegaly.     Ct Head Wo Contrast  07/10/2014  Stable appearance of posterior and medial subdural hematoma is. 2. Stable appearance of anterior left frontal extra-axial fluid. This may represent hematoma or less likely tumor. 3. Stable appearance of infiltrative disease along the posterior medial left frontal lobe near the vertex.     Ct Head Wo Contrast   07/07/2014  Mild diffuse cortical atrophy. Mild chronic ischemic white matter disease.  Left frontal and posterior fossa subdural hematomas are noted as described on prior MRI.  High density material is noted in left  parafalcine region for the vertex which may represent hemorrhage or leptomeningeal neoplastic disease as described on prior MRI.     Ct Soft Tissue Neck W Contrast  07/03/2014  Massive thyroid gland enlargement with adjacent phlegmon. LEFT-greater-than- RIGHT necrotic cervical lymphadenopathy. 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 Acute suppurative thyroiditis is in the differential considerations. 2. 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 3. Markedly carious dentition with osteolysis in the RIGHT mandibular body. This may be odontogenic or metastatic.     Ct Chest Wo Contrast  07/03/2014  There are coarse reticular opacities with intervening hazy ground-glass opacity and associated if edematous cystic change in the right upper lobe. This could all be chronic. Acute infectious or inflammatory infiltrate is possible. 2. No discrete pulmonary mass. 4 mm pleural-based nodule noted in the peripheral right upper lobe. No other suspicious nodules 3. Small, right greater than left, pleural effusions with associated dependent atelectasis. No pulmonary edema. 4. Neck base abnormalities. Patient also underwent neck CT. There is ill-defined soft tissue that involves and surrounds the thyroid gland. The thyroid appears enlarged and heterogeneous. There are multiple prominent neck base lymph nodes. 5. There are lytic lesions involving 3 discrete ribs. The left fifth rib also has associated soft tissue mass. This is highly suspicious for metastatic disease to bone.    Mr Jeri Cos Wo Contrast  07/06/2014  Left side subdural hematomas in both the posterior fossa and along the left hemisphere, appear to be new since the neck CT on 07/03/2014. 2. Abnormal appearance of a 3-4 cm area of the pachy- and leptomeninges at the left vertex over the left superior  frontal gyrus. 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 In the absence of a known malignancy, the differential diagnosis of this dural abnormality would include a dural vascular malformation. 3. No cerebral or cerebellar edema. No significant intracranial mass effect at this time.   Mr Lumbar Spine Wo Contrast  07/04/2014  Findings consistent with multifocal metastatic disease. 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 Most notably affected levels are from L3 to L5-S1 level.  Findings were discussed with Dr. Carmin Muskrat at the time of interpretation.     Nm Bone Scan Whole Body  07/08/2014  Abnormal increased bilateral rib activity compatible with metastatic disease. No other suspicious bony abnormalities identified.  Focus of increased activity within the lateral soft tissues of the upper left arm -indeterminate. Correlate clinically.   US Soft Tissue Head/neck  07/06/2014  Enlarged inhomogeneous thyroid gland with a single nonspecific calcification in the RIGHT lobe.  No discrete thyroid mass identified.  Scattered cervical lymph nodes normal to mildly enlarged in sizes.   Ct Abdomen Pelvis W Contrast  07/05/2014 No acute finding within the abdomen pelvis. 2. No extraosseous metastatic disease or evidence of a primary neoplasm. 3. 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 The visible vertebrae show a subtle heterogeneous pattern without a discrete osteoblastic or osteolytic lesion. 4. Moderate right and small to moderate left pleural effusions with significant associated lower lobe atelectasis. No lung base nodules.     Dg Esophagus  07/07/2014   Suboptimal exam due to immediate aspiration, which elicited a cough reflex. No esophageal mass or stricture  US Biopsy  07/11/2014   Technically successful ultrasound guided right cervical lymph node biopsy   US Biopsy    Status post ultrasound-guided biopsy of left cervical lymph nodes.  CXR  07/09/2014  Right arm PICC tip in the SVC  Right upper lobe airspace disease may represent edema or pneumonia.  CXR 07/07/2014  Left lower lobe airspace consolidation.. Bilateral pleural effusions and interstitial edema consistent with pneumonia.    Mr Lumbar Spine W Wo Contrast  07/14/2014  New epidural hematoma dorsal aspect of the canal extending from mid L3 to the T12 level with maximal thickness at the L2 and L3 level measuring up to 6.5 mm AP dimension. This contributes to thecal sac narrowing with anterior displacement of the contained nerve roots most notable L2 level. The superior extent of this epidural hematoma was not imaged on the present exam. Thoracic/cervical spine MR can be obtained for further delineation if clinically desired.  Diffuse osseous metastatic disease involving all visualized osseous structures. This is most notable involving the L4 and L5 vertebral body with progressive osseous metastatic disease as best appreciated posterior aspect of the L3 vertebral body. Epidural extension of tumor greatest at the L3-4 through L5 pedicle level with extension into the right L3-4 and L4-5 and left L4-5 neural foramen. Extension of tumor into the psoas muscles and paraspinal small slit greater on the right. Degenerative changes combined with epidural tumor causes L3-4 multifactorial marked thecal sac narrowing, L4-5 severe thecal sac narrowing and L5-S1 mild to moderate thecal sac narrowing  Medical Consultants:    Oncology  ENT  Endocrinology  Pulmonary  Interventional radiology  IAnti-Infectives:    Anti-infectives    Start     Dose/Rate Route Frequency Ordered Stop   07/14/14 0800   fluconazole (DIFLUCAN) tablet 100 mg     100 mg Oral Daily 07/14/14 0722     07/10/14 1000  vancomycin (VANCOCIN) IVPB 750 mg/150 ml premix  Status:  Discontinued     750 mg 150 mL/hr over 60 Minutes Intravenous Every 8 hours 07/10/14 1000 07/15/14 1425   07/09/14 2020  vancomycin (VANCOCIN) 1 GM/200ML IVPB  Status:  Discontinued    Comments:  Manuela Neptune   : cabinet override      07/09/14 2020 07/09/14 2039   07/08/14 0900  vancomycin (VANCOCIN) IVPB 750 mg/150 ml premix  Status:  Discontinued     750 mg 150 mL/hr over 60 Minutes Intravenous Every 12 hours 07/08/14 0816 07/10/14 1000   07/07/14 2359  piperacillin-tazobactam (ZOSYN) IVPB 3.375 g  Status:  Discontinued     3.375 g 12.5 mL/hr over 240 Minutes Intravenous Every 8 hours 07/07/14 2332 07/15/14 1425      SINGH,PRASHANT K, MD  [place] Pager [phone]  If 7PM-7AM, please contact night-coverage [email or web address] Password [facility] 07/17/2014,  9:39 AM   LOS: 13 days   HPI/Subjective:  In bed, denies any headache, no chest abdominal pain. Does have some tonic right leg weakness.  Objective: Filed Vitals:   07/16/14 2031 07/16/14 2133 07/17/14 0536 07/17/14 0750  BP: 170/70  162/74   Pulse: 73  72   Temp: 97.8 F (36.6 C)  97.6 F (36.4 C)   TempSrc: Oral  Oral   Resp: 18  16   Height:      Weight:      SpO2: 92% 95% 94% 93%   No intake or output data in the 24 hours ending 07/17/14 0939  Exam:   General:  Pt is alert, follows commands appropriately, not in acute distress  Cardiovascular: Regular rate and rhythm,  no rubs, no gallops  Respiratory: diminished breath sounds at bases, scattered rhonchi bilaterally  Abdomen: Soft, non tender, non distended, bowel sounds present, no guarding  Extremities: pulses DP and PT palpable bilaterally  Data Reviewed: Basic Metabolic Panel:  Recent Labs Lab 07/13/14 0502 07/14/14 0353 07/15/14 0525 07/16/14 0400 07/17/14 0545  NA 137 134* 133* 139 137  K 3.8 3.7 3.9  3.6 3.8  CL 106 103 102 108 106  CO2 23 23 21* 23 23  GLUCOSE 117* 108* 116* 121* 110*  BUN 26* 19 26* 31* 30*  CREATININE 0.42* 0.41* 0.48* 0.43* 0.43*  CALCIUM 7.4* 7.3* 7.4* 7.8* 7.8*   Liver Function Tests:  Recent Labs Lab 07/10/14 1336  AST 33  ALT 50  ALKPHOS 55  BILITOT 0.9  PROT 4.7*  ALBUMIN 2.5*   CBC:  Recent Labs Lab 07/10/14 1336  07/13/14 0502 07/14/14 0353 07/15/14 0525 07/16/14 0400 07/17/14 0545  WBC 29.4*  < > 27.2* 25.5* 24.0* 24.0* 24.1*  NEUTROABS 26.0*  --   --   --   --   --  22.5*  HGB 8.6*  < > 8.8* 8.8* 9.2* 8.8* 8.7*  HCT 26.7*  < > 26.8* 26.2* 27.5* 26.9* 26.5*  MCV 96.0  < > 94.7 94.6 94.8 95.7 95.7  PLT 189  < > 166 150 153 126* 133*  < > = values in this interval not displayed. CBG:  Recent Labs Lab 07/13/14 0728 07/14/14 0710 07/15/14 0721 07/16/14 0723 07/17/14 0726  GLUCAP 121* 97 125* 140* 121*    Recent Results (from the past 240 hour(s))  Culture, blood (routine x 2)     Status: None   Collection Time: 07/04/14  1:54 PM  Result Value Ref Range Status   Specimen Description BLOOD LEFT ANTECUBITAL  Final   Special Requests BOTTLES DRAWN AEROBIC AND ANAEROBIC 5 CC EA  Final   Culture   Final    NO GROWTH 5 DAYS Performed at [facility]    Report Status 07/10/2014 FINAL  Final  Culture, blood (routine x 2)     Status: None   Collection Time: 07/04/14  1:55 PM  Result Value Ref Range Status   Specimen Description BLOOD BLOOD RIGHT FOREARM  Final   Special Requests BOTTLES DRAWN AEROBIC AND ANAEROBIC 5 CC EA  Final   Culture   Final    NO GROWTH 5 DAYS Performed at [facility]    Report Status 07/10/2014 FINAL  Final  Culture, Urine     Status: None   Collection Time: 07/04/14  1:59 PM  Result Value Ref Range Status   Specimen Description URINE, RANDOM  Final   Special Requests NONE  Final  Colony Count NO GROWTH Performed at [facility]   Final   Report Status 07/05/2014 FINAL   Final  MRSA PCR Screening     Status: None   Collection Time: 07/04/14  2:42 PM  Result Value Ref Range Status   MRSA by PCR NEGATIVE NEGATIVE Final     Scheduled Meds: . acetaminophen  500 mg Oral TID  . antiseptic oral rinse  7 mL Mouth Rinse BID  . benazepril  40 mg Oral Daily  . budesonide-formoterol  2 puff Inhalation BID  . dexamethasone  8 mg Oral Q12H  . docusate  100 mg Oral BID  . feeding supplement (RESOURCE BREEZE)  1 Container Oral BID BM  . ferumoxytol  510 mg Intravenous Once  . fluconazole  100 mg Oral Daily  . hydrALAZINE  10 mg Oral 3 times per day  .  HYDROmorphone (DILAUDID) injection  1 mg Intravenous 30 min Pre-Op  . ipratropium-albuterol  3 mL Nebulization TID  . methimazole  60 mg Oral Daily  . OxyCODONE  15 mg Oral Q12H  . pantoprazole  40 mg Oral BID  . propranolol  60 mg Oral QID  . sodium chloride  10-40 mL Intracatheter Q12H   Continuous Infusions:

## 2014-07-17 NOTE — Plan of Care (Signed)
Discharge plan.  Kindly note that Mr. [name] [name] has been in the hospital for the last 13 days, I took over his care yesterday.   As noted he is unfortunate gentleman with presumed lung cancer with metastases to his spine. He's been under the care of Dr.Ennever oncologist and radiation oncologist. He is currently undergoing radiation treatments which are supposed to end on this coming Wednesday.  I have received multiple phone calls and pages from the social worker that patient needs to be discharged today as his insurance will not approve bed for placement beyond today.  I discussed this case with Dr. Marin Olp who had requested to keep the patient here for at least the next few days and possibly till his radiation treatments end, he also wanted to get a repeat MRI tomorrow to document his subdural bleeds were stable.  I requested Dr. Marin Olp to kindly consider agreeing for discharging the patient tomorrow if the repeat MRI was stable and patient otherwise had no new issues, also talked to radiation oncologist Dr. Isidore Moos who agreed for the same and would arrange for outpatient radiation treatments if he is placed to a nursing home.  This is clearly documented in the chart and I think is the best plan of action in patient's best interest.

## 2014-07-18 ENCOUNTER — Ambulatory Visit
Admit: 2014-07-18 | Discharge: 2014-07-18 | Disposition: A | Discharge status: Home / Self Care | Payer: BC Managed Care – PPO | Attending: Radiation Oncology | Admitting: Radiation Oncology

## 2014-07-18 ENCOUNTER — Inpatient Hospital Stay (HOSPITAL_COMMUNITY): Payer: BC Managed Care – PPO

## 2014-07-18 LAB — CBC
HEMATOCRIT: 26.2 % — AB (ref 39.0–52.0)
HEMOGLOBIN: 8.7 g/dL — AB (ref 13.0–17.0)
MCH: 31.6 pg (ref 26.0–34.0)
MCHC: 33.2 g/dL (ref 30.0–36.0)
MCV: 95.3 fL (ref 78.0–100.0)
Platelets: 117 10*3/uL — ABNORMAL LOW (ref 150–400)
RBC: 2.75 MIL/uL — AB (ref 4.22–5.81)
RDW: 15.4 % (ref 11.5–15.5)
WBC: 22.4 10*3/uL — ABNORMAL HIGH (ref 4.0–10.5)

## 2014-07-18 LAB — TYPE AND SCREEN
ABO/RH(D): B POS
Antibody Screen: NEGATIVE

## 2014-07-18 LAB — COMPREHENSIVE METABOLIC PANEL
ALT: 36 U/L (ref 17–63)
AST: 21 U/L (ref 15–41)
Albumin: 2.3 g/dL — ABNORMAL LOW (ref 3.5–5.0)
Alkaline Phosphatase: 73 U/L (ref 38–126)
Anion gap: 8 (ref 5–15)
BUN: 29 mg/dL — AB (ref 6–20)
CO2: 23 mmol/L (ref 22–32)
Calcium: 8 mg/dL — ABNORMAL LOW (ref 8.9–10.3)
Chloride: 107 mmol/L (ref 101–111)
Creatinine, Ser: 0.36 mg/dL — ABNORMAL LOW (ref 0.61–1.24)
GFR calc Af Amer: >60 mL/min (ref >60)
GLUCOSE: 124 mg/dL — AB (ref 65–99)
POTASSIUM: 3.7 mmol/L (ref 3.5–5.1)
SODIUM: 138 mmol/L (ref 135–145)
TOTAL PROTEIN: 4.4 g/dL — AB (ref 6.5–8.1)
Total Bilirubin: 0.9 mg/dL (ref 0.3–1.2)

## 2014-07-18 LAB — GLUCOSE, CAPILLARY: Glucose-Capillary: 118 mg/dL — ABNORMAL HIGH (ref 65–99)

## 2014-07-18 LAB — THYROTROPIN RECEPTOR AUTOABS: Thyrotropin Receptor Ab: <6 % (ref <=16.0)

## 2014-07-18 LAB — ABO/RH: ABO/RH(D): B POS

## 2014-07-18 MED ORDER — ALPRAZOLAM 0.5 MG PO TABS: 0.5000 mg | ORAL_TABLET | Freq: Four times a day (QID) | ORAL | Status: AC | PRN

## 2014-07-18 MED ORDER — CHLORPROMAZINE HCL 25 MG PO TABS: 25.0000 mg | ORAL_TABLET | Freq: Four times a day (QID) | ORAL | Status: AC | PRN

## 2014-07-18 MED ORDER — PANTOPRAZOLE SODIUM 40 MG PO TBEC: 40.0000 mg | DELAYED_RELEASE_TABLET | Freq: Two times a day (BID) | ORAL | Status: AC

## 2014-07-18 MED ORDER — IPRATROPIUM-ALBUTEROL 0.5-2.5 (3) MG/3ML IN SOLN: 3.0000 mL | RESPIRATORY_TRACT | Status: AC | PRN

## 2014-07-18 MED ORDER — GADOBENATE DIMEGLUMINE 529 MG/ML IV SOLN
12.0000 mL | Freq: Once | INTRAVENOUS | Status: AC | PRN
  Administered 2014-07-18: 12 mL via INTRAVENOUS

## 2014-07-18 MED ORDER — GLUCERNA 1.5 CAL PO LIQD: ORAL | Status: AC

## 2014-07-18 MED ORDER — HYDRALAZINE HCL 50 MG PO TABS: 50.0000 mg | ORAL_TABLET | Freq: Three times a day (TID) | ORAL | Status: AC

## 2014-07-18 MED ORDER — METOPROLOL SUCCINATE ER 50 MG PO TB24: 50.0000 mg | ORAL_TABLET | Freq: Every day | ORAL | Status: AC

## 2014-07-18 MED ORDER — OXYCODONE HCL 5 MG PO TABS: 5.0000 mg | ORAL_TABLET | ORAL | Status: AC | PRN

## 2014-07-18 MED ORDER — DEXAMETHASONE 4 MG PO TABS: 8.0000 mg | ORAL_TABLET | Freq: Two times a day (BID) | ORAL | Status: AC

## 2014-07-18 MED ORDER — METHIMAZOLE 10 MG PO TABS: 60.0000 mg | ORAL_TABLET | Freq: Every day | ORAL | Status: AC

## 2014-07-18 MED ORDER — FLUCONAZOLE 100 MG PO TABS: 100.0000 mg | ORAL_TABLET | Freq: Every day | ORAL | Status: AC

## 2014-07-18 MED ORDER — POLYETHYLENE GLYCOL 3350 17 G PO PACK: 17.0000 g | PACK | Freq: Every day | ORAL | Status: AC | PRN

## 2014-07-18 MED ORDER — OXYCODONE HCL ER 15 MG PO T12A: 15.0000 mg | EXTENDED_RELEASE_TABLET | Freq: Two times a day (BID) | ORAL | Status: AC

## 2014-07-18 MED ORDER — ONDANSETRON HCL 4 MG PO TABS: 4.0000 mg | ORAL_TABLET | Freq: Three times a day (TID) | ORAL | Status: AC | PRN

## 2014-07-18 NOTE — Discharge Summary (Signed)
[name] [name], is a 74 y.o. male  DOB May 12, 1940  MRN [id number].  Admission date:  07/04/2014  Admitting Physician  Eugenie Filler, MD  Discharge Date:  07/18/2014   Primary MD  [name], MD  Recommendations for primary care physician for things to follow:   Please call palliative care at [facility]. Prognosis is extremely guarded.  Follow with oncology, radiation oncology on a close basis.   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]   Principal Problem:   Metastatic cancer Active Problems:   Rheumatoid arthritis   Hypertension   Lung nodule   Pain of right lower extremity-Lower Back and  Legs   Weakness-Bilateral Leg and Right arm > Left arm   Thrombosis of left subclavian vein   Goiter   Leukocytosis   Dysphagia   SOB (shortness of breath)   Thyroid enlargement   Poor dentition   Epidural mass   Right paraspinous mass   Rib lesion   Dehydration   Anemia   Metastasis   Pleural effusion   Hyperthyroidism   Neck mass   SDH (subdural hematoma)   HCAP (healthcare-associated pneumonia)   Acute respiratory failure      Past Medical History  Diagnosis Date  . Lung nodule 2012  . Insomnia     takes Restoril nightly as needed  . Hypertension     takes Benazepril and Metoprolol daily  . Pneumonia     hx of;many yrs ago  . AAA (abdominal aortic aneurysm)   . Joint pain   . Joint swelling   . Urinary frequency     takes Terazosin daily  . Urinary urgency   . Nocturia   . Enlarged prostate     slightly  . Rheumatoid arthritis(714.0)     takes Plaquenil daily and  Methotrexate weekly    Past Surgical History  Procedure Laterality Date  . Inguinal hernia repair  2008  . Appendectomy    . Abdominal aortic endovascular stent graft Bilateral 12/11/2013    Procedure: ABDOMINAL AORTIC ENDOVASCULAR STENT GRAFT; left groin exploration;  Surgeon: Rosetta Posner, MD;  Location: [facility] OR;  Service: Vascular;  Laterality: Bilateral;       History of present illness and  Hospital Course:     Kindly see H&P for history of present illness and admission details, please review complete Labs, Consult reports and Test reports for all details in brief  HPI  from the history and physical done on the day of admission   74 year old male with rheumatoid arthritis and hypertension, presented to [facility] ED with main concern of 3 weeks duration of bilateral chest pain and neck swelling, dyspnea with exertion and at rest, dysphagia and odynophagia, hemoptysis. PCP requested CT scan which was notable for massive thyroid gland with adjacent phlegmon, L>R, necrotic cervical lymphadenopathy c/w metastatic disease and possible acute  suppurative thyroiditis. CT chest concerning for acute versus chronic, infectious versus inflammatory process, lytic lesions seen in spine and ribs.   Seen by oncology, ENT and endocrinology. Primary thyroid malignancy felt to be somewhat unusual and lymphoma possible consideration. Patient underwent MRI which found incidentally subdural hematoma. Patient has been started on low-dose anticoagulation, also started on IV steroids after discussion with neurosurgeon. He underwent biopsy with tissue results noting an adenocarcinoma that can be seen with either lung or thyroid consistent with metastatic disease. Patient also noted to be aspirating during barium swallow study on 5/9. Treated with IV antibiotics and he has finished his course.   Hospital Course   Metastatic adenocarcinoma of unknown primary, with Subdural and Epidural Hematoma , per oncologist likely  lung cancer primary site  - with extensive epidural and right paraspinous tumor, resulting in central canal narrowing and encroachment on both descending and exiting nerve roots, multiple bone metastases: ribs and spine (on first MRI of the lumbar spine 07/04/2014) - repeat MRI of the lumbar spine 07/14/2014 notable for progression of metastatic disease, with new epidural hematoma extending from L3 - T12 level with diffuse osseous metastatic disease, MRI of T spine obtained, no spinal cord compression however there is bone metastases to the spine. Seen and followed by Dr. Marin Olp.  - pt still with significant weakness mostly in lower extremities R>>L - please note that patient started with radiation treatment of 07/10/2014 and this is scheduled to continue until 07/23/2014, patient is receiving radiation therapy to neck and lumbar spine at this time under the care of Dr. Isidore Moos, I have discussed with Dr. Isidore Moos and he is okay to DC to SNF with follow-up with radiation oncology for continued radiation treatment still coming Saturday. - testing for genetic mutation is pending, longterm prognosis regardless is poor, any treatment will be palliative.  - Repeat MRI of the brain today shows stable subdural hematomas. His long-term prognosis is case is extremely poor kindly involve palliative care at [facility].    Acute on chronic cancer related pain - And tinea supportive care placed on narcotics long and short-acting.   Lower extremity numbness and weakness right more than left, acute and worse 5/13 - d/w Dr. Isidore Moos, plan is to continue with radiation therapy until 07/23/2014 - Patient currently receiving radiation therapy to neck and lumbar spine area, further radiation treatment plan depending on MRI of the thoracic spine, continue Decadron along with supportive care with pain medications.    Rheumatoid arthritis - Plaquenil continued upon discharge have discontinued methotrexate.    HTN,  accelerated - Resume her home dose ACE inhibitor also added hydralazine for better control.    Thrombosis of left subclavian vein, new epidural hematoma from L3 - T12 on MRI lumbar spine 5/16 - With subdural hematomas, case discussed with neurosurgery by my colleague Dr. Maryland Pink -  cannot be anticoagulated, discussed also with Dr. Marin Olp, supportive care only. At risk for future bleeds and clots both.     Leukocytosis - likely stress margination plus secondary to steroids. - Chest x-ray and UA stable. Remains afebrile.    Dysphagia - Felt to be secondary to enlarged thyroid -continue follow-up with each therapy at SNF. Currently on regular diet with aspiration precautions and feeding assistance.     Poor dentition - Dental consult requested, no further work up at this time     Anemia of chronic disease, malignancy - Hg remains stable with no signs of bleeding    Acute respiratory failure secondary to  pleural effusion, dysphagia, aspiration PNA - pleural effusion, ? Malignant - He did earlier in the admission for aspiration pneumonia with IV antibiotics which are currently finished. - patient has not required thoracentesis - pt still requiring oxygen via [place] 2-3 L nasal cannula per minute continue. Continue the Colazal treatments as needed.  - repeat CXR to view in a week.      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 Thyroid studies noted suppressed TSH and elevated free T4/T3 - started on methimazole plus Decadron per endocrinologist, also obeta blocker now placed on long-acting after one week.  - [place] noted enlarged inhomogenous thyroid gland with calcification a single area in right lobe, no discrete thyroid mass. - biopsy of the left lymph node in the neck from 5/9 confirms metastatic adenocarcinoma  - appreciate endocrinologist Dr. Cindra Eves help, he is recommendation is to continue current regimen with no  indications for additional intervention follow with Dr. Buddy Duty in a week post discharge.    SDH (subdural hematoma) with spinal cord hematoma new on MRI lumbar spine as noted above - was initially on low-dose anticoagulation but this has been discontinued due to new spinal cord hematoma on lumbar MRI 07/14/2014, repeat MRI done today is stable.   Severe PCM - in the context of acute illness - still very poor oral intake     Discharge Condition: Extremely guarded will benefit from palliative care consult.   Follow UP  Follow-up Information    Follow up with PANG,RICHARD, MD. Schedule an appointment as soon as possible for a visit in 1 week.   Specialty:  Internal Medicine   Contact information:   [address] [place] [address] [phone]       Follow up with Volanda Napoleon, MD In 1 week.   Specialty:  Oncology   Contact information:   [address], [address] [place] [address] [phone]       Follow up with Eppie Gibson, MD. Schedule an appointment as soon as possible for a visit in 3 days.   Specialty:  Radiation Oncology   Contact information:   [address] [place] [address] [phone]       Follow up with KERR,JEFFREY, MD. Schedule an appointment as soon as possible for a visit in 1 week.   Specialty:  Endocrinology   Why:  Hyperthyroidism   Contact information:   [address] [place] [address] [phone]         Discharge Instructions  and  Discharge Medications          Discharge Instructions    Diet - low sodium heart healthy    Complete by:  As directed      Discharge instructions    Complete by:  As directed   Follow with Primary MD PANG,RICHARD, MD in 7 days   Get CBC, CMP, TSH, Free T3, T4,   by SNF MD in 1 week.    Activity: As tolerated with Full fall precautions use walker/cane & assistance as needed   Disposition SNF   Diet: Heart Healthy with feeding  assistance and aspiration precautions.  For Heart failure patients - Check your Weight same time everyday, if you gain over 2 pounds, or you develop in leg swelling, experience more shortness of breath or chest pain, call your Primary MD immediately. Follow Cardiac Low Salt Diet and 1.5 lit/day fluid restriction.   On your next visit with your primary care  physician please Get Medicines reviewed and adjusted.   Please request your Prim.MD to go over all Hospital Tests and Procedure/Radiological results at the follow up, please get all Hospital records sent to your Prim MD by signing hospital release before you go home.   If you experience worsening of your admission symptoms, develop shortness of breath, life threatening emergency, suicidal or homicidal thoughts you must seek medical attention immediately by calling 911 or calling your MD immediately  if symptoms less severe.  You Must read complete instructions/literature along with all the possible adverse reactions/side effects for all the Medicines you take and that have been prescribed to you. Take any new Medicines after you have completely understood and accpet all the possible adverse reactions/side effects.   Do not drive, operating heavy machinery, perform activities at heights, swimming or participation in water activities or provide baby sitting services if your were admitted for syncope or siezures until you have seen by Primary MD or a Neurologist and advised to do so again.  Do not drive when taking Pain medications.    Do not take more than prescribed Pain, Sleep and Anxiety Medications  Special Instructions: If you have smoked or chewed Tobacco  in the last 2 yrs please stop smoking, stop any regular Alcohol  and or any Recreational drug use.  Wear Seat belts while driving.   Please note  You were cared for by a hospitalist during your hospital stay. If you have any questions about your discharge medications or the care  you received while you were in the hospital after you are discharged, you can call the unit and asked to speak with the hospitalist on call if the hospitalist that took care of you is not available. Once you are discharged, your primary care physician will handle any further medical issues. Please note that NO REFILLS for any discharge medications will be authorized once you are discharged, as it is imperative that you return to your primary care physician (or establish a relationship with a primary care physician if you do not have one) for your aftercare needs so that they can reassess your need for medications and monitor your lab values.     Increase activity slowly    Complete by:  As directed             Medication List    STOP taking these medications        doxycycline 100 MG tablet  Commonly known as:  VIBRA-TABS     ibuprofen 200 MG tablet  Commonly known as:  ADVIL,MOTRIN     methotrexate 2.5 MG tablet     OSTEO BI-FLEX JOINT SHIELD Tabs     temazepam 15 MG capsule  Commonly known as:  RESTORIL     zolpidem 10 MG tablet  Commonly known as:  AMBIEN      TAKE these medications        ALPRAZolam 0.5 MG tablet  Commonly known as:  XANAX  Take 1 tablet (0.5 mg total) by mouth every 6 (six) hours as needed for anxiety.     benazepril 10 MG tablet  Commonly known as:  LOTENSIN  Take 10 mg by mouth daily.     Calcium 600-200 MG-UNIT per tablet  Take 1 tablet by mouth daily.     chlorproMAZINE 25 MG tablet  Commonly known as:  THORAZINE  Take 1 tablet (25 mg total) by mouth 4 (four) times daily as needed for hiccoughs.  D 2000 2000 UNITS Tabs  Generic drug:  Cholecalciferol  Take 1 tablet by mouth daily.     dexamethasone 4 MG tablet  Commonly known as:  DECADRON  Take 2 tablets (8 mg total) by mouth every 12 (twelve) hours.     fish oil-omega-3 fatty acids 1000 MG capsule  Take 1 g by mouth daily.     fluconazole 100 MG tablet  Commonly known as:   DIFLUCAN  Take 1 tablet (100 mg total) by mouth daily.     folic acid 1 MG tablet  Commonly known as:  FOLVITE  Take 1 mg by mouth daily.     GLUCERNA 1.5 CAL Liqd  One can 3 times a day with meals     hydrALAZINE 50 MG tablet  Commonly known as:  APRESOLINE  Take 1 tablet (50 mg total) by mouth 3 (three) times daily.     hydroxychloroquine 200 MG tablet  Commonly known as:  PLAQUENIL  Take 200 mg by mouth 2 (two) times daily.     ipratropium-albuterol 0.5-2.5 (3) MG/3ML Soln  Commonly known as:  DUONEB  Take 3 mLs by nebulization every 4 (four) hours as needed.     methimazole 10 MG tablet  Commonly known as:  TAPAZOLE  Take 6 tablets (60 mg total) by mouth daily.     metoprolol succinate 50 MG 24 hr tablet  Commonly known as:  TOPROL-XL  Take 1 tablet (50 mg total) by mouth daily.     multivitamin tablet  Take 1 tablet by mouth daily.     ondansetron 4 MG tablet  Commonly known as:  ZOFRAN  Take 1 tablet (4 mg total) by mouth every 8 (eight) hours as needed for nausea or vomiting.     oxyCODONE 5 MG immediate release tablet  Commonly known as:  Oxy IR/ROXICODONE  Take 1-2 tablets (5-10 mg total) by mouth every 4 (four) hours as needed for moderate pain.     OxyCODONE 15 mg T12a 12 hr tablet  Commonly known as:  OXYCONTIN  Take 1 tablet (15 mg total) by mouth every 12 (twelve) hours.     pantoprazole 40 MG tablet  Commonly known as:  PROTONIX  Take 1 tablet (40 mg total) by mouth 2 (two) times daily.     polyethylene glycol packet  Commonly known as:  MIRALAX / GLYCOLAX  Take 17 g by mouth daily as needed for mild constipation.     traMADol 50 MG tablet  Commonly known as:  ULTRAM  Take 1 tablet (50 mg total) by mouth every 6 (six) hours as needed for moderate pain.     vitamin E 400 UNIT capsule  Generic drug:  vitamin E  Take 400 Units by mouth 2 (two) times daily.          Diet and Activity recommendation: See Discharge Instructions  above   Consults obtained -  radiation oncology, oncology, IR,   Major procedures and Radiology Reports - PLEASE review detailed and final reports for all details, in brief -     US Biopsy 07/11/2014 Technically successful ultrasound guided right cervical lymph node biopsy   US Biopsy Status post ultrasound-guided biopsy of left cervical lymph nodes.  Dg Chest 2 View  07/06/2014   CLINICAL DATA:  Followup bilateral pleural effusions.  EXAM: CHEST  2 VIEW  COMPARISON:  12/11/2013 and chest CT dated 07/03/2014.  FINDINGS: Borderline enlarged cardiac silhouette. Small to moderate-sized bilateral pleural effusions with previously demonstrated  associated atelectasis. 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 Left shoulder degenerative changes.  IMPRESSION: 1. Stable bilateral pleural effusions and associated atelectasis. 2. Stable changes of COPD and borderline cardiomegaly.   Electronically Signed   By: Claudie Revering M.D.   On: 07/06/2014 09:24   Ct Head Wo Contrast  07/10/2014   CLINICAL DATA:  Subdural hematoma.  EXAM: CT HEAD WITHOUT CONTRAST  TECHNIQUE: Contiguous axial images were obtained from the base of the skull through the vertex without intravenous contrast.  COMPARISON:  MRI brain 07/06/2014  FINDINGS: The subdural hematoma posteriorly about the occipital lobe and along the left lateral aspect of the falx is similar to the MRI, slightly more prominent than the previous CT. Lower density anterior left extra-axial collections are stable. Infiltrative disease extending into the subarachnoid space and adjacent to the falx is again noted along the posterior left frontal lobe near the vertex.  No acute infarct or parenchymal mass lesion is present. No acute parenchymal hemorrhage is present. The ventricles are of normal size.  The paranasal sinuses and mastoid air cells are  clear. Calvarium is intact.  IMPRESSION: 1. Stable appearance of posterior and medial subdural hematoma is. 2. Stable appearance of anterior left frontal extra-axial fluid. This may represent hematoma or less likely tumor. 3. Stable appearance of infiltrative disease along the posterior medial left frontal lobe near the vertex.   Electronically Signed   By: San Morelle M.D.   On: 07/10/2014 09:38   Ct Head Wo Contrast  07/07/2014   CLINICAL DATA:  Subdural hematoma.  EXAM: CT HEAD WITHOUT CONTRAST  TECHNIQUE: Contiguous axial images were obtained from the base of the skull through the vertex without intravenous contrast.  COMPARISON:  MRI scan of Jul 06, 2014.  FINDINGS: Bony calvarium appears intact. Mild diffuse cortical atrophy is noted. Mild chronic ischemic white matter disease is noted. Ventricular size is within normal limits. No midline shift or significant mass effect is noted. 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 There is also noted probable subdural hematoma in the left posterior fossa as described on prior exam. Also noted is left frontal subdural abnormality measuring 5.4 mm in thickness also consistent with hematoma.  IMPRESSION: Mild diffuse cortical atrophy. Mild chronic ischemic white matter disease.  Left frontal and posterior fossa subdural hematomas are noted as described on prior MRI.  High density material is noted in left parafalcine region for the vertex which may represent hemorrhage or leptomeningeal neoplastic disease as described on prior MRI.   Electronically Signed   By: Marijo Conception, M.D.   On: 07/07/2014 12:08   Ct Soft Tissue Neck W Contrast  07/03/2014   CLINICAL DATA:  Neck mass for 2 weeks. Increasing size of neck mass.  EXAM: CT NECK WITH CONTRAST  TECHNIQUE: Multidetector CT imaging of the neck was performed using  the standard protocol following the bolus administration of intravenous contrast.  CONTRAST:  75 mL Isovue-300  COMPARISON:  PET-CT 08/17/2011.  Chest CT today.  FINDINGS: Pharynx and larynx: Cystic lesion is present in the body of the RIGHT mandible which may be odontogenic or represent metastatic disease. Oropharynx and nasopharynx appears patent.  Salivary glands: Parotid glands are within normal limits. Submandibular glands also appear within normal limits.  Thyroid: Massively enlarged thyroid gland  with diffuse phlegmon around the gland. There is low attenuation in the central LEFT thyroid lobe that measures about 3.3 x 2.6 cm. This probably represents metastatic disease in a patient with primary malignancy.  Lymph nodes: Innumerable necrotic lymph nodes are present in the LEFT greater than RIGHT level 2 and level 3 nodal stations extending into the chest.  Vascular: Carotid atherosclerosis. Diminutive LEFT internal jugular vein. There is a large thrombus in LEFT subclavian vein that extends to the margin of the LEFT brachiocephalic vein.  Limited intracranial: No gross acute abnormality.  Mastoids and visualized paranasal sinuses: Mastoid air cells are clear. Mucous retention cyst/ polyp in the LEFT maxillary sinus.  Skeleton: Cervical spine degenerative disease. No pathologic fractures.  Upper chest: Deferred to chest CT.  IMPRESSION: 1. Massive thyroid gland enlargement with adjacent phlegmon. LEFT-greater-than- RIGHT necrotic cervical lymphadenopathy. 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 Acute suppurative thyroiditis is in the differential considerations. 2. 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 3. Markedly carious dentition with osteolysis in the RIGHT mandibular body. This may be odontogenic or metastatic.   Electronically  Signed   By: Dereck Ligas M.D.   On: 07/03/2014 16:56   Ct Chest Wo Contrast  07/03/2014   CLINICAL DATA:  F/u lung nodule pain left side with inspirationSob former smokerNo surgNo hx caOld fx'd ribs  EXAM: CT CHEST WITHOUT CONTRAST  TECHNIQUE: Multidetector CT imaging of the chest was performed following the standard protocol without IV contrast.  COMPARISON:  CT, 01/07/2014.  Chest radiograph, 12/11/2013.  FINDINGS: Thoracic inlet: There is heterogeneous soft tissue surrounding the lower trachea consistent with heterogeneous enlargement of the thyroid gland. Margins of the gland are ill-defined. There are multiple prominent neck base lymph nodes the largest 1 cm in short axis. Partly imaged larynx shows some increased soft tissue in mild narrowing of the airway.  Mediastinum and hila: Heart is normal in size. There are mild to moderate coronary artery calcifications. There is dilation of the ascending aorta measuring 4.6 cm x 4.5 cm in diameter. The aorta is mildly dilated along the arch and proximal descending portion. There are diffuse atherosclerotic calcifications throughout the thoracic aorta.  The multiple small mediastinal lymph nodes. There are no pathologically enlarged lymph nodes. No mediastinal masses. No hilar masses or adenopathy.  Lungs and pleura: There are small, right greater than left, bilateral pleural effusions. There are coarse reticular opacities in the right upper lobe with intervening emphysematous cystic spaces and some intervening hazy ground-glass type opacity. 4 mm noncalcified pleural-based nodule noted in the right upper lobe, image 30, series 4. Calcified granuloma is noted in the right lower lobe peripherally. There is mild atelectasis adjacent to the pleural effusions in both lower lobes there is no pulmonary edema. No pneumothorax. Mild emphysema is noted in the left upper lobe.  Limited upper abdomen: Dense upper abdominal aortic calcification and partly imaged proximal  aspect of an aortic stent. Visualized portions of the liver, spleen, gallbladder and pancreas are unremarkable. No adrenal masses.  Musculoskeletal: There are lytic process cysts involving 3 discrete ribs, of the right posterior fourth rib, right posterior and lateral seventh rib and the left anterior and lateral fifth rib. The soft tissue mass surrounds the lytic process of the left antral lateral fifth rib. No other lytic lesions.  IMPRESSION: 1. There are coarse reticular opacities with  intervening hazy ground-glass opacity and associated if edematous cystic change in the right upper lobe. This could all be chronic. Acute infectious or inflammatory infiltrate is possible. 2. No discrete pulmonary mass. 4 mm pleural-based nodule noted in the peripheral right upper lobe. No other suspicious nodules 3. Small, right greater than left, pleural effusions with associated dependent atelectasis. No pulmonary edema. 4. Neck base abnormalities. Patient also underwent neck CT. There is ill-defined soft tissue that involves and surrounds the thyroid gland. The thyroid appears enlarged and heterogeneous. There are multiple prominent neck base lymph nodes. 5. There are lytic lesions involving 3 discrete ribs. The left fifth rib also has associated soft tissue mass. This is highly suspicious for metastatic disease to bone.   Electronically Signed   By: Lajean Manes M.D.   On: 07/03/2014 16:39   Mr Jeri Cos HF Contrast  07/18/2014   CLINICAL DATA:  Metastatic lung cancer.  EXAM: MRI HEAD WITHOUT AND WITH CONTRAST  TECHNIQUE: Multiplanar, multiecho pulse sequences of the brain and surrounding structures were obtained without and with intravenous contrast.  CONTRAST:  64m MULTIHANCE GADOBENATE DIMEGLUMINE 529 MG/ML IV SOLN  COMPARISON:  Head CT 07/10/2014 and MRI 07/06/2014  FINDINGS: Prominent susceptibility artifact associated with vascular structures throughout the brain may be secondary to IV medication administration such  as iron therapy. This also results in intravascular T1 shortening on precontrast images as well. There is no evidence of acute infarct or midline shift. There is moderate cerebral atrophy.  There is small volume subdural hemorrhage overlying the left cerebral hemisphere, greatest in the parieto-occipital region and overlying the left frontal lobe. The overall volume of supratentorial subdural hemorrhage is similar to the prior MRI, although there has been some redistribution. Subdural collection over the left frontal lobe is similar in size to the prior MRI, measuring 6-7 mm in thickness and again with signal characteristics different than the hematoma more posteriorly in the left cerebral hemisphere, possibly reflecting blood of different ages. A small amount of subdural hematoma is also present along the left falx. Subdural hematoma in the posterior fossa has significantly decreased in size.  Abnormal FLAIR signal is again seen within multiple left frontoparietal sulci near the vertex. No definite leptomeningeal enhancement is identified in this region on the current study, with hyperintense material at the left vertex on postcontrast images demonstrating intrinsic T1 hyperintensity on precontrast images. Some pachymeningeal enhancement/ thickening is likely present in this area. Focal T2 hypointense material in a left parafalcine location at the vertex is stable to slightly smaller than on the prior MRI and may reflect blood products, again with mild mass effect on the superior sagittal sinus. No definite parenchymal brain edema is seen in this region.  Mild periventricular white matter chronic small vessel ischemic disease is noted. No definite osseous skull metastases are identified. Mild dural thickening is again noted along the left tentorium.  Orbits are unremarkable. Paranasal sinuses and mastoid air cells are clear. Major intracranial vascular flow voids are preserved.  IMPRESSION: 1. Small volume  left-sided supratentorial subdural hemorrhage, overall similar in volume to the prior MRI with some interval redistribution. Posterior fossa subdural hematoma has significantly decreased in size. 2. Persistent abnormal subdural and subarachnoid material at the left vertex, again without associated brain edema or definite enhancing mass on the current examination. This appears to largely represent hematoma, however an underlying metastasis, such as a dural metastatic deposit, remains a consideration. Short-term follow-up MRI may be helpful once the subdural hemorrhage has resolved  to assess for an underlying lesion. 3. No evidence of parenchymal brain metastases.   Electronically Signed   By: Logan Bores   On: 07/18/2014 12:51   Mr Jeri Cos Wo Contrast  07/06/2014   CLINICAL DATA:  74 year old male with metastatic disease detected on recent neck, chest CT and lumbar MRI. 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 Biopsy pending. Staging. Subsequent encounter.  EXAM: MRI HEAD WITHOUT AND WITH CONTRAST  TECHNIQUE: Multiplanar, multiecho pulse sequences of the brain and surrounding structures were obtained without and with intravenous contrast.  CONTRAST:  53m MULTIHANCE GADOBENATE DIMEGLUMINE 529 MG/ML IV SOLN  COMPARISON:  Neck CT 07/03/2014.  FINDINGS: Overlying the left superior frontal gyrus there is a 30 x 37 x 8 mm area of abnormal signal and enhancement involving the dura; pachy- and leptomeninges, and also the calvarium. 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 See series 7, image 23, post-contrast series 11, image 52 series 13, image 14 and series 12, image 12. Still, there is no associated cortical or cerebral edema, in the adjacent bone marrow signal appears normal, without an infiltrative osseous lesion in this area. There are some chronic leptomeningeal or dural blood products (series 8, images 23 and  24).  There is a superimposed left posterior fossa subdural hematoma measuring up to 11 mm in thickness which appears to be new since 07/03/2014. There is a hematocrit level associated. See series 6, image 7 and series 7, image 7. The hematoma wraps around the left cerebellar hemisphere, but there is no posterior fossa midline shift or basilar cistern effacement. There is also a left supratentorial subdural hematoma measuring up to 5-6 mm in thickness series 7, image 15 and series 10, image 11). Associated susceptibility artifact including on trace diffusion imaging.  Trace supratentorial rightward midline shift. No ventricular effacement. No other significant intracranial mass effect.  There is smooth thickening and hyperenhancement of the left tentorium (series 12, image 10) without associated leptomeningeal enhancement, likely reactive due to the subdural blood.  No other abnormal intracranial enhancement identified. No parenchymal tumor or metastasis identified.  Grossly negative visualized cervical spine and cervical spinal cord.  No restricted diffusion or evidence of acute infarction. No intraventricular hemorrhage or ventriculomegaly. Major intracranial vascular flow voids are preserved, including the superior sagittal sinus which is marginally affected by the left superior frontal lesion described in the first paragrah. Normal for age gray and white matter signal. Negative pituitary and cervicomedullary junction.  Visible internal auditory structures appear normal. Mastoids are clear. Trace paranasal sinus mucosal thickening. Visualized orbit soft tissues are within normal limits. Visualized scalp soft tissues are within normal limits. Bone marrow signal aside from that described at the left vertex is within normal limits.  IMPRESSION: 1. Left side subdural hematomas in both the posterior fossa and along the left hemisphere, appear to be new since the neck CT on 07/03/2014. 2. Abnormal appearance of a 3-4  cm area of the pachy- and leptomeninges at the left vertex over the left superior frontal gyrus. 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 In the absence of a known malignancy, the differential diagnosis of this dural abnormality would include a dural vascular malformation. 3. No cerebral or cerebellar edema. No significant intracranial mass effect at this time. 4. No other  putative metastatic disease identified about the brain; smooth left tentorial dural thickening is likely reactive due to the hemorrhage. Study discussed by telephone with Dr. Cline Cools on 07/06/2014 at 1443 hours.   Electronically Signed   By: Genevie Ann M.D.   On: 07/06/2014 14:55   Mr Lumbar Spine Wo Contrast  07/04/2014   CLINICAL DATA:  Severe low back pain radiating into the right hip and leg. Symptoms for 1 year.  EXAM: MRI LUMBAR SPINE WITHOUT CONTRAST  TECHNIQUE: Multiplanar, multisequence MR imaging of the lumbar spine was performed. No intravenous contrast was administered.  COMPARISON:  CT abdomen and pelvis 01/07/2014. PET CT scan 08/17/2011.  FINDINGS: Multiple foci of abnormal marrow signal are identified post consistent with metastatic disease or less likely multiple myeloma. L4 is completely replaced by tumor. A large lesion is also identified and L5 eccentric to the right extending into the right pedicle. Multiple additional lesions are seen including the posterior right and left ilium and sacrum. Paraspinous soft tissue structures demonstrate abnormal signal to the right of the L3 vertebral body extending into the psoas musculature and posterior paraspinous musculature inferiorly to the level of L4-5. Epidural tumor is seen from L3 to L5 with areas of extension out of both the vertebral body and posterior elements. The conus medullaris is normal  in signal and position. Imaged intra-abdominal contents show partial visualization of abdominal aortic aneurysm which is been repaired.  The T11-12 level is imaged in the sagittal plane only and negative.  T12-L1: Small central protrusion is identified without central canal or foraminal narrowing.  L1-2:  Negative.  L2-3: Shallow disc bulge to the left without central canal or foraminal narrowing.  L3-4: Epidural tumor on the right extends into the right foramen and right paravertebral space and encroaches on the right L3 root and descending right L4 root. The left foramen appears open. There is also disc bulge this level.  L4-5: Epidural tumor extends out of the vertebral body narrowing the central canal with bulky tumor extension into the right paravertebral space and foramen encroaching on the exiting right L4 root.  L5-S1: Large epidural tumor deposits are seen extending out of the right lamina of L5 causing marked narrowing of the central canal and right lateral recess with encroachment on the descending and exiting right L5 root. There is a shallow disc bulge at this level.  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 Most notably affected levels are from L3 to L5-S1 level.  Findings were discussed with Dr. Carmin Muskrat at the time of interpretation.   Electronically Signed   By: Inge Rise M.D.   On: 07/04/2014 10:02   Mr Thoracic Spine W Wo Contrast  07/16/2014   CLINICAL DATA:  Metastatic disease unknown primary.  Lung nodule.  EXAM: MRI THORACIC SPINE WITHOUT AND WITH CONTRAST  TECHNIQUE: Multiplanar and multiecho pulse sequences of the thoracic spine were obtained without and with intravenous contrast.  CONTRAST:  92m MULTIHANCE GADOBENATE DIMEGLUMINE 529 MG/ML IV SOLN  COMPARISON:  CT chest  07/03/2014  FINDINGS: Image quality degraded by motion.  Skeletal metastatic disease is present in multiple vertebral bodies and multiple ribs. Moderate pleural effusion bilaterally, possibly malignant given the multiple rib lesions.  There is tumor in the T3 vertebral body extending into the pedicle and posterior elements. There is mild epidural tumor on  the right in the foramen and canal without cord compression. Tumor is present in the T4 vertebral body with probable mild epidural tumor on the right. There is tumor in the T5 vertebral body without definite epidural tumor. There is tumor in the T11 vertebral body without epidural tumor. I  Negative for pathologic fracture.  No cord compression.  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 There is epidural tumor on the right at the T3 and T4 levels with mild spinal stenosis but no cord compression. Spinal cord signal remains normal  Bilateral pleural effusions could be malignant.   Electronically Signed   By: Franchot Gallo M.D.   On: 07/16/2014 12:12   Mr Lumbar Spine W Wo Contrast  07/14/2014   CLINICAL DATA:  74 year old male with history of osseous metastatic disease. Primary tumor not specified (possibly thyroid close. Worsening back pain. Subsequent encounter.  EXAM: MRI LUMBAR SPINE WITHOUT AND WITH CONTRAST  TECHNIQUE: Multiplanar and multiecho pulse sequences of the lumbar spine were obtained without and with intravenous contrast.  CONTRAST:  56m MULTIHANCE GADOBENATE DIMEGLUMINE 529 MG/ML IV SOLN  COMPARISON:  07/04/2014 MR.  FINDINGS: Last fully open disk space is labeled L5-S1. Present examination incorporates from upper T12 through lower sacrum.  New from the prior examination is epidural hematoma dorsal aspect of the canal extending from mid L3 to the T12 level with maximal thickness at the L2 and L3 level measuring up to 6.5 mm AP dimension. This contributes to thecal sac narrowing with  anterior displacement of the contained nerve roots most notable L2 level. The superior extent of this epidural hematoma was not imaged on the present exam. Thoracic/cervical spine MR can be obtained for further delineation if clinically desired.  Diffuse osseous metastatic disease involving all visualized osseous structures. This is most notable involving the L4 and L5 vertebral body with progressive osseous metastatic disease as best appreciated posterior aspect of the L3 vertebral body. Epidural extension of tumor greatest at the L3-4 through L5 pedicle level with extension into the right L3-4 and L4-5 and left L4-5 neural foramen. Extension of tumor into the psoas muscles and paraspinal small slit greater on the right. Degenerative changes combined with epidural tumor causes L3-4 multifactorial marked thecal sac narrowing, L4-5 severe thecal sac narrowing and L5-S1 mild to moderate thecal sac narrowing.  The patient has elevated white count. The epidural disease is more suggestive of tumor and hemorrhage rather than infection (which cannot be entirely excluded).  Abdominal aortic aneurysm treated with endovascular stenting. Dilated left common iliac artery measuring up to 2.1 cm.  IMPRESSION: New epidural hematoma dorsal aspect of the canal extending from mid L3 to the T12 level with maximal thickness at the L2 and L3 level measuring up to 6.5 mm AP dimension. This contributes to thecal sac narrowing with anterior displacement of the contained nerve roots most notable L2 level. The superior extent of this epidural hematoma was not imaged on the present exam. Thoracic/cervical spine MR can be obtained for further delineation if clinically desired.  Diffuse osseous metastatic disease involving all visualized osseous structures. This is most notable involving the L4 and L5 vertebral body with progressive osseous metastatic disease as best appreciated posterior aspect of the L3 vertebral body. Epidural extension of  tumor greatest at the L3-4 through L5 pedicle level with extension into the right L3-4 and L4-5 and left L4-5 neural foramen. Extension of tumor into the psoas muscles and paraspinal small slit greater on the  right. Degenerative changes combined with epidural tumor causes L3-4 multifactorial marked thecal sac narrowing, L4-5 severe thecal sac narrowing and L5-S1 mild to moderate thecal sac narrowing.  Please see above discussion.  These results were called by telephone at the time of interpretation on 07/14/2014 at 2:34 pm to Dr. Mart Piggs , who verbally acknowledged these results.   Electronically Signed   By: Genia Del M.D.   On: 07/14/2014 14:39   Nm Bone Scan Whole Body  07/08/2014   CLINICAL DATA:  74 year old male with bony metastatic disease.  EXAM: NUCLEAR MEDICINE WHOLE BODY BONE SCAN  TECHNIQUE: Whole body anterior and posterior images were obtained approximately 3 hours after intravenous injection of radiopharmaceutical.  RADIOPHARMACEUTICALS:  25.1 Technetium-72mMDP IV  COMPARISON:  Recent imaging.  FINDINGS: Scattered areas of abnormal increased activity within multiple bilateral ribs are identified compatible with bony lesions/ metastatic disease on recent chest CT. The most pronounced areas include the posterior right seventh rib in the anterior left fifth rib.  No suspicious bony abnormalities are identified.  Degenerative changes within the shoulders and knees bilaterally are noted.  A focus of increased activity within the lateral soft tissues of the upper left arm is indeterminate.  IMPRESSION: Abnormal increased bilateral rib activity compatible with metastatic disease. No other suspicious bony abnormalities identified.  Focus of increased activity within the lateral soft tissues of the upper left arm -indeterminate. Correlate clinically.  Degenerative changes as described.   Electronically Signed   By: JMargarette CanadaM.D.   On: 07/08/2014 13:11   [place]oreaSoft Tissue Head/neck  07/06/2014    CLINICAL DATA:  Hyperthyroidism  EXAM: THYROID ULTRASOUND  TECHNIQUE: Ultrasound examination of the thyroid gland and adjacent soft tissues was performed.  COMPARISON:  None ; correlation CT soft tissue neck 07/03/2014  FINDINGS: Right thyroid lobe  Measurements: 6.2 x 4.3 x 2.5 cm. Tiny calcification 2 mm diameter within RIGHT lobe. Diffusely inhomogeneous echogenicity. No discrete mass.  Left thyroid lobe  Measurements: 5.4 x 2.6 x 3.2 cm. Heterogeneous echogenicity without discrete mass.  Isthmus  Thickness: 15 mm thick, thickened. Inhomogeneous echogenicity without focal mass.  Lymphadenopathy  Numerous lymph nodes identified in the cervical regions bilaterally up to 10 mm short axis diameters.  IMPRESSION: Enlarged inhomogeneous thyroid gland with a single nonspecific calcification in the RIGHT lobe.  No discrete thyroid mass identified.  Scattered cervical lymph nodes normal to mildly enlarged in sizes.  Patient received IV contrast material on 07/03/2014 and again on 07/05/2014; the iodine load within these contrast administrations will prevent performance of an accurate radionuclide thyroid uptake and scan as well as potential radioactive iodine therapy for hyperthyroidism for 6-8 weeks.   Electronically Signed   By: MLavonia DanaM.D.   On: 07/06/2014 13:30   Ct Abdomen Pelvis W Contrast  07/05/2014   CLINICAL DATA:  Continued staging of newly diagnosed bone lesions. WBC=16.3*  EXAM: CT ABDOMEN AND PELVIS WITH CONTRAST  TECHNIQUE: Multidetector CT imaging of the abdomen and pelvis was performed using the standard protocol following bolus administration of intravenous contrast.  CONTRAST:  561mOMNIPAQUE IOHEXOL 300 MG/ML SOLN, 10085mMNIPAQUE IOHEXOL 300 MG/ML SOLN  COMPARISON:  01/07/2014  FINDINGS: Moderate right small left pleural effusions. There is significant left right lower lobe atelectasis. No pulmonary edema. Heart is normal in size. No lung base mass or discrete nodule.  Liver, spleen,  gallbladder, pancreas, adrenal glands:  Unremarkable.  Small low-density renal lesions, most likely cysts. Relative area of decreased enhancement  in the inferior medial aspect the left kidney, which is stable from the prior CT. Small cysts have increased in size since the prior study. No other renal abnormality. No hydronephrosis. Normal ureters. Bladder is unremarkable.  Excluded infrarenal abdominal aortic aneurysm. Native aneurysm measures 6.2 cm x 4.9 cm transversely. 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 These findings are relatively stable.  No pathologically enlarged lymph nodes.  No ascites.  Colon and small bowel are unremarkable. Appendix not convincingly seen.  Degenerative changes noted of the lumbar spine. There is a mottled appearance of vertebrae, but no discrete osteoblastic or osteolytic lesion. The widespread lesions evident on MRI are not well resolved high CT.  IMPRESSION: 1. No acute finding within the abdomen pelvis. 2. No extraosseous metastatic disease or evidence of a primary neoplasm. 3. 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 The visible vertebrae show a subtle heterogeneous pattern without a discrete osteoblastic or osteolytic lesion. 4. Moderate right and small to moderate left pleural effusions with significant associated lower lobe atelectasis. No lung base nodules.   Electronically Signed   By: Lajean Manes M.D.   On: 07/05/2014 09:39   Dg Esophagus  07/07/2014   CLINICAL DATA:  Severe dysphagia. Metastatic disease of unknown primary.  EXAM: ESOPHOGRAM/BARIUM SWALLOW  TECHNIQUE: Single contrast examination was performed using  thin barium.  FLUOROSCOPY TIME:  Fluoroscopy Time:  2 minutes 36 seconds  Number of Acquired Images:  7  COMPARISON:  None.  FINDINGS: A small amount of aspiration of barium was immediately seen, which elicited a cough reflex. This limited ability to continue with the exam. Small  amount of administered barium shows no evidence of esophageal mass or stricture, although esophagus could not be completely distended. No hiatal hernia visualized.  IMPRESSION: Suboptimal exam due to immediate aspiration, which elicited a cough reflex. No esophageal mass or stricture identified.   Electronically Signed   By: Earle Gell M.D.   On: 07/07/2014 16:35   US Biopsy  07/11/2014   INDICATION: Concern for metastatic lung cancer. Please perform repeat ultrasound-guided cervical lymph node biopsy for additional tissue for molecular testing.  EXAM: ULTRASOUND-GUIDED CERVICAL LYMPH NODE BIOPSY  COMPARISON:  Chest CT - 07/03/2014; thyroid ultrasound - 07/06/2014; ultrasound-guided left cervical lymph node biopsy - 07/07/2014  MEDICATIONS: None  ANESTHESIA/SEDATION: None  COMPLICATIONS: None immediate  TECHNIQUE: Informed written consent was obtained from the patient after a discussion of the risks, benefits and alternatives to treatment. Questions regarding the procedure were encouraged and answered. Initial ultrasound scanning demonstrated multiple shotty enlarged and borderline enlarged cervical lymph nodes bilaterally. As patient had undergone prior left-sided cervical lymph node biopsy, the decision was made to target the dominant right-sided nodal conglomeration in the hopes of obtaining additional tissue for molecular testing. An ultrasound image was saved for documentation purposes. The procedure was planned. A timeout was performed prior to the initiation of the procedure.  The operative was prepped and draped in the usual sterile fashion, and a sterile drape was applied covering the operative field. A timeout was performed prior to the initiation of the procedure. Local anesthesia was provided with 1% lidocaine with epinephrine.  Under direct ultrasound guidance, an 18 gauge core needle device was utilized to obtain to obtain 7 core needle biopsied of the dominant nodal conglomeration within the mid /  inferior aspect of the right neck. Multiple ultrasound images were saved for documentation purposes.  The samples  were placed in saline and submitted to pathology. The needle was removed and hemostasis was achieved with manual compression. Post procedure scan was negative for significant hematoma. A dressing was placed. The patient tolerated the procedure well without immediate postprocedural complication.  IMPRESSION: Technically successful ultrasound guided right cervical lymph node biopsy for additional tissue for molecular testing.   Electronically Signed   By: Sandi Mariscal M.D.   On: 07/11/2014 11:28   US Biopsy  07/07/2014   CLINICAL DATA:  74 year old male with a history of lymphadenopathy, concern for thyroid cancer metastasis  EXAM: ULTRASOUND GUIDED CORE BIOPSY OF LEFT CERVICAL LYMPH NODES  MEDICATIONS: 1.0 mg IV Versed; 0 mcg IV Fentanyl  Total Moderate Sedation Time: 0  PROCEDURE: The procedure, risks, benefits, and alternatives were explained to the patient. Questions regarding the procedure were encouraged and answered. The patient understands and consents to the procedure.  Ultrasound survey of the left neck was performed with images stored and sent to PACs.  The left neck was prepped with Betadine in a sterile fashion, and a sterile drape was applied covering the operative field. A sterile gown and sterile gloves were used for the procedure. Local anesthesia was provided with 1% Lidocaine.  Once the patient was prepped and draped sterilely, the skin and subcutaneous tissues were generously infiltrated with 1% lidocaine without epinephrine. Ultrasound guidance was used to infiltrate the skin and subcutaneous tissues to the level of the targeted nodes.  A small skin incision was made with 11 blade scalpel, and then and 18 gauge core biopsy gun was used to retrieve 5 separate 18 gauge core biopsy. Each of these was observed under ultrasound.  Tissue specimen sent to pathology in saline.  The patient  tolerated the procedure well and remained hemodynamically stable throughout.  No complications were encountered and no significant blood loss was encountered.  Sterile bandage was placed.  EBL = 0  COMPLICATIONS: None.  FINDINGS: Ultrasound survey demonstrates multiple abnormal nodes along the left cervical chain.  Images during the case demonstrate needle placement within the nodes on each pass.  Final image demonstrates no complicating features.  IMPRESSION: Status post ultrasound-guided biopsy of left cervical lymph nodes. Specimen sent to pathology for complete histopathologic analysis.  Signed,  Dulcy Fanny. Earleen Newport, DO  [facility]  [facility]   Electronically Signed   By: Corrie Mckusick D.O.   On: 07/07/2014 13:43   Dg Chest Port 1 View  07/16/2014   CLINICAL DATA:  Cough.  Metastatic non-small cell carcinoma.  EXAM: PORTABLE CHEST - 1 VIEW  COMPARISON:  07/09/2014  FINDINGS: PICC line is in stable position within the SVC. Lungs show stable appearance of bilateral small pleural effusions with associated bilateral lower lobe atelectasis. Mild interstitial prominence remains in both lungs without evidence of overt airspace edema. The heart size is normal. No pneumothorax.  IMPRESSION: Stable bilateral pleural effusions with associated bilateral lower lobe atelectasis.   Electronically Signed   By: Aletta Edouard M.D.   On: 07/16/2014 08:32   Dg Chest Port 1 View  07/09/2014   CLINICAL DATA:  PICC placement  EXAM: PORTABLE CHEST - 1 VIEW  COMPARISON:  07/07/2014  FINDINGS: Right arm PICC tip in the mid SVC in good position.  Right upper lobe airspace disease unchanged. Bibasilar airspace disease and bilateral effusions unchanged.  IMPRESSION: Right arm PICC tip in the SVC  Right upper lobe airspace disease may represent edema or pneumonia. Bibasilar atelectasis and effusion unchanged.  Electronically Signed   By: Franchot Gallo M.D.   On: 07/09/2014 09:13   Dg  Chest Port 1 View  07/07/2014   CLINICAL DATA:  Aspiration.  Hypoxia.  Evaluate for pneumonia.  EXAM: PORTABLE CHEST - 1 VIEW  COMPARISON:  07/06/2014  FINDINGS: Normal heart size. There is calcified atherosclerosis involving the thoracic aorta. Bilateral pleural effusions are identified. There is mild diffuse interstitial edema. Airspace consolidation with air bronchograms are noted in the left lower lobe.  IMPRESSION: 1. Left lower lobe airspace consolidation. 2. Bilateral pleural effusions and interstitial edema consistent with pneumonia.   Electronically Signed   By: Kerby Moors M.D.   On: 07/07/2014 21:07   Dg Swallowing Func-speech Pathology  07/08/2014    Objective Swallowing Evaluation:    Patient Details  Name: [name] [name] MRN: [id number] Date of Birth: [birth year]  Today's Date: 07/08/2014 Time: SLP Start Time (ACUTE ONLY): 1235-SLP Stop Time (ACUTE ONLY): 1305 SLP Time Calculation (min) (ACUTE ONLY): 30 min  Past Medical History:  Past Medical History  Diagnosis Date  . Lung nodule 2012  . Insomnia     takes Restoril nightly as needed  . Hypertension     takes Benazepril and Metoprolol daily  . Pneumonia     hx of;many yrs ago  . AAA (abdominal aortic aneurysm)   . Joint pain   . Joint swelling   . Urinary frequency     takes Terazosin daily  . Urinary urgency   . Nocturia   . Enlarged prostate     slightly  . Rheumatoid arthritis(714.0)     takes Plaquenil daily and Methotrexate weekly   Past Surgical History:  Past Surgical History  Procedure Laterality Date  . Inguinal hernia repair  2008  . Appendectomy    . Abdominal aortic endovascular stent graft Bilateral 12/11/2013    Procedure: ABDOMINAL AORTIC ENDOVASCULAR STENT GRAFT; left groin  exploration;  Surgeon: Rosetta Posner, MD;  Location: [facility] OR;  Service:  Vascular;  Laterality: Bilateral;   HPI:  Other Pertinent Information: 74 year old male with a known diagnosis of  COPD who came to the hospital because of some weight loss, shortness of  breath,  and back pain. He was found to have CT imaging findings suggestive  of widespread metastasis from an uncertain primary malignancy.  Pt was  admitted for further workup with oncology and ENT per MD notes.  Per CT  neck, pt with massive enlargement of thyroid lesion (left lobe) with  phlegmon/inflammation 07/03/14.   Difficulty swallowing food more than drink  reported by pt -progressing within the last few weeks.  Pt states swelling  in neck present x1 month but progressed in 2 weeks.  Esophagram completed  with pt having aspiration - esophagus negative.  Pt now with pna per CXR.   MD reordered SLP swallow evaluation due to pt coughing with intake.    No Data Recorded  Assessment / Plan / Recommendation CHL IP CLINICAL IMPRESSIONS 07/08/2014  Therapy Diagnosis Severe cervical esophageal phase dysphagia  Clinical Impression Pt has a severe cervical esophageal phase dysphagia  presumed to be due obstructive to left thyroid mass/lymph node  involvement.  Gross pharyngeal stasis noted with minimal barium transiting  into esophagus and aspiration.  Multtiple strategies attempted to aid  clearance including head turn, chin tuck, following solids with liquids,  etc.  Fortunately pt was able to expectorate barium mixed with secretions  throughout the study (expectorating approximately 90% of boluses).  He did  aspirate a small amount of liquids without protective cough response.  Pt  is at very high malnutrition and aspiration risk due to his level of  dysphagia.  ? if pt would benefit from consideration for alternative means  of nutrition for nutriional support as he goes through treatment.  Using  live video, SLP educated pt to findings and concerns for aspiraton and  nutrition.  Recommend consider NPO except ice and water to decrease disuse  muscle atrophy.   Hopeful for improvement in swallowing with treatment.        CHL IP TREATMENT RECOMMENDATION 07/08/2014  Treatment Recommendations Therapy as outlined in treatment plan  below     CHL IP DIET RECOMMENDATION 07/08/2014  SLP Diet Recommendations NPO except water/ice to prevent disuse muscle  atrophy  Liquid Administration via (None)  Medication Administration Via alternative means  Compensations (None)  Postural Changes and/or Swallow Maneuvers (None)     CHL IP OTHER RECOMMENDATIONS 07/08/2014  Recommended Consults (None)  Oral Care Recommendations Oral care BID  Other Recommendations (None)     No flowsheet data found.   CHL IP FREQUENCY AND DURATION 07/08/2014  Speech Therapy Frequency (ACUTE ONLY) (None)  Treatment Duration 1 week     Pertinent Vitals/Pain     SLP Swallow Goals No flowsheet data found.  No flowsheet data found.    CHL IP REASON FOR REFERRAL 07/08/2014  Reason for Referral Objectively evaluate swallowing function     CHL IP ORAL PHASE 07/08/2014  Lips (None)  Tongue (None)  Mucous membranes (None)  Nutritional status (None)  Other (None)  Oxygen therapy (None)  Oral Phase Impaired  Oral - Pudding Teaspoon (None)  Oral - Pudding Cup (None)  Oral - Honey Teaspoon (None)  Oral - Honey Cup (None)  Oral - Honey Syringe (None)  Oral - Nectar Teaspoon (None)  Oral - Nectar Cup (None)  Oral - Nectar Straw (None)  Oral - Nectar Syringe (None)  Oral - Ice Chips (None)  Oral - Thin Teaspoon (None)  Oral - Thin Cup (None)  Oral - Thin Straw (None)  Oral - Thin Syringe (None)  Oral - Puree (None)  Oral - Mechanical Soft (None)  Oral - Regular (None)  Oral - Multi-consistency (None)  Oral - Pill (None)  Oral Phase - Comment (None)      CHL IP PHARYNGEAL PHASE 07/08/2014  Pharyngeal Phase Impaired  Pharyngeal - Pudding Teaspoon (None)  Penetration/Aspiration details (pudding teaspoon) (None)  Pharyngeal - Pudding Cup (None)  Penetration/Aspiration details (pudding cup) (None)  Pharyngeal - Honey Teaspoon (None)  Penetration/Aspiration details (honey teaspoon) (None)  Pharyngeal - Honey Cup (None)  Penetration/Aspiration details (honey cup) (None)  Pharyngeal - Honey Syringe (None)   Penetration/Aspiration details (honey syringe) (None)  Pharyngeal - Nectar Teaspoon (None)  Penetration/Aspiration details (nectar teaspoon) (None)  Pharyngeal - Nectar Cup (None)  Penetration/Aspiration details (nectar cup) (None)  Pharyngeal - Nectar Straw (None)  Penetration/Aspiration details (nectar straw) (None)  Pharyngeal - Nectar Syringe (None)  Penetration/Aspiration details (nectar syringe) (None)  Pharyngeal - Ice Chips (None)  Penetration/Aspiration details (ice chips) (None)  Pharyngeal - Thin Teaspoon (None)  Penetration/Aspiration details (thin teaspoon) (None)  Pharyngeal - Thin Cup (None)  Penetration/Aspiration details (thin cup) (None)  Pharyngeal - Thin Straw (None)  Penetration/Aspiration details (thin straw) (None)  Pharyngeal - Thin Syringe (None)  Penetration/Aspiration details (thin syringe') (None)  Pharyngeal - Puree (None)  Penetration/Aspiration details (puree) (None)  Pharyngeal - Mechanical Soft (None)  Penetration/Aspiration details (mechanical  soft) (None)  Pharyngeal - Regular (None)  Penetration/Aspiration details (regular) (None)  Pharyngeal - Multi-consistency (None)  Penetration/Aspiration details (multi-consistency) (None)  Pharyngeal - Pill (None)  Penetration/Aspiration details (pill) (None)  Pharyngeal Comment pt able to expectorate residuals with cues - multiple  compensation strategies attempted to aid clearance into esophagus without  avail      CHL IP CERVICAL ESOPHAGEAL PHASE 07/08/2014  Cervical Esophageal Phase Impaired  Pudding Teaspoon (None)  Pudding Cup (None)  Honey Teaspoon (None)  Honey Cup (None)  Honey Straw (None)  Nectar Teaspoon (None)  Nectar Cup (None)  Nectar Straw (None)  Nectar Sippy Cup (None)  Thin Teaspoon (None)  Thin Cup (None)  Thin Straw (None)  Thin Sippy Cup (None)  Cervical Esophageal Comment very poor clearance into esophagus due to  mechanical obstruction from thyroid mass    No flowsheet data found.         Luanna Salk, [facility] SLP  [phone]     Micro Results      Recent Results (from the past 240 hour(s))  Urine culture     Status: None   Collection Time: 07/16/14 11:26 AM  Result Value Ref Range Status   Specimen Description URINE, CLEAN CATCH  Final   Special Requests NONE  Final   Colony Count NO GROWTH Performed at [facility]   Final   Culture NO GROWTH Performed at [facility]   Final   Report Status 07/17/2014 FINAL  Final    Today   Subjective:   Scheryl Darter today has no headache,no chest abdominal pain,no new weakness tingling or numbness, feels much better .   Objective:   Blood pressure 174/63, pulse 69, temperature 98 F (36.7 C), temperature source Oral, resp. rate 18, height 5' 7"  (1.702 m), weight 62.7 kg (138 lb 3.7 oz), SpO2 94 %.   Intake/Output Summary (Last 24 hours) at 07/18/14 1259 Last data filed at 07/18/14 0932  Gross per 24 hour  Intake    380 ml  Output    200 ml  Net    180 ml    Exam Awake Alert, Oriented x 3, No new F.N deficits, has depressed and flat affect, right lower extremity weaker than the left strength 4 over 5 in both lower legs [place].AT,PERRAL Supple Neck,No JVD, No cervical lymphadenopathy appriciated.  Symmetrical Chest wall movement, Good air movement bilaterally, CTAB RRR,No Gallops,Rubs or new Murmurs, No Parasternal Heave +ve B.Sounds, Abd Soft, Non tender, No organomegaly appriciated, No rebound -guarding or rigidity. No Cyanosis, Clubbing or edema, No new Rash or bruise  Data Review   CBC w Diff:  Lab Results  Component Value Date   WBC 22.4* 07/18/2014   HGB 8.7* 07/18/2014   HCT 26.2* 07/18/2014   PLT 117* 07/18/2014   LYMPHOPCT 2* 07/17/2014   MONOPCT 5 07/17/2014   EOSPCT 0 07/17/2014   BASOPCT 0 07/17/2014    CMP:  Lab Results  Component Value Date   NA 138 07/18/2014   K 3.7 07/18/2014   CL 107 07/18/2014   CO2 23 07/18/2014   BUN 29* 07/18/2014   CREATININE 0.36* 07/18/2014   CREATININE 0.76 11/13/2013     PROT 4.4* 07/18/2014   ALBUMIN 2.3* 07/18/2014   BILITOT 0.9 07/18/2014   ALKPHOS 73 07/18/2014   AST 21 07/18/2014   ALT 36 07/18/2014  .   Total Time in preparing paper work, data evaluation and todays exam - 35 minutes  Thurnell Lose M.D on  07/18/2014 at 12:59 PM  [facility]   Office  [phone]

## 2014-07-18 NOTE — Progress Notes (Signed)
[name] since we doing fairly well. He's doing OK with radiation. He is trying to get some physical therapy.  Unfortunately, he will have to be discharged. His insurance is not going to let him stay in the hospital for his radiation. I think he finished up his radiation next week.  He was seen by palliative care. I appreciate their input.  I think the real key as far as his prognosis and treatment options will be genetic analysis of his tumor. Again, since he is Asian and has not smoked for 35-40 years, he might be one of the few patients who does harbor a mutation that we can target with an oral drug. If so, that his prognosis is clearly will be much better.  He is incredibly worried about leaving the hospital and not getting the care that he needs at the rehabilitation facility. I am most worried about him not being able to get the physical therapy that can help him out. I worry that he just will be left and not much will happen with him.  He probably needs to have his PICC line removed. This I think would only be a source of infection as an outpatient.  He's had no issues with anal. He is eating fairly well. He's had no nausea or vomiting. His breathing seems to be doing better.  His lab work looks fairly stable. His hemoglobin is 8.7. His platelet count is 117. I think this probably is a little down because of his radiation treatments.  His potassium looks okay. His calcium looks okay.  He's had no problems with nausea or vomiting.  His vital signs look okay although his blood pressure is on the high side. Hopefully this will be better controlled as an outpatient. His lungs sound clear. Cardiac exam regular rate and rhythm. His thyroid does not appear to be swollen. Abdomen is soft. Extremities shows decent range of motion of the left leg. Right thigh still has some weakness. He has some dorsiflexion weakness on the right foot.  For now, I guess we have no choice but to have him be  discharged. It will be a challenge to try to manage him as an outpatient. He really does not have a family doctor. He has a hard time getting around to see doctors because of his immobility from his metastatic disease.  I suspect that he ultimately will end up back in the hospital with some complication.  Pete E.

## 2014-07-18 NOTE — Progress Notes (Signed)
Pt leaving at this time with [facility]. Son in room. Pt alert and oriented;aware of transfer. Belongings bagged up by son. Pt going to [facility] on [address].

## 2014-07-18 NOTE — Clinical Social Work Placement (Signed)
   CLINICAL SOCIAL WORK PLACEMENT  NOTE  Date:  07/18/2014  Patient Details  Name: [name] [name] MRN: [id number] Date of Birth: [birth year]  Clinical Social Work is seeking post-discharge placement for this patient at the [place] level of care (*CSW will initial, date and re-position this form in  chart as items are completed):  Yes   Patient/family provided with [place] Work Department's list of facilities offering this level of care within the geographic area requested by the patient (or if unable, by the patient's family).  Yes   Patient/family informed of their freedom to choose among providers that offer the needed level of care, that participate in Medicare, Medicaid or managed care program needed by the patient, have an available bed and are willing to accept the patient.  Yes   Patient/family informed of [facility]'s ownership interest in [facility] and [facility], as well as of the fact that they are under no obligation to receive care at these facilities.  PASRR submitted to EDS on 07/08/14     PASRR number received on 07/08/14     Existing PASRR number confirmed on       FL2 transmitted to all facilities in geographic area requested by pt/family on 07/08/14     FL2 transmitted to all facilities within larger geographic area on       Patient informed that his/her managed care company has contracts with or will negotiate with certain facilities, including the following:        Yes   Patient/family informed of bed offers received.  Patient chooses bed at [facility]     Physician recommends and patient chooses bed at      Patient to be transferred to [facility] on 07/18/14.  Patient to be transferred to facility by [facility]     Patient family notified on 07/18/14 of transfer.  Name of family member notified:  Marlow Baars     PHYSICIAN       Additional Comment:     _______________________________________________ Bo Mcclintock, LCSW 07/18/2014, 4:53 PM

## 2014-07-18 NOTE — Discharge Instructions (Signed)
Follow with Primary MD PANG,RICHARD, MD in 7 days   Get CBC, CMP, TSH, Free T3, T4,   by SNF MD in 1 week.    Activity: As tolerated with Full fall precautions use walker/cane & assistance as needed   Disposition SNF   Diet: Heart Healthy with feeding assistance and aspiration precautions.  For Heart failure patients - Check your Weight same time everyday, if you gain over 2 pounds, or you develop in leg swelling, experience more shortness of breath or chest pain, call your Primary MD immediately. Follow Cardiac Low Salt Diet and 1.5 lit/day fluid restriction.   On your next visit with your primary care physician please Get Medicines reviewed and adjusted.   Please request your Prim.MD to go over all Hospital Tests and Procedure/Radiological results at the follow up, please get all Hospital records sent to your Prim MD by signing hospital release before you go home.   If you experience worsening of your admission symptoms, develop shortness of breath, life threatening emergency, suicidal or homicidal thoughts you must seek medical attention immediately by calling 911 or calling your MD immediately  if symptoms less severe.  You Must read complete instructions/literature along with all the possible adverse reactions/side effects for all the Medicines you take and that have been prescribed to you. Take any new Medicines after you have completely understood and accpet all the possible adverse reactions/side effects.   Do not drive, operating heavy machinery, perform activities at heights, swimming or participation in water activities or provide baby sitting services if your were admitted for syncope or siezures until you have seen by Primary MD or a Neurologist and advised to do so again.  Do not drive when taking Pain medications.    Do not take more than prescribed Pain, Sleep and Anxiety Medications  Special Instructions: If you have smoked or chewed Tobacco  in the last 2 yrs  please stop smoking, stop any regular Alcohol  and or any Recreational drug use.  Wear Seat belts while driving.   Please note  You were cared for by a hospitalist during your hospital stay. If you have any questions about your discharge medications or the care you received while you were in the hospital after you are discharged, you can call the unit and asked to speak with the hospitalist on call if the hospitalist that took care of you is not available. Once you are discharged, your primary care physician will handle any further medical issues. Please note that NO REFILLS for any discharge medications will be authorized once you are discharged, as it is imperative that you return to your primary care physician (or establish a relationship with a primary care physician if you do not have one) for your aftercare needs so that they can reassess your need for medications and monitor your lab values.

## 2014-07-18 NOTE — Progress Notes (Signed)
CSW called for transport with [facility]. Pt family and RN made aware. DC packet completed and given to RN.  CSW signing off but available as needs arise.   Peri Maris, LCSWA 07/18/2014 4:55 PM [phone]

## 2014-07-18 NOTE — Progress Notes (Signed)
Writer gave report to receiving nurse at [facility], at this time.

## 2014-07-21 ENCOUNTER — Ambulatory Visit
Admit: 2014-07-21 | Discharge: 2014-07-21 | Disposition: A | Discharge status: Home / Self Care | Payer: BC Managed Care – PPO | Attending: Radiation Oncology | Admitting: Radiation Oncology

## 2014-07-21 ENCOUNTER — Encounter: Payer: Self-pay | Admitting: Radiation Oncology

## 2014-07-21 ENCOUNTER — Ambulatory Visit
Admission: RE | Admit: 2014-07-21 | Discharge: 2014-07-21 | Disposition: A | Discharge status: Home / Self Care | Payer: BC Managed Care – PPO | Source: Ambulatory Visit | Attending: Radiation Oncology | Admitting: Radiation Oncology

## 2014-07-21 VITALS — BP 136/60 | HR 78 | Temp 97.6°F | Resp 12

## 2014-07-21 DIAGNOSIS — C7951 Secondary malignant neoplasm of bone: Secondary | ICD-10-CM | POA: Diagnosis not present

## 2014-07-21 DIAGNOSIS — Z510 Encounter for antineoplastic radiation therapy: Secondary | ICD-10-CM | POA: Diagnosis present

## 2014-07-21 DIAGNOSIS — R221 Localized swelling, mass and lump, neck: Secondary | ICD-10-CM | POA: Diagnosis not present

## 2014-07-21 DIAGNOSIS — C801 Malignant (primary) neoplasm, unspecified: Secondary | ICD-10-CM | POA: Diagnosis not present

## 2014-07-21 MED ORDER — SENNA 8.6 MG PO TABS: ORAL_TABLET | ORAL | Status: AC

## 2014-07-21 NOTE — Progress Notes (Signed)
He rates his pain as a 10 on a scale of 0-10. intermittent and sharp over right hip. Pt complains of loss of sleep. He is having a hard time going to sleep.  He is currently living at [facility].   Denies urinary difficulties.  Reports periods of constipation.  He reports he is having a bowel movement almost everyday but it is small and hard.  Recommended stool softeners and miralax.  Pt denies dysphagia.  Reports nausea with vomiting yesterday. Reports he constantly feels SOB.  He is currently on 3L O2 via nasal cannula. Reports a moist productive cough and has has bloody sputum.    BP 136/60 mmHg  Pulse 78  Temp(Src) 97.6 F (36.4 C) (Oral)  Resp 12  SpO2 97%

## 2014-07-21 NOTE — Progress Notes (Signed)
      Weekly Management Note:  Outpatient   ICD-9-CM ICD-10-CM   1. Bone metastases 198.5 C79.51 senna (SENOKOT) 8.6 MG TABS tablet    Current Dose:  24 Gy to spine and neck Projected Dose: 30 Gy   He has also completed 8 Gy to his ribs bilaterally and right hip  Narrative:  The patient presents for routine under treatment assessment.  CBCT/MVCT images/Port film x-rays were reviewed.  The chart was checked.    Patient reported continued right hip pain, numbness in his right leg.  Pain in ribs is somewhat better. Neck mass is much decreased.  Has some bloody sputum.  No back pain. Has some constipation. Now at [facility].  Anxious to see Dr Marin Olp to discuss chemotherapy    Physical Findings:  oral temperature is 97.6 F (36.4 C). His blood pressure is 136/60 and his pulse is 78. His respiration is 12 and oxygen saturation is 97%.   Wt Readings from Last 3 Encounters:  07/10/14 138 lb 3.7 oz (62.7 kg)  01/14/14 147 lb 3.2 oz (66.769 kg)  12/11/13 147 lb 4.3 oz (66.8 kg)  mild swelling in hands, but not ankles. Neck mass has decreased significantly. On gurney. ECOG 3  Impression:  The patient is tolerating radiotherapy.    Plan:  Continue radiotherapy as planned. Senna Rx for constipation  Discussed nutrition, hydration  Still on Decadron.  Refer to Dr Marin Olp ASAP per pt's wishes. He and family want to be as aggressive as possible.  They understand treatments will not be curative.  F/u in  2 weeks.  ________________________________   Eppie Gibson, M.D.

## 2014-07-22 ENCOUNTER — Non-Acute Institutional Stay (SKILLED_NURSING_FACILITY): Payer: BC Managed Care – PPO | Admitting: Internal Medicine

## 2014-07-22 ENCOUNTER — Encounter: Payer: Self-pay | Admitting: Internal Medicine

## 2014-07-22 ENCOUNTER — Ambulatory Visit
Admission: RE | Admit: 2014-07-22 | Discharge: 2014-07-22 | Disposition: A | Discharge status: Home / Self Care | Payer: BC Managed Care – PPO | Source: Ambulatory Visit | Attending: Radiation Oncology | Admitting: Radiation Oncology

## 2014-07-22 DIAGNOSIS — C801 Malignant (primary) neoplasm, unspecified: Secondary | ICD-10-CM

## 2014-07-22 DIAGNOSIS — C799 Secondary malignant neoplasm of unspecified site: Secondary | ICD-10-CM

## 2014-07-22 DIAGNOSIS — I10 Essential (primary) hypertension: Secondary | ICD-10-CM | POA: Diagnosis not present

## 2014-07-22 DIAGNOSIS — R112 Nausea with vomiting, unspecified: Secondary | ICD-10-CM

## 2014-07-22 DIAGNOSIS — E0500 Thyrotoxicosis with diffuse goiter without thyrotoxic crisis or storm: Secondary | ICD-10-CM | POA: Diagnosis not present

## 2014-07-22 DIAGNOSIS — Z510 Encounter for antineoplastic radiation therapy: Secondary | ICD-10-CM | POA: Diagnosis not present

## 2014-07-22 DIAGNOSIS — K5900 Constipation, unspecified: Secondary | ICD-10-CM | POA: Diagnosis not present

## 2014-07-22 DIAGNOSIS — R0602 Shortness of breath: Secondary | ICD-10-CM | POA: Diagnosis not present

## 2014-07-22 DIAGNOSIS — D630 Anemia in neoplastic disease: Secondary | ICD-10-CM | POA: Diagnosis not present

## 2014-07-22 DIAGNOSIS — M545 Low back pain: Secondary | ICD-10-CM | POA: Diagnosis not present

## 2014-07-22 NOTE — Progress Notes (Signed)
Patient ID: [name] WATERSON, male   DOB: Jul 29, 1940, 74 y.o.   MRN: [id number]    HISTORY AND PHYSICAL   DATE: 07/22/14  Location:  [facility]    Place of Service: SNF (31)   Extended Emergency Contact Information Primary Emergency Contact: Zhao,Rui Address: [address]          [place], [place] [address] [place] Phone: [phone] Relation: Spouse Secondary Emergency Contact: [place] Mobile Phone: [phone] Relation: Son  Advanced Directive information   FULL CODE  Chief Complaint  Patient presents with  . New Admit To SNF    HPI:  74 yo male seen today as a new admission into SNF following hospital stay for metastatic adenoCA of unknown primary, thyrotoxicosis, anemia. CT chest revealed massive thyroid gland and L>R necrotic cervical lymphadenopathy. MRI revealed subdural hematoma. He underwent right lymph node bx on 5/9th which revealed adenoCA. He was seen by oncology and Rad Onc and XRT begun due to tumor bulk and LE weakness. He was given IV steroids. He has several spinal mets on MRI. He was dx with left subclavian thrombosis. He was tx for aspiration pneumonia and completed abx prior to d/c. He had a pleural effusion which did not require thoracentesis.  Long term prognosis extremely poor per hospital and palliative care recommended  He c/o persistent N with emesis and cosnstipation unrelieved with suppository. Appetite poor. He has insomnia which is chronic.  HTN - stable on hydralazine, lotensin and metoprolol  RA - stable on plaquenil  GERD/dysphagia - stable on PPI. miralax not effective for constipation  Hyperthyroid with goiter - takes tapazole  Pain controlled on oxycodone. He uses duoneb prn. He also takes xanax prn  He takes several vitamins/minerals  Past Medical History  Diagnosis Date  . Lung nodule 2012  . Insomnia     takes Restoril nightly as needed  . Hypertension     takes  Benazepril and Metoprolol daily  . Pneumonia     hx of;many yrs ago  . AAA (abdominal aortic aneurysm)   . Joint pain   . Joint swelling   . Urinary frequency     takes Terazosin daily  . Urinary urgency   . Nocturia   . Enlarged prostate     slightly  . Rheumatoid arthritis(714.0)     takes Plaquenil daily and Methotrexate weekly    Past Surgical History  Procedure Laterality Date  . Inguinal hernia repair  2008  . Appendectomy    . Abdominal aortic endovascular stent graft Bilateral 12/11/2013    Procedure: ABDOMINAL AORTIC ENDOVASCULAR STENT GRAFT; left groin exploration;  Surgeon: Rosetta Posner, MD;  Location: [facility] OR;  Service: Vascular;  Laterality: Bilateral;    Patient Care Team: [name], MD as PCP - General (Internal Medicine)  History   Social History  . Marital Status: Married    Spouse Name: N/A  . Number of Children: N/A  . Years of Education: N/A   Occupational History  . Not on file.   Social History Main Topics  . Smoking status: Former Smoker    Types: Cigarettes  . Smokeless tobacco: Never Used     Comment: quit smoking at least 76yr ago  . Alcohol Use: No  . Drug Use: No  . Sexual Activity: Not Currently   Other Topics Concern  . Not on file   Social History Narrative     reports that he  has quit smoking. His smoking use included Cigarettes. He has never used smokeless tobacco. He reports that he does not drink alcohol or use illicit drugs.  Family History  Problem Relation Age of Onset  . Hypertension Father   . Stroke Father   . Hypertension Mother   . Diabetes Mother   . Hyperlipidemia Mother    Family Status  Relation Status Death Age  . Father Deceased 40  . Mother Deceased 52    Immunization History  Administered Date(s) Administered  . Influenza Whole 12/22/2008, 12/22/2009  . Pneumococcal Polysaccharide-23 04/04/2006  . Td 02/11/2009    Allergies  Allergen Reactions  . Other     Catfish + IV pain medication  (unknown name)-vomits    Medications: Patient's Medications  New Prescriptions   No medications on file  Previous Medications   ALPRAZOLAM (XANAX) 0.5 MG TABLET    Take 1 tablet (0.5 mg total) by mouth every 6 (six) hours as needed for anxiety.   BENAZEPRIL (LOTENSIN) 10 MG TABLET    Take 10 mg by mouth daily.   CALCIUM 600-200 MG-UNIT PER TABLET    Take 1 tablet by mouth daily.    CHLORPROMAZINE (THORAZINE) 25 MG TABLET    Take 1 tablet (25 mg total) by mouth 4 (four) times daily as needed for hiccoughs.   CHOLECALCIFEROL (D 2000) 2000 UNITS TABS    Take 1 tablet by mouth daily.    DEXAMETHASONE (DECADRON) 4 MG TABLET    Take 2 tablets (8 mg total) by mouth every 12 (twelve) hours.   FISH OIL-OMEGA-3 FATTY ACIDS 1000 MG CAPSULE    Take 1 g by mouth daily.   FOLIC ACID (FOLVITE) 1 MG TABLET    Take 1 mg by mouth daily.   HYDRALAZINE (APRESOLINE) 50 MG TABLET    Take 1 tablet (50 mg total) by mouth 3 (three) times daily.   HYDROXYCHLOROQUINE (PLAQUENIL) 200 MG TABLET    Take 200 mg by mouth 2 (two) times daily.   IPRATROPIUM-ALBUTEROL (DUONEB) 0.5-2.5 (3) MG/3ML SOLN    Take 3 mLs by nebulization every 4 (four) hours as needed.   METHIMAZOLE (TAPAZOLE) 10 MG TABLET    Take 6 tablets (60 mg total) by mouth daily.   METOPROLOL SUCCINATE (TOPROL-XL) 50 MG 24 HR TABLET    Take 1 tablet (50 mg total) by mouth daily.   MULTIPLE VITAMIN (MULTIVITAMIN) TABLET    Take 1 tablet by mouth daily.   NUTRITIONAL SUPPLEMENTS (GLUCERNA 1.5 CAL) LIQD    One can 3 times a day with meals   ONDANSETRON (ZOFRAN) 4 MG TABLET    Take 1 tablet (4 mg total) by mouth every 8 (eight) hours as needed for nausea or vomiting.   OXYCODONE (OXY IR/ROXICODONE) 5 MG IMMEDIATE RELEASE TABLET    Take 1-2 tablets (5-10 mg total) by mouth every 4 (four) hours as needed for moderate pain.   OXYCODONE (OXYCONTIN) 15 MG T12A 12 HR TABLET    Take 1 tablet (15 mg total) by mouth every 12 (twelve) hours.   PANTOPRAZOLE (PROTONIX) 40 MG  TABLET    Take 1 tablet (40 mg total) by mouth 2 (two) times daily.   POLYETHYLENE GLYCOL (MIRALAX / GLYCOLAX) PACKET    Take 17 g by mouth daily as needed for mild constipation.   SENNA (SENOKOT) 8.6 MG TABS TABLET    Take 1-2 tablets QHS PRN CONSTIPATION   TRAMADOL (ULTRAM) 50 MG TABLET    Take 1 tablet (50  mg total) by mouth every 6 (six) hours as needed for moderate pain.   VITAMIN E (VITAMIN E) 400 UNIT CAPSULE    Take 400 Units by mouth 2 (two) times daily.   Modified Medications   No medications on file  Discontinued Medications   No medications on file    Review of Systems  Constitutional: Positive for activity change, appetite change and fatigue. Negative for fever and chills.  HENT: Negative for sore throat and trouble swallowing.   Eyes: Negative for visual disturbance.  Respiratory: Positive for shortness of breath. Negative for cough and chest tightness.   Cardiovascular: Negative for chest pain, palpitations and leg swelling.  Gastrointestinal: Negative for nausea, vomiting, abdominal pain and blood in stool.  Genitourinary: Negative for urgency, frequency and difficulty urinating.  Musculoskeletal: Positive for back pain and arthralgias. Negative for gait problem.  Skin: Negative for rash.  Neurological: Positive for weakness. Negative for headaches.  Psychiatric/Behavioral: Negative for confusion and sleep disturbance. The patient is not nervous/anxious.     Filed Vitals:   07/22/14 1753  BP: 146/84  Pulse: 90  Temp: 97 F (36.1 C)  Weight: 138 lb (62.596 kg)  SpO2: 92%   Body mass index is 21.61 kg/(m^2).  Physical Exam  Constitutional: He is oriented to person, place, and time. He appears well-developed and well-nourished. No distress.  Frail appearing in NAD, lying in bed  HENT:  Mouth/Throat: Oropharynx is clear and moist.  Eyes: Pupils are equal, round, and reactive to light. No scleral icterus.  Neck: Neck supple. Carotid bruit is not present.  Thyromegaly present.  Cardiovascular: Normal rate, regular rhythm and intact distal pulses.  Exam reveals no gallop and no friction rub.   Murmur (1/6 SEM ) heard. +1 pitting LE edema b/l. No calf TTP  Pulmonary/Chest: Effort normal and breath sounds normal. He has no wheezes. He has no rales. He exhibits no tenderness.  Abdominal: Bowel sounds are normal. He exhibits distension. He exhibits no abdominal bruit, no pulsatile midline mass and no mass. There is no tenderness. There is no rebound and no guarding.  Musculoskeletal: He exhibits edema and tenderness.  Lymphadenopathy:    He has no cervical adenopathy.  Neurological: He is alert and oriented to person, place, and time.  Skin: Skin is warm and dry. No rash noted.  Psychiatric: He has a normal mood and affect. His behavior is normal. Thought content normal.     Labs reviewed: Admission on 07/04/2014, Discharged on 07/18/2014  No results displayed because visit has over 200 results.     CBC Latest Ref Rng 07/18/2014 07/17/2014 07/16/2014  WBC 4.0 - 10.5 K/uL 22.4(H) 24.1(H) 24.0(H)  Hemoglobin 13.0 - 17.0 g/dL 8.7(L) 8.7(L) 8.8(L)  Hematocrit 39.0 - 52.0 % 26.2(L) 26.5(L) 26.9(L)  Platelets 150 - 400 K/uL 117(L) 133(L) 126(L)    CMP Latest Ref Rng 07/18/2014 07/17/2014 07/16/2014  Glucose 65 - 99 mg/dL 124(H) 110(H) 121(H)  BUN 6 - 20 mg/dL 29(H) 30(H) 31(H)  Creatinine 0.61 - 1.24 mg/dL 0.36(L) 0.43(L) 0.43(L)  Sodium 135 - 145 mmol/L 138 137 139  Potassium 3.5 - 5.1 mmol/L 3.7 3.8 3.6  Chloride 101 - 111 mmol/L 107 106 108  CO2 22 - 32 mmol/L 23 23 23   Calcium 8.9 - 10.3 mg/dL 8.0(L) 7.8(L) 7.8(L)  Total Protein 6.5 - 8.1 g/dL 4.4(L) - -  Total Bilirubin 0.3 - 1.2 mg/dL 0.9 - -  Alkaline Phos 38 - 126 U/L 73 - -  AST 15 -  41 U/L 21 - -  ALT 17 - 63 U/L 36 - -     Dg Chest 2 View  07/06/2014   CLINICAL DATA:  Followup bilateral pleural effusions.  EXAM: CHEST  2 VIEW  COMPARISON:  12/11/2013 and chest CT dated 07/03/2014.   FINDINGS: Borderline enlarged cardiac silhouette. Small to moderate-sized bilateral pleural effusions with previously demonstrated associated atelectasis. 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 Left shoulder degenerative changes.  IMPRESSION: 1. Stable bilateral pleural effusions and associated atelectasis. 2. Stable changes of COPD and borderline cardiomegaly.   Electronically Signed   By: Claudie Revering M.D.   On: 07/06/2014 09:24   Ct Head Wo Contrast  07/10/2014   CLINICAL DATA:  Subdural hematoma.  EXAM: CT HEAD WITHOUT CONTRAST  TECHNIQUE: Contiguous axial images were obtained from the base of the skull through the vertex without intravenous contrast.  COMPARISON:  MRI brain 07/06/2014  FINDINGS: The subdural hematoma posteriorly about the occipital lobe and along the left lateral aspect of the falx is similar to the MRI, slightly more prominent than the previous CT. Lower density anterior left extra-axial collections are stable. Infiltrative disease extending into the subarachnoid space and adjacent to the falx is again noted along the posterior left frontal lobe near the vertex.  No acute infarct or parenchymal mass lesion is present. No acute parenchymal hemorrhage is present. The ventricles are of normal size.  The paranasal sinuses and mastoid air cells are clear. Calvarium is intact.  IMPRESSION: 1. Stable appearance of posterior and medial subdural hematoma is. 2. Stable appearance of anterior left frontal extra-axial fluid. This may represent hematoma or less likely tumor. 3. Stable appearance of infiltrative disease along the posterior medial left frontal lobe near the vertex.   Electronically Signed   By: San Morelle M.D.   On: 07/10/2014 09:38   Ct Head Wo Contrast  07/07/2014   CLINICAL DATA:  Subdural hematoma.  EXAM: CT HEAD WITHOUT CONTRAST  TECHNIQUE: Contiguous  axial images were obtained from the base of the skull through the vertex without intravenous contrast.  COMPARISON:  MRI scan of Jul 06, 2014.  FINDINGS: Bony calvarium appears intact. Mild diffuse cortical atrophy is noted. Mild chronic ischemic white matter disease is noted. Ventricular size is within normal limits. No midline shift or significant mass effect is noted. 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 There is also noted probable subdural hematoma in the left posterior fossa as described on prior exam. Also noted is left frontal subdural abnormality measuring 5.4 mm in thickness also consistent with hematoma.  IMPRESSION: Mild diffuse cortical atrophy. Mild chronic ischemic white matter disease.  Left frontal and posterior fossa subdural hematomas are noted as described on prior MRI.  High density material is noted in left parafalcine region for the vertex which may represent hemorrhage or leptomeningeal neoplastic disease as described on prior MRI.   Electronically Signed   By: Marijo Conception, M.D.   On: 07/07/2014 12:08   Ct Soft Tissue Neck W Contrast  07/03/2014   CLINICAL DATA:  Neck mass for 2 weeks. Increasing size of neck mass.  EXAM: CT NECK WITH CONTRAST  TECHNIQUE: Multidetector CT imaging of the neck was performed using the standard protocol following the bolus administration of intravenous contrast.  CONTRAST:  75 mL Isovue-300  COMPARISON:  PET-CT 08/17/2011.  Chest CT today.  FINDINGS: Pharynx and larynx: Cystic lesion is present in the body of the RIGHT mandible which may be odontogenic or represent metastatic disease. Oropharynx and nasopharynx appears patent.  Salivary glands: Parotid glands are within normal limits. Submandibular glands also appear within normal limits.  Thyroid: Massively enlarged thyroid gland with diffuse phlegmon  around the gland. There is low attenuation in the central LEFT thyroid lobe that measures about 3.3 x 2.6 cm. This probably represents metastatic disease in a patient with primary malignancy.  Lymph nodes: Innumerable necrotic lymph nodes are present in the LEFT greater than RIGHT level 2 and level 3 nodal stations extending into the chest.  Vascular: Carotid atherosclerosis. Diminutive LEFT internal jugular vein. There is a large thrombus in LEFT subclavian vein that extends to the margin of the LEFT brachiocephalic vein.  Limited intracranial: No gross acute abnormality.  Mastoids and visualized paranasal sinuses: Mastoid air cells are clear. Mucous retention cyst/ polyp in the LEFT maxillary sinus.  Skeleton: Cervical spine degenerative disease. No pathologic fractures.  Upper chest: Deferred to chest CT.  IMPRESSION: 1. Massive thyroid gland enlargement with adjacent phlegmon. LEFT-greater-than- RIGHT necrotic cervical lymphadenopathy. 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 Acute suppurative thyroiditis is in the differential considerations. 2. 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 3. Markedly carious dentition with osteolysis in the RIGHT mandibular body. This may be odontogenic or metastatic.   Electronically Signed   By: Dereck Ligas M.D.   On: 07/03/2014 16:56   Ct Chest Wo Contrast  07/03/2014   CLINICAL DATA:  F/u lung nodule pain left side with inspirationSob former smokerNo surgNo hx caOld fx'd ribs  EXAM: CT CHEST WITHOUT CONTRAST  TECHNIQUE: Multidetector CT imaging of the chest was performed following the standard protocol without IV contrast.  COMPARISON:  CT, 01/07/2014.  Chest radiograph, 12/11/2013.  FINDINGS: Thoracic inlet: There is heterogeneous soft tissue surrounding the lower trachea consistent with  heterogeneous enlargement of the thyroid gland. Margins of the gland are ill-defined. There are multiple prominent neck base lymph nodes the largest 1 cm in short axis. Partly imaged larynx shows some increased soft tissue in mild narrowing of the airway.  Mediastinum and hila: Heart is normal in size. There are mild to moderate coronary artery calcifications. There is dilation of the ascending aorta measuring 4.6 cm x 4.5 cm in diameter. The aorta is mildly dilated along the arch and proximal descending portion. There are diffuse atherosclerotic calcifications throughout the thoracic aorta.  The multiple small mediastinal lymph nodes. There are no pathologically enlarged lymph nodes. No mediastinal masses. No hilar masses or adenopathy.  Lungs and pleura: There are small, right greater than left, bilateral pleural effusions. There are coarse reticular opacities in the right upper lobe with intervening emphysematous cystic spaces and some intervening hazy ground-glass type opacity. 4 mm noncalcified pleural-based nodule noted in the right upper lobe, image 30, series 4. Calcified granuloma is noted in the right lower lobe peripherally. There is mild atelectasis adjacent to the pleural effusions in both lower lobes there is no pulmonary edema. No pneumothorax. Mild emphysema is noted in the left upper lobe.  Limited upper abdomen: Dense upper abdominal aortic calcification and partly imaged proximal aspect of an aortic stent. Visualized portions of the liver, spleen, gallbladder and pancreas are  unremarkable. No adrenal masses.  Musculoskeletal: There are lytic process cysts involving 3 discrete ribs, of the right posterior fourth rib, right posterior and lateral seventh rib and the left anterior and lateral fifth rib. The soft tissue mass surrounds the lytic process of the left antral lateral fifth rib. No other lytic lesions.  IMPRESSION: 1. There are coarse reticular opacities with intervening hazy ground-glass  opacity and associated if edematous cystic change in the right upper lobe. This could all be chronic. Acute infectious or inflammatory infiltrate is possible. 2. No discrete pulmonary mass. 4 mm pleural-based nodule noted in the peripheral right upper lobe. No other suspicious nodules 3. Small, right greater than left, pleural effusions with associated dependent atelectasis. No pulmonary edema. 4. Neck base abnormalities. Patient also underwent neck CT. There is ill-defined soft tissue that involves and surrounds the thyroid gland. The thyroid appears enlarged and heterogeneous. There are multiple prominent neck base lymph nodes. 5. There are lytic lesions involving 3 discrete ribs. The left fifth rib also has associated soft tissue mass. This is highly suspicious for metastatic disease to bone.   Electronically Signed   By: Lajean Manes M.D.   On: 07/03/2014 16:39   Mr Jeri Cos MW Contrast  07/18/2014   CLINICAL DATA:  Metastatic lung cancer.  EXAM: MRI HEAD WITHOUT AND WITH CONTRAST  TECHNIQUE: Multiplanar, multiecho pulse sequences of the brain and surrounding structures were obtained without and with intravenous contrast.  CONTRAST:  62m MULTIHANCE GADOBENATE DIMEGLUMINE 529 MG/ML IV SOLN  COMPARISON:  Head CT 07/10/2014 and MRI 07/06/2014  FINDINGS: Prominent susceptibility artifact associated with vascular structures throughout the brain may be secondary to IV medication administration such as iron therapy. This also results in intravascular T1 shortening on precontrast images as well. There is no evidence of acute infarct or midline shift. There is moderate cerebral atrophy.  There is small volume subdural hemorrhage overlying the left cerebral hemisphere, greatest in the parieto-occipital region and overlying the left frontal lobe. The overall volume of supratentorial subdural hemorrhage is similar to the prior MRI, although there has been some redistribution. Subdural collection over the left frontal  lobe is similar in size to the prior MRI, measuring 6-7 mm in thickness and again with signal characteristics different than the hematoma more posteriorly in the left cerebral hemisphere, possibly reflecting blood of different ages. A small amount of subdural hematoma is also present along the left falx. Subdural hematoma in the posterior fossa has significantly decreased in size.  Abnormal FLAIR signal is again seen within multiple left frontoparietal sulci near the vertex. No definite leptomeningeal enhancement is identified in this region on the current study, with hyperintense material at the left vertex on postcontrast images demonstrating intrinsic T1 hyperintensity on precontrast images. Some pachymeningeal enhancement/ thickening is likely present in this area. Focal T2 hypointense material in a left parafalcine location at the vertex is stable to slightly smaller than on the prior MRI and may reflect blood products, again with mild mass effect on the superior sagittal sinus. No definite parenchymal brain edema is seen in this region.  Mild periventricular white matter chronic small vessel ischemic disease is noted. No definite osseous skull metastases are identified. Mild dural thickening is again noted along the left tentorium.  Orbits are unremarkable. Paranasal sinuses and mastoid air cells are clear. Major intracranial vascular flow voids are preserved.  IMPRESSION: 1. Small volume left-sided supratentorial subdural hemorrhage, overall similar in volume to the prior MRI with some interval redistribution. Posterior  fossa subdural hematoma has significantly decreased in size. 2. Persistent abnormal subdural and subarachnoid material at the left vertex, again without associated brain edema or definite enhancing mass on the current examination. This appears to largely represent hematoma, however an underlying metastasis, such as a dural metastatic deposit, remains a consideration. Short-term follow-up MRI  may be helpful once the subdural hemorrhage has resolved to assess for an underlying lesion. 3. No evidence of parenchymal brain metastases.   Electronically Signed   By: Logan Bores   On: 07/18/2014 12:51   Mr Jeri Cos Wo Contrast  07/06/2014   CLINICAL DATA:  74 year old male with metastatic disease detected on recent neck, chest CT and lumbar MRI. 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 Biopsy pending. Staging. Subsequent encounter.  EXAM: MRI HEAD WITHOUT AND WITH CONTRAST  TECHNIQUE: Multiplanar, multiecho pulse sequences of the brain and surrounding structures were obtained without and with intravenous contrast.  CONTRAST:  71m MULTIHANCE GADOBENATE DIMEGLUMINE 529 MG/ML IV SOLN  COMPARISON:  Neck CT 07/03/2014.  FINDINGS: Overlying the left superior frontal gyrus there is a 30 x 37 x 8 mm area of abnormal signal and enhancement involving the dura; pachy- and leptomeninges, and also the calvarium. 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 See series 7, image 23, post-contrast series 11, image 52 series 13, image 14 and series 12, image 12. Still, there is no associated cortical or cerebral edema, in the adjacent bone marrow signal appears normal, without an infiltrative osseous lesion in this area. 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  There is a superimposed left posterior fossa subdural hematoma measuring up to 11 mm in thickness which appears to be new since 07/03/2014. There is a hematocrit level associated. See series 6, image 7 and series 7, image 7. The hematoma wraps around the left cerebellar hemisphere, but there is no posterior fossa midline shift or basilar cistern effacement. There is also a left supratentorial subdural hematoma measuring up to 5-6 mm in thickness series 7, image 15 and series 10, image 11). Associated susceptibility  artifact including on trace diffusion imaging.  Trace supratentorial rightward midline shift. No ventricular effacement. No other significant intracranial mass effect.  There is smooth thickening and hyperenhancement of the left tentorium (series 12, image 10) without associated leptomeningeal enhancement, likely reactive due to the subdural blood.  No other abnormal intracranial enhancement identified. No parenchymal tumor or metastasis identified.  Grossly negative visualized cervical spine and cervical spinal cord.  No restricted diffusion or evidence of acute infarction. No intraventricular hemorrhage or ventriculomegaly. Major intracranial vascular flow voids are preserved, including the superior sagittal sinus which is marginally affected by the left superior frontal lesion described in the first paragrah. Normal for age gray and white matter signal. Negative pituitary and cervicomedullary junction.  Visible internal auditory structures appear normal. Mastoids are clear. Trace paranasal sinus mucosal thickening. Visualized orbit soft tissues are within normal limits. Visualized scalp soft tissues are within normal limits. Bone marrow signal aside from that described at the left vertex is within normal limits.  IMPRESSION: 1. Left side subdural hematomas in both the posterior fossa and along the left hemisphere, appear to be new since the neck CT on 07/03/2014. 2. Abnormal appearance of a 3-4 cm area of the pachy- and leptomeninges at the left vertex over the left superior frontal gyrus. 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 In the absence of a known malignancy, the differential diagnosis of this dural abnormality would include a dural vascular malformation. 3. No cerebral or cerebellar edema. No significant intracranial  mass effect at this time. 4. No other putative metastatic disease identified about the brain; smooth left tentorial dural thickening is likely reactive due to the hemorrhage. Study discussed by telephone with Dr. Cline Cools on 07/06/2014 at 1443 hours.   Electronically Signed   By: Genevie Ann M.D.   On: 07/06/2014 14:55   Mr Lumbar Spine Wo Contrast  07/04/2014   CLINICAL DATA:  Severe low back pain radiating into the right hip and leg. Symptoms for 1 year.  EXAM: MRI LUMBAR SPINE WITHOUT CONTRAST  TECHNIQUE: Multiplanar, multisequence MR imaging of the lumbar spine was performed. No intravenous contrast was administered.  COMPARISON:  CT abdomen and pelvis 01/07/2014. PET CT scan 08/17/2011.  FINDINGS: Multiple foci of abnormal marrow signal are identified post consistent with metastatic disease or less likely multiple myeloma. L4 is completely replaced by tumor. A large lesion is also identified and L5 eccentric to the right extending into the right pedicle. Multiple additional lesions are seen including the posterior right and left ilium and sacrum. Paraspinous soft tissue structures demonstrate abnormal signal to the right of the L3 vertebral body extending into the psoas musculature and posterior paraspinous musculature inferiorly to the level of L4-5. Epidural tumor is seen from L3 to L5 with areas of extension out of both the vertebral body and posterior elements. The conus medullaris is normal in signal and position. Imaged intra-abdominal contents show partial visualization of abdominal aortic aneurysm which is been repaired.  The T11-12 level is imaged in the sagittal plane only and negative.  T12-L1: Small central protrusion is identified without central canal or foraminal narrowing.  L1-2:  Negative.  L2-3: Shallow disc bulge to the left without central canal or foraminal narrowing.  L3-4: Epidural tumor on the right extends into the right foramen and right paravertebral space and encroaches on the right  L3 root and descending right L4 root. The left foramen appears open. There is also disc bulge this level.  L4-5: Epidural tumor extends out of the vertebral body narrowing the central canal with bulky tumor extension into the right paravertebral space and foramen encroaching on the exiting right L4 root.  L5-S1: Large epidural tumor deposits are seen extending out of the right lamina of L5 causing marked narrowing of the central canal and right lateral recess with encroachment on the descending and exiting right L5 root. There is a shallow disc bulge at this level.  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 Most notably affected levels are from L3 to L5-S1 level.  Findings were discussed with Dr. Carmin Muskrat at the time of interpretation.   Electronically Signed   By: Inge Rise M.D.   On: 07/04/2014 10:02   Mr Thoracic Spine W Wo Contrast  07/16/2014   CLINICAL DATA:  Metastatic disease unknown primary.  Lung nodule.  EXAM: MRI THORACIC SPINE WITHOUT AND WITH CONTRAST  TECHNIQUE: Multiplanar and multiecho pulse sequences of the thoracic spine were obtained without and with intravenous contrast.  CONTRAST:  97m MULTIHANCE GADOBENATE DIMEGLUMINE  529 MG/ML IV SOLN  COMPARISON:  CT chest 07/03/2014  FINDINGS: Image quality degraded by motion.  Skeletal metastatic disease is present in multiple vertebral bodies and multiple ribs. Moderate pleural effusion bilaterally, possibly malignant given the multiple rib lesions.  There is tumor in the T3 vertebral body extending into the pedicle and posterior elements. There is mild epidural tumor on the right in the foramen and canal without cord compression. Tumor is present in the T4 vertebral body with probable mild epidural tumor on the right. There is tumor  in the T5 vertebral body without definite epidural tumor. There is tumor in the T11 vertebral body without epidural tumor. I  Negative for pathologic fracture.  No cord compression.  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 There is epidural tumor on the right at the T3 and T4 levels with mild spinal stenosis but no cord compression. Spinal cord signal remains normal  Bilateral pleural effusions could be malignant.   Electronically Signed   By: Franchot Gallo M.D.   On: 07/16/2014 12:12   Mr Lumbar Spine W Wo Contrast  07/14/2014   CLINICAL DATA:  74 year old male with history of osseous metastatic disease. Primary tumor not specified (possibly thyroid close. Worsening back pain. Subsequent encounter.  EXAM: MRI LUMBAR SPINE WITHOUT AND WITH CONTRAST  TECHNIQUE: Multiplanar and multiecho pulse sequences of the lumbar spine were obtained without and with intravenous contrast.  CONTRAST:  93m MULTIHANCE GADOBENATE DIMEGLUMINE 529 MG/ML IV SOLN  COMPARISON:  07/04/2014 MR.  FINDINGS: Last fully open disk space is labeled L5-S1. Present examination incorporates from upper T12 through lower sacrum.  New from the prior examination is epidural hematoma dorsal aspect of the canal extending from mid L3 to the T12 level with maximal thickness at the L2 and L3 level measuring up to 6.5 mm AP dimension. This contributes to thecal sac narrowing with anterior displacement of the contained nerve roots most notable L2 level. The superior extent of this epidural hematoma was not imaged on the present exam. Thoracic/cervical spine MR can be obtained for further delineation if clinically desired.  Diffuse osseous metastatic disease involving all visualized osseous structures. This is most notable involving the L4 and L5 vertebral body with progressive osseous metastatic disease as best appreciated posterior aspect of the L3 vertebral body. Epidural extension of tumor greatest  at the L3-4 through L5 pedicle level with extension into the right L3-4 and L4-5 and left L4-5 neural foramen. Extension of tumor into the psoas muscles and paraspinal small slit greater on the right. Degenerative changes combined with epidural tumor causes L3-4 multifactorial marked thecal sac narrowing, L4-5 severe thecal sac narrowing and L5-S1 mild to moderate thecal sac narrowing.  The patient has elevated white count. The epidural disease is more suggestive of tumor and hemorrhage rather than infection (which cannot be entirely excluded).  Abdominal aortic aneurysm treated with endovascular stenting. Dilated left common iliac artery measuring up to 2.1 cm.  IMPRESSION: New epidural hematoma dorsal aspect of the canal extending from mid L3 to the T12 level with maximal thickness at the L2 and L3 level measuring up to 6.5 mm AP dimension. This contributes to thecal sac narrowing with anterior displacement of the contained nerve roots most notable L2 level. The superior extent of this epidural hematoma was not imaged on the present exam. Thoracic/cervical spine MR can be obtained for further delineation if clinically desired.  Diffuse osseous metastatic disease involving all visualized osseous structures. This is  most notable involving the L4 and L5 vertebral body with progressive osseous metastatic disease as best appreciated posterior aspect of the L3 vertebral body. Epidural extension of tumor greatest at the L3-4 through L5 pedicle level with extension into the right L3-4 and L4-5 and left L4-5 neural foramen. Extension of tumor into the psoas muscles and paraspinal small slit greater on the right. Degenerative changes combined with epidural tumor causes L3-4 multifactorial marked thecal sac narrowing, L4-5 severe thecal sac narrowing and L5-S1 mild to moderate thecal sac narrowing.  Please see above discussion.  These results were called by telephone at the time of interpretation on 07/14/2014 at 2:34 pm to  Dr. Mart Piggs , who verbally acknowledged these results.   Electronically Signed   By: Genia Del M.D.   On: 07/14/2014 14:39   Nm Bone Scan Whole Body  07/08/2014   CLINICAL DATA:  74 year old male with bony metastatic disease.  EXAM: NUCLEAR MEDICINE WHOLE BODY BONE SCAN  TECHNIQUE: Whole body anterior and posterior images were obtained approximately 3 hours after intravenous injection of radiopharmaceutical.  RADIOPHARMACEUTICALS:  25.1 Technetium-42mMDP IV  COMPARISON:  Recent imaging.  FINDINGS: Scattered areas of abnormal increased activity within multiple bilateral ribs are identified compatible with bony lesions/ metastatic disease on recent chest CT. The most pronounced areas include the posterior right seventh rib in the anterior left fifth rib.  No suspicious bony abnormalities are identified.  Degenerative changes within the shoulders and knees bilaterally are noted.  A focus of increased activity within the lateral soft tissues of the upper left arm is indeterminate.  IMPRESSION: Abnormal increased bilateral rib activity compatible with metastatic disease. No other suspicious bony abnormalities identified.  Focus of increased activity within the lateral soft tissues of the upper left arm -indeterminate. Correlate clinically.  Degenerative changes as described.   Electronically Signed   By: JMargarette CanadaM.D.   On: 07/08/2014 13:11   [place]oreaSoft Tissue Head/neck  07/06/2014   CLINICAL DATA:  Hyperthyroidism  EXAM: THYROID ULTRASOUND  TECHNIQUE: Ultrasound examination of the thyroid gland and adjacent soft tissues was performed.  COMPARISON:  None ; correlation CT soft tissue neck 07/03/2014  FINDINGS: Right thyroid lobe  Measurements: 6.2 x 4.3 x 2.5 cm. Tiny calcification 2 mm diameter within RIGHT lobe. Diffusely inhomogeneous echogenicity. No discrete mass.  Left thyroid lobe  Measurements: 5.4 x 2.6 x 3.2 cm. Heterogeneous echogenicity without discrete mass.  Isthmus  Thickness: 15 mm thick,  thickened. Inhomogeneous echogenicity without focal mass.  Lymphadenopathy  Numerous lymph nodes identified in the cervical regions bilaterally up to 10 mm short axis diameters.  IMPRESSION: Enlarged inhomogeneous thyroid gland with a single nonspecific calcification in the RIGHT lobe.  No discrete thyroid mass identified.  Scattered cervical lymph nodes normal to mildly enlarged in sizes.  Patient received IV contrast material on 07/03/2014 and again on 07/05/2014; the iodine load within these contrast administrations will prevent performance of an accurate radionuclide thyroid uptake and scan as well as potential radioactive iodine therapy for hyperthyroidism for 6-8 weeks.   Electronically Signed   By: MLavonia DanaM.D.   On: 07/06/2014 13:30   Ct Abdomen Pelvis W Contrast  07/05/2014   CLINICAL DATA:  Continued staging of newly diagnosed bone lesions. WBC=16.3*  EXAM: CT ABDOMEN AND PELVIS WITH CONTRAST  TECHNIQUE: Multidetector CT imaging of the abdomen and pelvis was performed using the standard protocol following bolus administration of intravenous contrast.  CONTRAST:  566mOMNIPAQUE IOHEXOL 300 MG/ML SOLN,  116m OMNIPAQUE IOHEXOL 300 MG/ML SOLN  COMPARISON:  01/07/2014  FINDINGS: Moderate right small left pleural effusions. There is significant left right lower lobe atelectasis. No pulmonary edema. Heart is normal in size. No lung base mass or discrete nodule.  Liver, spleen, gallbladder, pancreas, adrenal glands:  Unremarkable.  Small low-density renal lesions, most likely cysts. Relative area of decreased enhancement in the inferior medial aspect the left kidney, which is stable from the prior CT. Small cysts have increased in size since the prior study. No other renal abnormality. No hydronephrosis. Normal ureters. Bladder is unremarkable.  Excluded infrarenal abdominal aortic aneurysm. Native aneurysm measures 6.2 cm x 4.9 cm transversely. 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 These findings are relatively stable.  No pathologically enlarged lymph nodes.  No ascites.  Colon and small bowel are unremarkable. Appendix not convincingly seen.  Degenerative changes noted of the lumbar spine. There is a mottled appearance of vertebrae, but no discrete osteoblastic or osteolytic lesion. The widespread lesions evident on MRI are not well resolved high CT.  IMPRESSION: 1. No acute finding within the abdomen pelvis. 2. No extraosseous metastatic disease or evidence of a primary neoplasm. 3. 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 The visible vertebrae show a subtle heterogeneous pattern without a discrete osteoblastic or osteolytic lesion. 4. Moderate right and small to moderate left pleural effusions with significant associated lower lobe atelectasis. No lung base nodules.   Electronically Signed   By: DLajean ManesM.D.   On: 07/05/2014 09:39   Dg Esophagus  07/07/2014   CLINICAL DATA:  Severe dysphagia. Metastatic disease of unknown primary.  EXAM: ESOPHOGRAM/BARIUM SWALLOW  TECHNIQUE: Single contrast examination was performed using  thin barium.  FLUOROSCOPY TIME:  Fluoroscopy Time:  2 minutes 36 seconds  Number of Acquired Images:  7  COMPARISON:  None.  FINDINGS: A small amount of aspiration of barium was immediately seen, which elicited a cough reflex. This limited ability to continue with the exam. Small amount of administered barium shows no evidence of esophageal mass or stricture, although esophagus could not be completely distended. No hiatal hernia visualized.  IMPRESSION: Suboptimal exam due to immediate aspiration, which elicited a cough reflex. No esophageal mass or stricture identified.   Electronically Signed   By: JEarle GellM.D.   On: 07/07/2014 16:35   [place]oreaBiopsy  07/11/2014   INDICATION: Concern for metastatic lung cancer. Please perform repeat ultrasound-guided cervical lymph node biopsy for additional tissue for  molecular testing.  EXAM: ULTRASOUND-GUIDED CERVICAL LYMPH NODE BIOPSY  COMPARISON:  Chest CT - 07/03/2014; thyroid ultrasound - 07/06/2014; ultrasound-guided left cervical lymph node biopsy - 07/07/2014  MEDICATIONS: None  ANESTHESIA/SEDATION: None  COMPLICATIONS: None immediate  TECHNIQUE: Informed written consent was obtained from the patient after a discussion of the risks, benefits and alternatives to treatment. Questions regarding the procedure were encouraged and answered. Initial ultrasound scanning demonstrated multiple shotty enlarged and borderline enlarged cervical lymph nodes bilaterally. As patient had undergone prior left-sided cervical lymph node biopsy, the decision was made to target the dominant right-sided nodal conglomeration in the hopes of obtaining additional tissue for molecular testing. An ultrasound image was saved for documentation purposes. The procedure was planned. A timeout was performed prior to the initiation of the procedure.  The operative was prepped and draped in the usual sterile fashion, and a sterile drape was applied covering the operative field. A timeout was performed  prior to the initiation of the procedure. Local anesthesia was provided with 1% lidocaine with epinephrine.  Under direct ultrasound guidance, an 18 gauge core needle device was utilized to obtain to obtain 7 core needle biopsied of the dominant nodal conglomeration within the mid / inferior aspect of the right neck. Multiple ultrasound images were saved for documentation purposes.  The samples were placed in saline and submitted to pathology. The needle was removed and hemostasis was achieved with manual compression. Post procedure scan was negative for significant hematoma. A dressing was placed. The patient tolerated the procedure well without immediate postprocedural complication.  IMPRESSION: Technically successful ultrasound guided right cervical lymph node biopsy for additional tissue for molecular  testing.   Electronically Signed   By: Sandi Mariscal M.D.   On: 07/11/2014 11:28   US Biopsy  07/07/2014   CLINICAL DATA:  74 year old male with a history of lymphadenopathy, concern for thyroid cancer metastasis  EXAM: ULTRASOUND GUIDED CORE BIOPSY OF LEFT CERVICAL LYMPH NODES  MEDICATIONS: 1.0 mg IV Versed; 0 mcg IV Fentanyl  Total Moderate Sedation Time: 0  PROCEDURE: The procedure, risks, benefits, and alternatives were explained to the patient. Questions regarding the procedure were encouraged and answered. The patient understands and consents to the procedure.  Ultrasound survey of the left neck was performed with images stored and sent to PACs.  The left neck was prepped with Betadine in a sterile fashion, and a sterile drape was applied covering the operative field. A sterile gown and sterile gloves were used for the procedure. Local anesthesia was provided with 1% Lidocaine.  Once the patient was prepped and draped sterilely, the skin and subcutaneous tissues were generously infiltrated with 1% lidocaine without epinephrine. Ultrasound guidance was used to infiltrate the skin and subcutaneous tissues to the level of the targeted nodes.  A small skin incision was made with 11 blade scalpel, and then and 18 gauge core biopsy gun was used to retrieve 5 separate 18 gauge core biopsy. Each of these was observed under ultrasound.  Tissue specimen sent to pathology in saline.  The patient tolerated the procedure well and remained hemodynamically stable throughout.  No complications were encountered and no significant blood loss was encountered.  Sterile bandage was placed.  EBL = 0  COMPLICATIONS: None.  FINDINGS: Ultrasound survey demonstrates multiple abnormal nodes along the left cervical chain.  Images during the case demonstrate needle placement within the nodes on each pass.  Final image demonstrates no complicating features.  IMPRESSION: Status post ultrasound-guided biopsy of left cervical lymph nodes.  Specimen sent to pathology for complete histopathologic analysis.  Signed,  Dulcy Fanny. Earleen Newport, DO  [facility]  [facility]   Electronically Signed   By: Corrie Mckusick D.O.   On: 07/07/2014 13:43   Dg Chest Port 1 View  07/16/2014   CLINICAL DATA:  Cough.  Metastatic non-small cell carcinoma.  EXAM: PORTABLE CHEST - 1 VIEW  COMPARISON:  07/09/2014  FINDINGS: PICC line is in stable position within the SVC. Lungs show stable appearance of bilateral small pleural effusions with associated bilateral lower lobe atelectasis. Mild interstitial prominence remains in both lungs without evidence of overt airspace edema. The heart size is normal. No pneumothorax.  IMPRESSION: Stable bilateral pleural effusions with associated bilateral lower lobe atelectasis.   Electronically Signed   By: Aletta Edouard M.D.   On: 07/16/2014 08:32   Dg Chest Port 1 View  07/09/2014   CLINICAL DATA:  PICC placement  EXAM: PORTABLE CHEST - 1 VIEW  COMPARISON:  07/07/2014  FINDINGS: Right arm PICC tip in the mid SVC in good position.  Right upper lobe airspace disease unchanged. Bibasilar airspace disease and bilateral effusions unchanged.  IMPRESSION: Right arm PICC tip in the SVC  Right upper lobe airspace disease may represent edema or pneumonia. Bibasilar atelectasis and effusion unchanged.   Electronically Signed   By: Franchot Gallo M.D.   On: 07/09/2014 09:13   Dg Chest Port 1 View  07/07/2014   CLINICAL DATA:  Aspiration.  Hypoxia.  Evaluate for pneumonia.  EXAM: PORTABLE CHEST - 1 VIEW  COMPARISON:  07/06/2014  FINDINGS: Normal heart size. There is calcified atherosclerosis involving the thoracic aorta. Bilateral pleural effusions are identified. There is mild diffuse interstitial edema. Airspace consolidation with air bronchograms are noted in the left lower lobe.  IMPRESSION: 1. Left lower lobe airspace consolidation. 2. Bilateral pleural effusions and interstitial edema consistent  with pneumonia.   Electronically Signed   By: Kerby Moors M.D.   On: 07/07/2014 21:07   Dg Swallowing Func-speech Pathology  07/08/2014    Objective Swallowing Evaluation:    Patient Details  Name: ALIF PETRAK MRN: [id number] Date of Birth: [birth year]  Today's Date: 07/08/2014 Time: SLP Start Time (ACUTE ONLY): 1235-SLP Stop Time (ACUTE ONLY): 1305 SLP Time Calculation (min) (ACUTE ONLY): 30 min  Past Medical History:  Past Medical History  Diagnosis Date  . Lung nodule 2012  . Insomnia     takes Restoril nightly as needed  . Hypertension     takes Benazepril and Metoprolol daily  . Pneumonia     hx of;many yrs ago  . AAA (abdominal aortic aneurysm)   . Joint pain   . Joint swelling   . Urinary frequency     takes Terazosin daily  . Urinary urgency   . Nocturia   . Enlarged prostate     slightly  . Rheumatoid arthritis(714.0)     takes Plaquenil daily and Methotrexate weekly   Past Surgical History:  Past Surgical History  Procedure Laterality Date  . Inguinal hernia repair  2008  . Appendectomy    . Abdominal aortic endovascular stent graft Bilateral 12/11/2013    Procedure: ABDOMINAL AORTIC ENDOVASCULAR STENT GRAFT; left groin  exploration;  Surgeon: Rosetta Posner, MD;  Location: [facility] OR;  Service:  Vascular;  Laterality: Bilateral;   HPI:  Other Pertinent Information: 74 year old male with a known diagnosis of  COPD who came to the hospital because of some weight loss, shortness of  breath, and back pain. He was found to have CT imaging findings suggestive  of widespread metastasis from an uncertain primary malignancy.  Pt was  admitted for further workup with oncology and ENT per MD notes.  Per CT  neck, pt with massive enlargement of thyroid lesion (left lobe) with  phlegmon/inflammation 07/03/14.   Difficulty swallowing food more than drink  reported by pt -progressing within the last few weeks.  Pt states swelling  in neck present x1 month but progressed in 2 weeks.  Esophagram completed  with pt having  aspiration - esophagus negative.  Pt now with pna per CXR.   MD reordered SLP swallow evaluation due to pt coughing with intake.    No Data Recorded  Assessment / Plan / Recommendation CH[facility] IP CLINICAL IMPRESSIONS 07/08/2014  Therapy Diagnosis Severe cervical esophageal phase dysphagia  Clinical Impression Pt has a severe cervical esophageal phase dysphagia  presumed to be due  obstructive to left thyroid mass/lymph node  involvement.  Gross pharyngeal stasis noted with minimal barium transiting  into esophagus and aspiration.  Multtiple strategies attempted to aid  clearance including head turn, chin tuck, following solids with liquids,  etc.  Fortunately pt was able to expectorate barium mixed with secretions  throughout the study (expectorating approximately 90% of boluses).  He did  aspirate a small amount of liquids without protective cough response.  Pt  is at very high malnutrition and aspiration risk due to his level of  dysphagia.  ? if pt would benefit from consideration for alternative means  of nutrition for nutriional support as he goes through treatment.  Using  live video, SLP educated pt to findings and concerns for aspiraton and  nutrition.  Recommend consider NPO except ice and water to decrease disuse  muscle atrophy.   Hopeful for improvement in swallowing with treatment.        CHL IP TREATMENT RECOMMENDATION 07/08/2014  Treatment Recommendations Therapy as outlined in treatment plan below     CHL IP DIET RECOMMENDATION 07/08/2014  SLP Diet Recommendations NPO except water/ice to prevent disuse muscle  atrophy  Liquid Administration via (None)  Medication Administration Via alternative means  Compensations (None)  Postural Changes and/or Swallow Maneuvers (None)     CHL IP OTHER RECOMMENDATIONS 07/08/2014  Recommended Consults (None)  Oral Care Recommendations Oral care BID  Other Recommendations (None)     No flowsheet data found.   CHL IP FREQUENCY AND DURATION 07/08/2014  Speech Therapy Frequency  (ACUTE ONLY) (None)  Treatment Duration 1 week     Pertinent Vitals/Pain     SLP Swallow Goals No flowsheet data found.  No flowsheet data found.    CHL IP REASON FOR REFERRAL 07/08/2014  Reason for Referral Objectively evaluate swallowing function     CHL IP ORAL PHASE 07/08/2014  Lips (None)  Tongue (None)  Mucous membranes (None)  Nutritional status (None)  Other (None)  Oxygen therapy (None)  Oral Phase Impaired  Oral - Pudding Teaspoon (None)  Oral - Pudding Cup (None)  Oral - Honey Teaspoon (None)  Oral - Honey Cup (None)  Oral - Honey Syringe (None)  Oral - Nectar Teaspoon (None)  Oral - Nectar Cup (None)  Oral - Nectar Straw (None)  Oral - Nectar Syringe (None)  Oral - Ice Chips (None)  Oral - Thin Teaspoon (None)  Oral - Thin Cup (None)  Oral - Thin Straw (None)  Oral - Thin Syringe (None)  Oral - Puree (None)  Oral - Mechanical Soft (None)  Oral - Regular (None)  Oral - Multi-consistency (None)  Oral - Pill (None)  Oral Phase - Comment (None)      CHL IP PHARYNGEAL PHASE 07/08/2014  Pharyngeal Phase Impaired  Pharyngeal - Pudding Teaspoon (None)  Penetration/Aspiration details (pudding teaspoon) (None)  Pharyngeal - Pudding Cup (None)  Penetration/Aspiration details (pudding cup) (None)  Pharyngeal - Honey Teaspoon (None)  Penetration/Aspiration details (honey teaspoon) (None)  Pharyngeal - Honey Cup (None)  Penetration/Aspiration details (honey cup) (None)  Pharyngeal - Honey Syringe (None)  Penetration/Aspiration details (honey syringe) (None)  Pharyngeal - Nectar Teaspoon (None)  Penetration/Aspiration details (nectar teaspoon) (None)  Pharyngeal - Nectar Cup (None)  Penetration/Aspiration details (nectar cup) (None)  Pharyngeal - Nectar Straw (None)  Penetration/Aspiration details (nectar straw) (None)  Pharyngeal - Nectar Syringe (None)  Penetration/Aspiration details (nectar syringe) (None)  Pharyngeal - Ice Chips (None)  Penetration/Aspiration details (ice chips) (None)  Pharyngeal - Thin  Teaspoon  (None)  Penetration/Aspiration details (thin teaspoon) (None)  Pharyngeal - Thin Cup (None)  Penetration/Aspiration details (thin cup) (None)  Pharyngeal - Thin Straw (None)  Penetration/Aspiration details (thin straw) (None)  Pharyngeal - Thin Syringe (None)  Penetration/Aspiration details (thin syringe') (None)  Pharyngeal - Puree (None)  Penetration/Aspiration details (puree) (None)  Pharyngeal - Mechanical Soft (None)  Penetration/Aspiration details (mechanical soft) (None)  Pharyngeal - Regular (None)  Penetration/Aspiration details (regular) (None)  Pharyngeal - Multi-consistency (None)  Penetration/Aspiration details (multi-consistency) (None)  Pharyngeal - Pill (None)  Penetration/Aspiration details (pill) (None)  Pharyngeal Comment pt able to expectorate residuals with cues - multiple  compensation strategies attempted to aid clearance into esophagus without  avail      CHL IP CERVICAL ESOPHAGEAL PHASE 07/08/2014  Cervical Esophageal Phase Impaired  Pudding Teaspoon (None)  Pudding Cup (None)  Honey Teaspoon (None)  Honey Cup (None)  Honey Straw (None)  Nectar Teaspoon (None)  Nectar Cup (None)  Nectar Straw (None)  Nectar Sippy Cup (None)  Thin Teaspoon (None)  Thin Cup (None)  Thin Straw (None)  Thin Sippy Cup (None)  Cervical Esophageal Comment very poor clearance into esophagus due to  mechanical obstruction from thyroid mass    No flowsheet data found.         Luanna Salk, [place] [facility] SLP [phone]      Assessment/Plan   ICD-9-CM ICD-10-CM   1. Intractable vomiting with nausea, unspecified vomiting type 536.2 R11.2   2. Constipation, unspecified constipation type 564.00 K59.00   3. Metastatic cancer ([place]) 199.1 C80.1    adenoCA; primary unknown  4. 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 SOB (shortness of breath) on [place]  O2 786.05 R06.02   6. Anemia in neoplastic disease - stable 285.22 D63.0   7. Essential hypertension - stable 401.9 I10   8. Low back  pain, unspecified back pain laterality, with sciatica presence unspecified  724.2 M54.5      --check KUB to r/o obstruction  --Phenergan 17m IM q6hrs prn severe N/V  --start colace 2035mdaily for constipation  --check CBC w diff, CMP, TSH, free T4, free T3, and CXR PA and lateral in 1 week  --cont other meds as ordered  --cont nutritional supplement as ordered  --f/u with specialists as scheduled  --cont [place] O2 as ordered   --PT as ordered  --GOAL: overall prognosis is poor. Palliative care to follow. Communicated with pt and nursing.  --will follow  Pharrah Rottman S. CaPerlie GoldPiJesse Brown [facility] [address] [phone] (Monday-Friday 8 AM - 5 PM) ([phone] 5 PM and follow prompts

## 2014-07-23 ENCOUNTER — Encounter: Payer: Self-pay | Admitting: Radiation Oncology

## 2014-07-23 ENCOUNTER — Ambulatory Visit
Admit: 2014-07-23 | Discharge: 2014-07-23 | Disposition: A | Discharge status: Home / Self Care | Payer: BC Managed Care – PPO | Attending: Radiation Oncology | Admitting: Radiation Oncology

## 2014-07-23 DIAGNOSIS — Z510 Encounter for antineoplastic radiation therapy: Secondary | ICD-10-CM | POA: Diagnosis not present

## 2014-07-25 ENCOUNTER — Other Ambulatory Visit: Payer: Self-pay | Admitting: Hematology & Oncology

## 2014-07-25 ENCOUNTER — Telehealth: Payer: Self-pay | Admitting: Radiation Oncology

## 2014-07-25 ENCOUNTER — Telehealth: Payer: Self-pay | Admitting: Hematology & Oncology

## 2014-07-25 NOTE — Telephone Encounter (Signed)
[place] was called and given patient's new patient appt 07/31/14 date/time and our location.

## 2014-07-25 NOTE — Telephone Encounter (Signed)
Son calling to confirm that we referred his father over to oncology. I confirmed that he is to see Dr. Marin Olp on 07/31/14 and needs to arrive at 8:15 for appointments before seeing Dr. Marin Olp. He understood.

## 2014-07-28 ENCOUNTER — Emergency Department (HOSPITAL_COMMUNITY): Payer: BC Managed Care – PPO

## 2014-07-28 ENCOUNTER — Inpatient Hospital Stay (HOSPITAL_COMMUNITY)
Admission: EM | Admit: 2014-07-28 | Discharge: 2014-08-29 | DRG: 208 | Disposition: E | Payer: BC Managed Care – PPO | Attending: Pulmonary Disease | Admitting: Pulmonary Disease

## 2014-07-28 ENCOUNTER — Inpatient Hospital Stay (HOSPITAL_COMMUNITY): Payer: BC Managed Care – PPO

## 2014-07-28 ENCOUNTER — Encounter (HOSPITAL_COMMUNITY): Payer: Self-pay | Admitting: Emergency Medicine

## 2014-07-28 DIAGNOSIS — Z515 Encounter for palliative care: Secondary | ICD-10-CM

## 2014-07-28 DIAGNOSIS — R031 Nonspecific low blood-pressure reading: Secondary | ICD-10-CM | POA: Diagnosis not present

## 2014-07-28 DIAGNOSIS — I10 Essential (primary) hypertension: Secondary | ICD-10-CM | POA: Diagnosis present

## 2014-07-28 DIAGNOSIS — E871 Hypo-osmolality and hyponatremia: Secondary | ICD-10-CM | POA: Diagnosis present

## 2014-07-28 DIAGNOSIS — C3490 Malignant neoplasm of unspecified part of unspecified bronchus or lung: Principal | ICD-10-CM | POA: Diagnosis present

## 2014-07-28 DIAGNOSIS — G9520 Unspecified cord compression: Secondary | ICD-10-CM | POA: Diagnosis present

## 2014-07-28 DIAGNOSIS — Y95 Nosocomial condition: Secondary | ICD-10-CM | POA: Diagnosis present

## 2014-07-28 DIAGNOSIS — R059 Cough, unspecified: Secondary | ICD-10-CM

## 2014-07-28 DIAGNOSIS — J9600 Acute respiratory failure, unspecified whether with hypoxia or hypercapnia: Secondary | ICD-10-CM | POA: Diagnosis present

## 2014-07-28 DIAGNOSIS — E0590 Thyrotoxicosis, unspecified without thyrotoxic crisis or storm: Secondary | ICD-10-CM | POA: Diagnosis present

## 2014-07-28 DIAGNOSIS — Z8249 Family history of ischemic heart disease and other diseases of the circulatory system: Secondary | ICD-10-CM

## 2014-07-28 DIAGNOSIS — J9601 Acute respiratory failure with hypoxia: Secondary | ICD-10-CM | POA: Diagnosis present

## 2014-07-28 DIAGNOSIS — J90 Pleural effusion, not elsewhere classified: Secondary | ICD-10-CM | POA: Diagnosis not present

## 2014-07-28 DIAGNOSIS — Z823 Family history of stroke: Secondary | ICD-10-CM | POA: Diagnosis not present

## 2014-07-28 DIAGNOSIS — Z4659 Encounter for fitting and adjustment of other gastrointestinal appliance and device: Secondary | ICD-10-CM

## 2014-07-28 DIAGNOSIS — N179 Acute kidney failure, unspecified: Secondary | ICD-10-CM | POA: Diagnosis not present

## 2014-07-28 DIAGNOSIS — J948 Other specified pleural conditions: Secondary | ICD-10-CM

## 2014-07-28 DIAGNOSIS — Z7189 Other specified counseling: Secondary | ICD-10-CM | POA: Diagnosis not present

## 2014-07-28 DIAGNOSIS — J9690 Respiratory failure, unspecified, unspecified whether with hypoxia or hypercapnia: Secondary | ICD-10-CM

## 2014-07-28 DIAGNOSIS — D500 Iron deficiency anemia secondary to blood loss (chronic): Secondary | ICD-10-CM

## 2014-07-28 DIAGNOSIS — D630 Anemia in neoplastic disease: Secondary | ICD-10-CM | POA: Diagnosis present

## 2014-07-28 DIAGNOSIS — R042 Hemoptysis: Secondary | ICD-10-CM

## 2014-07-28 DIAGNOSIS — J910 Malignant pleural effusion: Secondary | ICD-10-CM | POA: Diagnosis not present

## 2014-07-28 DIAGNOSIS — N400 Enlarged prostate without lower urinary tract symptoms: Secondary | ICD-10-CM | POA: Diagnosis present

## 2014-07-28 DIAGNOSIS — Z833 Family history of diabetes mellitus: Secondary | ICD-10-CM | POA: Diagnosis not present

## 2014-07-28 DIAGNOSIS — R0989 Other specified symptoms and signs involving the circulatory and respiratory systems: Secondary | ICD-10-CM

## 2014-07-28 DIAGNOSIS — I82B12 Acute embolism and thrombosis of left subclavian vein: Secondary | ICD-10-CM | POA: Diagnosis present

## 2014-07-28 DIAGNOSIS — R34 Anuria and oliguria: Secondary | ICD-10-CM | POA: Diagnosis not present

## 2014-07-28 DIAGNOSIS — E872 Acidosis: Secondary | ICD-10-CM | POA: Diagnosis present

## 2014-07-28 DIAGNOSIS — J189 Pneumonia, unspecified organism: Secondary | ICD-10-CM | POA: Diagnosis not present

## 2014-07-28 DIAGNOSIS — D649 Anemia, unspecified: Secondary | ICD-10-CM | POA: Diagnosis present

## 2014-07-28 DIAGNOSIS — E049 Nontoxic goiter, unspecified: Secondary | ICD-10-CM | POA: Diagnosis present

## 2014-07-28 DIAGNOSIS — C801 Malignant (primary) neoplasm, unspecified: Secondary | ICD-10-CM

## 2014-07-28 DIAGNOSIS — Z9889 Other specified postprocedural states: Secondary | ICD-10-CM

## 2014-07-28 DIAGNOSIS — D61818 Other pancytopenia: Secondary | ICD-10-CM | POA: Diagnosis present

## 2014-07-28 DIAGNOSIS — R509 Fever, unspecified: Secondary | ICD-10-CM | POA: Diagnosis not present

## 2014-07-28 DIAGNOSIS — R05 Cough: Secondary | ICD-10-CM

## 2014-07-28 DIAGNOSIS — R627 Adult failure to thrive: Secondary | ICD-10-CM | POA: Diagnosis present

## 2014-07-28 DIAGNOSIS — J9602 Acute respiratory failure with hypercapnia: Secondary | ICD-10-CM | POA: Diagnosis present

## 2014-07-28 DIAGNOSIS — C799 Secondary malignant neoplasm of unspecified site: Secondary | ICD-10-CM

## 2014-07-28 DIAGNOSIS — M069 Rheumatoid arthritis, unspecified: Secondary | ICD-10-CM | POA: Diagnosis present

## 2014-07-28 DIAGNOSIS — D696 Thrombocytopenia, unspecified: Secondary | ICD-10-CM | POA: Insufficient documentation

## 2014-07-28 DIAGNOSIS — R0489 Hemorrhage from other sites in respiratory passages: Secondary | ICD-10-CM | POA: Diagnosis present

## 2014-07-28 DIAGNOSIS — R579 Shock, unspecified: Secondary | ICD-10-CM

## 2014-07-28 DIAGNOSIS — A419 Sepsis, unspecified organism: Secondary | ICD-10-CM | POA: Diagnosis not present

## 2014-07-28 DIAGNOSIS — C7951 Secondary malignant neoplasm of bone: Secondary | ICD-10-CM

## 2014-07-28 DIAGNOSIS — Z6825 Body mass index (BMI) 25.0-25.9, adult: Secondary | ICD-10-CM | POA: Diagnosis not present

## 2014-07-28 DIAGNOSIS — Z66 Do not resuscitate: Secondary | ICD-10-CM | POA: Diagnosis present

## 2014-07-28 DIAGNOSIS — Z7952 Long term (current) use of systemic steroids: Secondary | ICD-10-CM | POA: Diagnosis not present

## 2014-07-28 DIAGNOSIS — D6959 Other secondary thrombocytopenia: Secondary | ICD-10-CM | POA: Diagnosis present

## 2014-07-28 DIAGNOSIS — E440 Moderate protein-calorie malnutrition: Secondary | ICD-10-CM | POA: Diagnosis present

## 2014-07-28 DIAGNOSIS — Z923 Personal history of irradiation: Secondary | ICD-10-CM | POA: Diagnosis not present

## 2014-07-28 DIAGNOSIS — C7801 Secondary malignant neoplasm of right lung: Secondary | ICD-10-CM | POA: Diagnosis not present

## 2014-07-28 DIAGNOSIS — I150 Renovascular hypertension: Secondary | ICD-10-CM

## 2014-07-28 DIAGNOSIS — Z87891 Personal history of nicotine dependence: Secondary | ICD-10-CM

## 2014-07-28 DIAGNOSIS — R6521 Severe sepsis with septic shock: Secondary | ICD-10-CM | POA: Diagnosis not present

## 2014-07-28 DIAGNOSIS — D62 Acute posthemorrhagic anemia: Secondary | ICD-10-CM

## 2014-07-28 DIAGNOSIS — D520 Dietary folate deficiency anemia: Secondary | ICD-10-CM

## 2014-07-28 LAB — COMPREHENSIVE METABOLIC PANEL
ALT: 24 U/L (ref 17–63)
ANION GAP: 7 (ref 5–15)
AST: 17 U/L (ref 15–41)
Albumin: 1.9 g/dL — ABNORMAL LOW (ref 3.5–5.0)
Alkaline Phosphatase: 51 U/L (ref 38–126)
BUN: 37 mg/dL — ABNORMAL HIGH (ref 6–20)
CHLORIDE: 96 mmol/L — AB (ref 101–111)
CO2: 21 mmol/L — ABNORMAL LOW (ref 22–32)
Calcium: 7.4 mg/dL — ABNORMAL LOW (ref 8.9–10.3)
Creatinine, Ser: 0.55 mg/dL — ABNORMAL LOW (ref 0.61–1.24)
GFR calc Af Amer: >60 mL/min (ref >60)
GFR calc non Af Amer: >60 mL/min (ref >60)
Glucose, Bld: 77 mg/dL (ref 65–99)
POTASSIUM: 4.2 mmol/L (ref 3.5–5.1)
SODIUM: 124 mmol/L — AB (ref 135–145)
Total Bilirubin: 0.7 mg/dL (ref 0.3–1.2)
Total Protein: 4 g/dL — ABNORMAL LOW (ref 6.5–8.1)

## 2014-07-28 LAB — BODY FLUID CELL COUNT WITH DIFFERENTIAL
Eos, Fluid: 1 %
Lymphs, Fluid: 1 %
Monocyte-Macrophage-Serous Fluid: 12 % — ABNORMAL LOW (ref 50–90)
Neutrophil Count, Fluid: 86 % — ABNORMAL HIGH (ref 0–25)
Total Nucleated Cell Count, Fluid: UNDETERMINED cu mm (ref 0–1000)

## 2014-07-28 LAB — CBC WITH DIFFERENTIAL/PLATELET
Basophils Absolute: 0 10*3/uL (ref 0.0–0.1)
Basophils Relative: 0 % (ref 0–1)
EOS PCT: 3 % (ref 0–5)
Eosinophils Absolute: 0.3 10*3/uL (ref 0.0–0.7)
HCT: 18.1 % — ABNORMAL LOW (ref 39.0–52.0)
Hemoglobin: 6.3 g/dL — CL (ref 13.0–17.0)
Lymphocytes Relative: 3 % — ABNORMAL LOW (ref 12–46)
Lymphs Abs: 0.3 10*3/uL — ABNORMAL LOW (ref 0.7–4.0)
MCH: 31.7 pg (ref 26.0–34.0)
MCHC: 34.8 g/dL (ref 30.0–36.0)
MCV: 91 fL (ref 78.0–100.0)
MONOS PCT: 5 % (ref 3–12)
Monocytes Absolute: 0.5 10*3/uL (ref 0.1–1.0)
NEUTROS ABS: 8.1 10*3/uL — AB (ref 1.7–7.7)
NEUTROS PCT: 89 % — AB (ref 43–77)
Platelets: 45 10*3/uL — ABNORMAL LOW (ref 150–400)
RBC: 1.99 MIL/uL — AB (ref 4.22–5.81)
RDW: 16.4 % — ABNORMAL HIGH (ref 11.5–15.5)
WBC: 9.2 10*3/uL (ref 4.0–10.5)

## 2014-07-28 LAB — PREPARE RBC (CROSSMATCH)

## 2014-07-28 LAB — I-STAT TROPONIN, ED: Troponin i, poc: 0.05 ng/mL (ref 0.00–0.08)

## 2014-07-28 LAB — LACTATE DEHYDROGENASE, PLEURAL OR PERITONEAL FLUID: LD FL: 221 U/L — AB (ref 3–23)

## 2014-07-28 LAB — LIPASE, BLOOD: Lipase: 19 U/L — ABNORMAL LOW (ref 22–51)

## 2014-07-28 LAB — BRAIN NATRIURETIC PEPTIDE: B Natriuretic Peptide: 55.8 pg/mL (ref 0.0–100.0)

## 2014-07-28 LAB — POC OCCULT BLOOD, ED: Fecal Occult Bld: POSITIVE — AB

## 2014-07-28 LAB — PROTEIN, BODY FLUID

## 2014-07-28 MED ORDER — SODIUM CHLORIDE 0.9 % IV BOLUS (SEPSIS): 1000.0000 mL | Freq: Once | INTRAVENOUS | Status: DC

## 2014-07-28 MED ORDER — SODIUM CHLORIDE 0.9 % IV SOLN: Freq: Once | INTRAVENOUS | Status: DC

## 2014-07-28 MED ORDER — SODIUM CHLORIDE 0.9 % IV BOLUS (SEPSIS)
1000.0000 mL | Freq: Once | INTRAVENOUS | Status: AC
Start: 2014-07-28 — End: 2014-07-28
  Administered 2014-07-28: 1000 mL via INTRAVENOUS

## 2014-07-28 MED ORDER — OXYCODONE HCL 5 MG PO TABS
5.0000 mg | ORAL_TABLET | ORAL | Status: DC | PRN
  Administered 2014-07-28: 10 mg via ORAL
  Administered 2014-07-29 – 2014-07-31 (×2): 5 mg via ORAL
  Filled 2014-07-28 (×4): qty 1

## 2014-07-28 MED ORDER — DEXAMETHASONE 2 MG PO TABS
8.0000 mg | ORAL_TABLET | Freq: Two times a day (BID) | ORAL | Status: DC
  Administered 2014-07-28 – 2014-07-30 (×4): 8 mg via ORAL
  Filled 2014-07-28 (×4): qty 4

## 2014-07-28 MED ORDER — SODIUM CHLORIDE 0.9 % IV BOLUS (SEPSIS): 2000.0000 mL | Freq: Once | INTRAVENOUS | Status: DC

## 2014-07-28 MED ORDER — OXYCODONE HCL ER 15 MG PO T12A
15.0000 mg | EXTENDED_RELEASE_TABLET | Freq: Two times a day (BID) | ORAL | Status: DC
Start: 2014-07-28 — End: 2014-07-31
  Administered 2014-07-28 – 2014-07-30 (×5): 15 mg via ORAL
  Filled 2014-07-28 (×7): qty 1

## 2014-07-28 MED ORDER — DOCUSATE SODIUM 100 MG PO CAPS
200.0000 mg | ORAL_CAPSULE | Freq: Every day | ORAL | Status: DC
  Administered 2014-07-29 – 2014-07-30 (×2): 200 mg via ORAL
  Filled 2014-07-28 (×3): qty 2

## 2014-07-28 MED ORDER — POLYETHYLENE GLYCOL 3350 17 G PO PACK: 17.0000 g | PACK | Freq: Every day | ORAL | Status: DC | PRN

## 2014-07-28 MED ORDER — PANTOPRAZOLE SODIUM 40 MG PO TBEC
40.0000 mg | DELAYED_RELEASE_TABLET | Freq: Two times a day (BID) | ORAL | Status: DC
  Administered 2014-07-28 – 2014-07-30 (×5): 40 mg via ORAL
  Filled 2014-07-28 (×7): qty 1

## 2014-07-28 MED ORDER — PIPERACILLIN-TAZOBACTAM 3.375 G IVPB 30 MIN
3.3750 g | INTRAVENOUS | Status: AC
  Administered 2014-07-28: 3.375 g via INTRAVENOUS
  Filled 2014-07-28 (×2): qty 50

## 2014-07-28 MED ORDER — SODIUM CHLORIDE 0.9 % IV SOLN
250.0000 mL | INTRAVENOUS | Status: DC | PRN
  Administered 2014-07-30: 250 mL via INTRAVENOUS

## 2014-07-28 MED ORDER — ONDANSETRON HCL 4 MG/2ML IJ SOLN: 4.0000 mg | Freq: Four times a day (QID) | INTRAMUSCULAR | Status: DC | PRN

## 2014-07-28 MED ORDER — VANCOMYCIN HCL IN DEXTROSE 1-5 GM/200ML-% IV SOLN
1000.0000 mg | INTRAVENOUS | Status: AC
  Administered 2014-07-28: 1000 mg via INTRAVENOUS
  Filled 2014-07-28: qty 200

## 2014-07-28 MED ORDER — VANCOMYCIN HCL IN DEXTROSE 750-5 MG/150ML-% IV SOLN
750.0000 mg | Freq: Three times a day (TID) | INTRAVENOUS | Status: DC
  Administered 2014-07-29 – 2014-07-30 (×5): 750 mg via INTRAVENOUS
  Filled 2014-07-28 (×6): qty 150

## 2014-07-28 MED ORDER — ACETAMINOPHEN 325 MG PO TABS: 650.0000 mg | ORAL_TABLET | ORAL | Status: DC | PRN

## 2014-07-28 MED ORDER — IOHEXOL 350 MG/ML SOLN
100.0000 mL | Freq: Once | INTRAVENOUS | Status: AC | PRN
  Administered 2014-07-28: 100 mL via INTRAVENOUS

## 2014-07-28 MED ORDER — SENNA 8.6 MG PO TABS: 1.0000 | ORAL_TABLET | Freq: Every day | ORAL | Status: DC | PRN

## 2014-07-28 MED ORDER — TRAMADOL HCL 50 MG PO TABS: 50.0000 mg | ORAL_TABLET | Freq: Four times a day (QID) | ORAL | Status: DC | PRN

## 2014-07-28 MED ORDER — PIPERACILLIN-TAZOBACTAM 3.375 G IVPB
3.3750 g | Freq: Three times a day (TID) | INTRAVENOUS | Status: DC
  Administered 2014-07-29 – 2014-07-30 (×5): 3.375 g via INTRAVENOUS
  Filled 2014-07-28 (×5): qty 50

## 2014-07-28 MED ORDER — METHIMAZOLE 10 MG PO TABS
60.0000 mg | ORAL_TABLET | Freq: Every day | ORAL | Status: DC
  Administered 2014-07-29 – 2014-08-02 (×4): 60 mg via ORAL
  Filled 2014-07-28 (×6): qty 6

## 2014-07-28 MED ORDER — SODIUM CHLORIDE 0.9 % IV SOLN
INTRAVENOUS | Status: DC
  Administered 2014-07-28: 1000 mL via INTRAVENOUS
  Administered 2014-07-30: 14:00:00 via INTRAVENOUS

## 2014-07-28 MED ORDER — PANTOPRAZOLE SODIUM 40 MG PO TBEC: 40.0000 mg | DELAYED_RELEASE_TABLET | Freq: Every day | ORAL | Status: DC

## 2014-07-28 NOTE — ED Notes (Signed)
Attempted to call report; rn to return call

## 2014-07-28 NOTE — ED Notes (Signed)
Per dr. Corinna Lines (e-link) pt does not need to be on isolation precautions.  Will advise ICU staff.

## 2014-07-28 NOTE — ED Notes (Signed)
Report called to Hoyle Sauer, RN ICU

## 2014-07-28 NOTE — H&P (Addendum)
PULMONARY / CRITICAL CARE MEDICINE   Name: [name] [name] MRN: [id number] DOB: Sep 21, 1940    ADMISSION DATE:  07/15/2014 CONSULTATION DATE:  07/12/2014  REFERRING MD :  Emergency room physician  CHIEF COMPLAINT:  Shortness of breath, hemoptysis  INITIAL PRESENTATION: 74 year old male with newly diagnosed metastatic adenocarcinoma of uncertain origin was admitted through the [facility] emergency department on 07/13/2014 for acute hypoxemic respiratory failure, hemoptysis, and a large right pleural effusion.  STUDIES:  CT chest May 30: Right upper lobe consolidation with local interstitial infiltration significantly increased from previous, large right-sided pleural effusion, moderate-sized left-sided pleural effusion, improved neck lymphadenopathy compared to May 5 study  SIGNIFICANT EVENTS: 07/16/2014 thoracentesis: 1500 mL of very bloody fluid removed   HISTORY OF PRESENT ILLNESS:  This is a 74 year old male who has newly diagnosed metastatic adenocarcinoma of uncertain primary origin who was admitted on 07/26/2014 for hemoptysis and shortness of breath. He was recently hospitalized at our facility from May 5 through May 20 where he was diagnosed with adenocarcinoma. He initially presented with neck swelling and some weight loss. He was found to have necrotic appearing lymphadenopathy in his neck. He underwent 2 core biopsies both of which showed adenocarcinoma. Special stains suggested either thyroid or lung origin. Apparently genetic testing is currently pending. A CT scan of his chest at that time showed small bilateral pleural effusions as well as an infiltrative process in the right upper lobe. He also had metastasis throughout his ribs and thoracic spine and had significant right leg weakness and severe back pain. Oncology and radiation oncology were consulted and he was treated with radiation therapy to the cervical lymph nodes as well as the thoracic spine. He was noted to be profoundly  malnourished and very weak and was felt that his overall prognosis was poor. He was discharged to a nursing home purely for insurance reasons despite his markedly deconditioned state. At the nursing facility he continued to receive radiation therapy and he was participating with physical therapy. He was able to stand with significant help. Even just to sit up he requires significant amount of help.   He provides some of the history but most of the history comes from his son who accompanies him. His son states that he started coughing up blood 6 days prior to admission. This is associated with some shortness of breath. The cough progressed significantly and as of one day prior to admission he coughed up "half a cup" of bright red blood. He came to the emergency room today for further evaluation. He notes increasing shortness of breath during this time.  PAST MEDICAL HISTORY :   has a past medical history of Lung nodule (2012); Insomnia; Hypertension; Pneumonia; AAA (abdominal aortic aneurysm); Joint pain; Joint swelling; Urinary frequency; Urinary urgency; Nocturia; Enlarged prostate; and Rheumatoid arthritis(714.0).  has past surgical history that includes Inguinal hernia repair (2008); Appendectomy; and Abdominal aortic endovascular stent graft (Bilateral, 12/11/2013). Prior to Admission medications   Medication Sig Start Date End Date Taking? Authorizing Provider  ALPRAZolam Duanne Moron) 0.5 MG tablet Take 1 tablet (0.5 mg total) by mouth every 6 (six) hours as needed for anxiety. 07/18/14  Yes Thurnell Lose, MD  benazepril (LOTENSIN) 20 MG tablet Take 20 mg by mouth daily.   Yes Historical Provider, MD  Calcium Carbonate-Vitamin D (CALCIUM-D) 600-400 MG-UNIT TABS Take 1 tablet by mouth daily.   Yes Historical Provider, MD  Cholecalciferol (D 2000) 2000 UNITS TABS Take 1 tablet by mouth daily.  Yes Historical Provider, MD  dexamethasone (DECADRON) 4 MG tablet Take 2 tablets (8 mg total) by mouth every  12 (twelve) hours. 07/18/14  Yes Thurnell Lose, MD  docusate sodium (COLACE) 100 MG capsule Take 200 mg by mouth daily.   Yes Historical Provider, MD  fish oil-omega-3 fatty acids 1000 MG capsule Take 1 g by mouth daily.   Yes Historical Provider, MD  fluconazole (DIFLUCAN) 100 MG tablet Take 100 mg by mouth daily.   Yes Historical Provider, MD  folic acid (FOLVITE) 1 MG tablet Take 1 mg by mouth daily.   Yes Historical Provider, MD  hydrALAZINE (APRESOLINE) 50 MG tablet Take 1 tablet (50 mg total) by mouth 3 (three) times daily. 07/18/14  Yes Thurnell Lose, MD  hydroxychloroquine (PLAQUENIL) 200 MG tablet Take 200 mg by mouth 2 (two) times daily.   Yes Historical Provider, MD  methimazole (TAPAZOLE) 10 MG tablet Take 6 tablets (60 mg total) by mouth daily. 07/18/14  Yes Thurnell Lose, MD  metoprolol succinate (TOPROL-XL) 50 MG 24 hr tablet Take 1 tablet (50 mg total) by mouth daily. 07/18/14  Yes Thurnell Lose, MD  Multiple Vitamin (MULTIVITAMIN) tablet Take 1 tablet by mouth daily.   Yes Historical Provider, MD  Nutritional Supplements (GLUCERNA 1.5 CAL) LIQD One can 3 times a day with meals 07/18/14  Yes Thurnell Lose, MD  ondansetron (ZOFRAN) 4 MG tablet Take 1 tablet (4 mg total) by mouth every 8 (eight) hours as needed for nausea or vomiting. 07/18/14  Yes Thurnell Lose, MD  oxyCODONE (OXY IR/ROXICODONE) 5 MG immediate release tablet Take 1-2 tablets (5-10 mg total) by mouth every 4 (four) hours as needed for moderate pain. 07/18/14  Yes Thurnell Lose, MD  OxyCODONE (OXYCONTIN) 15 mg T12A 12 hr tablet Take 1 tablet (15 mg total) by mouth every 12 (twelve) hours. 07/18/14  Yes Thurnell Lose, MD  pantoprazole (PROTONIX) 40 MG tablet Take 1 tablet (40 mg total) by mouth 2 (two) times daily. 07/18/14  Yes Thurnell Lose, MD  PROMETHAZINE HCL IJ Inject 25 mg into the muscle every 6 (six) hours as needed (nausea and vomiting).   Yes Historical Provider, MD  senna (SENOKOT) 8.6 MG  TABS tablet Take 1-2 tablets QHS PRN CONSTIPATION Patient taking differently: Take 1 tablet by mouth every 8 (eight) hours as needed for mild constipation (constipation).  07/21/14  Yes Eppie Gibson, MD  traMADol (ULTRAM) 50 MG tablet Take 1 tablet (50 mg total) by mouth every 6 (six) hours as needed for moderate pain. 12/12/13  Yes Samantha J Rhyne, PA-C  vitamin E (VITAMIN E) 400 UNIT capsule Take 400 Units by mouth 2 (two) times daily.    Yes Historical Provider, MD  chlorproMAZINE (THORAZINE) 25 MG tablet Take 1 tablet (25 mg total) by mouth 4 (four) times daily as needed for hiccoughs. 07/18/14   Thurnell Lose, MD  ipratropium-albuterol (DUONEB) 0.5-2.5 (3) MG/3ML SOLN Take 3 mLs by nebulization every 4 (four) hours as needed. 07/18/14   Thurnell Lose, MD  polyethylene glycol (MIRALAX / GLYCOLAX) packet Take 17 g by mouth daily as needed for mild constipation. 07/18/14   Thurnell Lose, MD   Allergies  Allergen Reactions  . Other     Catfish + IV pain medication (unknown name)-vomits    FAMILY HISTORY:  indicated that his mother is deceased. He indicated that his father is deceased.  SOCIAL HISTORY:  reports that he has quit  smoking. His smoking use included Cigarettes. He has never used smokeless tobacco. He reports that he does not drink alcohol or use illicit drugs.  REVIEW OF SYSTEMS:   Gen: Denies fever, chills, weight change, + fatigue, night sweats HEENT: Denies blurred vision, double vision, hearing loss, tinnitus, sinus congestion, rhinorrhea, sore throat, neck stiffness, dysphagia PULM: Per history of present illness CV: Denies chest pain, edema, orthopnea, paroxysmal nocturnal dyspnea, palpitations GI: Denies abdominal pain, nausea, vomiting, diarrhea, hematochezia, melena, constipation, change in bowel habits GU: Denies dysuria, hematuria, polyuria, oliguria, urethral discharge Endocrine: Denies hot or cold intolerance, polyuria, polyphagia or appetite change Derm:  Denies rash, dry skin, scaling or peeling skin change Heme: Denies easy bruising, bleeding, bleeding gums Neuro: Denies headache, numbness, weakness, slurred speech, loss of memory or consciousness   SUBJECTIVE:   VITAL SIGNS: Temp:  [97.7 F (36.5 C)-98.4 F (36.9 C)] 98.4 F (36.9 C) (05/30 1728) Pulse Rate:  [60-89] 85 (05/30 1747) Resp:  [15-23] 23 (05/30 1728) BP: (77-113)/(41-57) 113/54 mmHg (05/30 1747) SpO2:  [90 %-96 %] 92 % (05/30 1747) HEMODYNAMICS:   VENTILATOR SETTINGS:   INTAKE / OUTPUT: No intake or output data in the 24 hours ending 07/12/2014 1825  PHYSICAL EXAMINATION: General:  Profoundly weak and pale appearing Asian male lying in bed in respiratory distress with accessory muscle use Neuro:  Awake and alert, interactive but quite weak, right leg neuro exam is notable for weakness in dorsiflexion of the foot3/5, extension of the hip 3/5, left leg strength is 5 out of 5 HEENT:  No trauma or blue bruising, oropharynx clear, no stigmata of bleeding, neck masses significantly improved since my last exam Cardiovascular:  Regular rate and rhythm no systolic murmur Lungs:  Diminished breath sounds throughout the right, some crackles, significant rhonchi bilaterally Abdomen:  Soft, nontender Musculoskeletal:  Significant atrophy of the right leg musculature throughout Skin:  Bruising over the right chest wall adjacent to radiation marking tattoos  LABS:  CBC  Recent Labs Lab 07/20/2014 1340  WBC 9.2  HGB 6.3*  HCT 18.1*  PLT 45*   Coag's No results for input(s): APTT, INR in the last 168 hours. BMET  Recent Labs Lab 07/18/2014 1340  NA 124*  K 4.2  CL 96*  CO2 21*  BUN 37*  CREATININE 0.55*  GLUCOSE 77   Electrolytes  Recent Labs Lab 07/16/2014 1340  CALCIUM 7.4*   Sepsis Markers No results for input(s): LATICACIDVEN, PROCALCITON, O2SATVEN in the last 168 hours. ABG No results for input(s): PHART, PCO2ART, PO2ART in the last 168 hours. Liver  Enzymes  Recent Labs Lab 07/11/2014 1340  AST 17  ALT 24  ALKPHOS 51  BILITOT 0.7  ALBUMIN 1.9*   Cardiac Enzymes No results for input(s): TROPONINI, PROBNP in the last 168 hours. Glucose No results for input(s): GLUCAP in the last 168 hours.  Imaging Ct Angio Chest Pe W/cm &/or Wo Cm  07/13/2014   CLINICAL DATA:  Increased cough and shortness of breath for 2 days. Low blood pressure. History of lung carcinoma with last chemotherapy 1 week ago.  EXAM: CT ANGIOGRAPHY CHEST WITH CONTRAST  TECHNIQUE: Multidetector CT imaging of the chest was performed using the standard protocol during bolus administration of intravenous contrast. Multiplanar CT image reconstructions and MIPs were obtained to evaluate the vascular anatomy.  CONTRAST:  149m OMNIPAQUE IOHEXOL 350 MG/ML SOLN  COMPARISON:  Chest CT, 07/03/2014.  FINDINGS: Angiographic study: Study degraded by motion somewhat limiting assessment for small segmental and subsegmental  pulmonary emboli. Allowing for this, there is no evidence a pulmonary embolus. There is diffuse dilation of the ascending and arch portion of the thoracic aorta. Ascending is dilated the most, measuring 4.5 cm x 4.4 cm transversely. There are atherosclerotic calcifications along the aortic arch and at the origins of its branch vessels. Atherosclerotic changes noted along the descending thoracic aorta. Pulmonary arteries are normal in caliber.  Thoracic inlet: Thyroid gland is low in attenuation with an enhancing margin. No discrete nodule. Ill-defined small nodular opacities are noted at the neck base on both sides consistent with shotty adenopathy. None of these nodes are larger than 1 cm short axis. These findings mildly improved from the prior CT.  Mediastinum and hila: Heart is normal in size. There are mild coronary artery calcifications. Mild ill-defined soft tissue is noted along the superior mediastinum consistent with sub cm shotty lymph nodes. There are no  pathologically enlarged lymph nodes in no discrete mass. There is no discrete hilar mass or pathologically enlarged lymph node.  Lungs and pleura: Small to moderate left and moderate to large right pleural effusions. There is extensive consolidation, with both coarsely thickened interstitial markings and patchy dense airspace consolidation, throughout much of the right upper lobe. There is some low attenuation material in the right lower lobe segmental bronchi likely mucus. The right lower lobe is collapsed. There is minor dependent subsegmental atelectasis in the right middle lobe. Some fluid extends along the minor fissure. Most of the left lower lobe is also collapse with a portion of its anterior aspect aerated. There is some mild reticular opacity centrally in the left upper lobe and a small amount of fluid along the oblique fissure. There is no pneumothorax. There is no pulmonary edema. The combination of the pleural effusions and atelectasis leads to mild shift of mediastinal structures and heart to the left.  Limited upper abdomen:  Unremarkable.  Musculoskeletal: There lytic lesions in the right posterior lateral fourth and seventh ribs. There is a lytic lesion with a subtle pathologic fracture, nondisplaced, in the sternal manubrium. This is along its right aspect. No other bone lesions.  Review of the MIP images confirms the above findings.  IMPRESSION: 1. No evidence of a pulmonary embolus. 2. Coarse reticular and dense airspace consolidation in much of the right upper lobe. Pneumonia suspected. However, this could reflect noninfectious inflammation related to treatment or even pulmonary hemorrhage. Smaller similar area of opacity is noted in the left upper lobe. 3. Moderate to large right and small to moderate left pleural effusions. There is complete atelectasis of the right lower lobe and significant atelectasis in the left lower lobe. No pulmonary edema. 4. Neck base adenopathy has mildly improved  from prior exam. 5. Lytic lesions in the right sternal manubrium and the right fourth and seventh ribs similar to the prior study.   Electronically Signed   By: Lajean Manes M.D.   On: 07/10/2014 17:07     ASSESSMENT / PLAN:  PULMONARY  A: Acute hypoxemic respiratory failure secondary to possible healthcare associated pneumonia, associated with hemoptysis and a large right-sided pleural effusion  Radiology feels that the right upper lobe consolidation seen on the CT chest is consistent with pneumonia, however I'm very concerned that this is actually a malignancy considering the clinical context.  Hemoptysis secondary to thrombocytopenia pneumonia versus mass> not massive  Very bloody pleural effusion> this is most likely due to pleural metastasis bleeding in the setting of thrombocytopenia  P:   Emergent thoracentesis  performed, 1500cc bloody fluid removed Send fluid for cytology and culture analysis, send pleural fluid for spine hematocrit Transfuse platelets now to try to slow down the bleeding Oxygen as needed for O2 saturation greater than 92% Treat for healthcare associated pneumonia,see ID Monitor closely in the intensive care unit   CARDIOVASCULAR  A: Sinus tachycardia secondary to bleeding P:  Telemetry monitoring Hold blood pressure medications for now Consider restarting blood pressure meds in the morning Gentle volume resuscitation  RENAL A:  No acute issues P:   Monitor BMET and UOP Replace electrolytes as needed   GASTROINTESTINAL A:  Heme positive stool but clinically not bleeding from GI tract P:   Monitor for bleeding Regular diet  HEMATOLOGIC A:  Hemorrhagic anemia Thrombocytopenia likely secondary to bleeding and/or radiation treatment DVT left subclavian vein Adenocarcinoma of uncertain etiology P:  Transfuse 1 unit of PRBC now check CBC afterwards Transfuse 1 unit of platelets now for active bleeding with thrombocytopenia No anticoagulation  considering bleeding and thrombocytopenia I have called the cancer center to have Dr.Ennever see patient the morning  INFECTIOUS A: Healthcare associated pneumonia P:   BCx2 will not collect, minimize phlebotomy Vancomycin May 30> Zosyn May 30>   ENDOCRINE A:  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 Marin Olp   FAMILY  - Updates: Talked to the patient's son at length on 06/29/2014 in the emergency department. I explained to him that his father's overall prognosis appears very poor considering his malnourished state, profound weakness, right leg neurologic weakness and atrophy, metastatic malignancy, and acute illness. I explained the nature of life support and CPR and felt that while we should continue to treat his father as aggressively as possible that life support would be harmful and not helpful. At this time the family is still in shock is not entirely clear what the next step in the patient's treatment is. They prefer to maintain his CODE STATUS as FULL CODE at this time.   - Consider early palliative medicine involvement   Patient is critically ill and I spent 90 minutes of critical care time admitting him to the hospital, reviewing records and discussing the situation with his son.  Roselie Awkward, MD  PCCM Pager: [phone] Cell: [phone] After 3pm or if no response, call [phone]   07/17/2014, 6:25 PM

## 2014-07-28 NOTE — ED Notes (Signed)
Paged dr. Lake Bells regarding isolation precautions; awaiting return call prior to transport to icu

## 2014-07-28 NOTE — ED Notes (Addendum)
Per EMS: pt from [facility], pt has low blood pressure and has blood in sputum. 22 g in left hand, 100 cc of NS. 3.5 liters at [facility] on 4 liters now at 93%. Last radiation for lung CA was 07/23/14.

## 2014-07-28 NOTE — ED Notes (Signed)
Patient is still unable to urinate at this time.

## 2014-07-28 NOTE — ED Notes (Signed)
MD at bedside. 

## 2014-07-28 NOTE — ED Notes (Signed)
Patient is unable to urinate at this time, he has urinal placed at bedside.

## 2014-07-28 NOTE — Procedures (Signed)
Thoracentesis Procedure Note  Pre-operative Diagnosis: Adenocarcinoma, right pleural effusion  Post-operative Diagnosis: same  Indications: Shortness of breath  Procedure Details  Consent: Informed consent was obtained. Risks of the procedure were discussed including: infection, bleeding, pain, pneumothorax.  Ultrasound was used to identify the best pocket of fluid. There was a large pleural effusion identified.  Under sterile conditions the patient was positioned. Betadine solution and sterile drapes were utilized.  1% buffered lidocaine was used to anesthetize the 8th rib space. Fluid was obtained without any difficulties and minimal blood loss.  A dressing was applied to the wound and wound care instructions were provided.   Findings 1500 ml of bloody pleural fluid was obtained. A sample was sent to Pathology for cytogenetics, flow, and cell counts, as well as for infection analysis.  Complications:  None; patient tolerated the procedure well.          Condition: stable  Plan A follow up chest x-ray was ordered. Bed Rest for 2 hours. Tylenol 650 mg. for pain.  Attending Attestation: I performed the procedure.  Roselie Awkward, MD [facility] Pager: [phone] Cell: [phone] After 3pm or if no response, call [phone]

## 2014-07-28 NOTE — Progress Notes (Signed)
ANTIBIOTIC CONSULT NOTE - INITIAL  Pharmacy Consult for Vancomycin, Zosyn Indication: pneumonia  Allergies  Allergen Reactions  . Other     Catfish + IV pain medication (unknown name)-vomits    Patient Measurements:    Vital Signs: Temp: 98.4 F (36.9 C) (05/30 1728) Temp Source: Oral (05/30 1728) BP: 106/53 mmHg (05/30 1800) Pulse Rate: 80 (05/30 1800) Intake/Output from previous day:   Intake/Output from this shift:    Labs:  Recent Labs  07/02/2014 1340  WBC 9.2  HGB 6.3*  PLT 45*  CREATININE 0.55*   Estimated Creatinine Clearance: 75 mL/min (by C-G formula based on Cr of 0.55). No results for input(s): VANCOTROUGH, VANCOPEAK, VANCORANDOM, GENTTROUGH, GENTPEAK, GENTRANDOM, TOBRATROUGH, TOBRAPEAK, TOBRARND, AMIKACINPEAK, AMIKACINTROU, AMIKACIN in the last 72 hours.   Microbiology: Recent Results (from the past 720 hour(s))  Culture, blood (routine x 2)     Status: None   Collection Time: 07/04/14  1:54 PM  Result Value Ref Range Status   Specimen Description BLOOD LEFT ANTECUBITAL  Final   Special Requests BOTTLES DRAWN AEROBIC AND ANAEROBIC 5 CC EA  Final   Culture   Final    NO GROWTH 5 DAYS Performed at [facility]    Report Status 07/10/2014 FINAL  Final  Culture, blood (routine x 2)     Status: None   Collection Time: 07/04/14  1:55 PM  Result Value Ref Range Status   Specimen Description BLOOD BLOOD RIGHT FOREARM  Final   Special Requests BOTTLES DRAWN AEROBIC AND ANAEROBIC 5 CC EA  Final   Culture   Final    NO GROWTH 5 DAYS Performed at [facility]    Report Status 07/10/2014 FINAL  Final  Culture, Urine     Status: None   Collection Time: 07/04/14  1:59 PM  Result Value Ref Range Status   Specimen Description URINE, RANDOM  Final   Special Requests NONE  Final   Colony Count NO GROWTH Performed at [facility]   Final   Culture NO GROWTH Performed at [facility]   Final   Report Status 07/05/2014 FINAL   Final  MRSA PCR Screening     Status: None   Collection Time: 07/04/14  2:42 PM  Result Value Ref Range Status   MRSA by PCR NEGATIVE NEGATIVE Final    Comment:        The GeneXpert MRSA Assay (FDA approved for NASAL specimens only), is one component of a comprehensive MRSA colonization surveillance program. It is not intended to diagnose MRSA infection nor to guide or monitor treatment for MRSA infections.   Urine culture     Status: None   Collection Time: 07/16/14 11:26 AM  Result Value Ref Range Status   Specimen Description URINE, CLEAN CATCH  Final   Special Requests NONE  Final   Colony Count NO GROWTH Performed at [facility]   Final   Culture NO GROWTH Performed at [facility]   Final   Report Status 07/17/2014 FINAL  Final    Medical History: Past Medical History  Diagnosis Date  . Lung nodule 2012  . Insomnia     takes Restoril nightly as needed  . Hypertension     takes Benazepril and Metoprolol daily  . Pneumonia     hx of;many yrs ago  . AAA (abdominal aortic aneurysm)   . Joint pain   . Joint swelling   . Urinary frequency     takes  Terazosin daily  . Urinary urgency   . Nocturia   . Enlarged prostate     slightly  . Rheumatoid arthritis(714.0)     takes Plaquenil daily and Methotrexate weekly    Assessment: 48 yoM with newly diagnosed metastatic adenocarcinoma of uncertain origin admitted for acute hypoxemic respiratory failure secondary to possible HCAP, hemoptysis, and a large right pleural effusion.  Patient was recently discharged on 07/18/14.  CT chest shows right upper lobe consolidation with local interstitial infiltration and right/left pleural effusions.  Emergent thoracentesis performed in ED and 1500 mL of very bloody fluid removed, sent for culture.  Pharmacy consulted to dose Vancomycin and Zosyn for HCAP.  Pt is afebrile, WBC WNL, CrCl~75 ml/min.  Goal of Therapy:  Vancomycin trough level 15-20 mcg/ml  Doses  adjusted per renal function Eradication of infection  Plan:  1.  Vancomycin 1g IV x 1 then 750 mg IV q8h (based on dosing during recent admission for pneumonia). 2.  Zosyn 3.375g IV q8h (4 hour infusion time).  3.  F/u SCr, trough levels, clinical course.  Hershal Coria 07/15/2014,7:08 PM

## 2014-07-28 NOTE — Clinical Social Work Note (Signed)
Clinical Social Work Assessment  Patient Details  Name: [name] [name] MRN: [id number] Date of Birth: [birth year]  Date of referral:  07/25/2014               Reason for consult:   (Pt is from Facility ([facility]))                Permission sought to share information with:   (None.) Permission granted to share information::  No  Name::        Agency::     Relationship::     Contact Information:     Housing/Transportation Living arrangements for the past 2 months:   (Son informed CSW that the pt has been living at [facility] for 2 weeks.) Source of Information:  Patient, Adult Children (Patient and Son both participated in assessment interview.) Patient Interpreter Needed:  None Criminal Activity/Legal Involvement Pertinent to Current Situation/Hospitalization:  No - Comment as needed Significant Relationships:  Adult Children (Son informed CSW that he lives in [place] and is the primary support for pt. He states that he visits the pt x2 a day.) Lives with:  Facility Resident Do you feel safe going back to the place where you live?  Yes Need for family participation in patient care:  Yes (Comment) (CSW beleives it will be helpful if family continues to partcipate in the pt's care.)  Care giving concerns:  The pt is currently living at a facility [facility]). Patient informed CSW that staff assist him with completing his ADL's. Son informed CSW that the pt has been living at the facility for the past 2 weeks.    Social Worker assessment / plan:  CSW met with pt at  Bedside. Son was present. Per note, son believes pt is becoming weaker and grew concerned. Son informed CSW that the pt presents to [facility] due to having blood in his saliva.   Patient states that he receives assistance with completing ADL's. He also states that he fell x3 in 6 months. Patient informed CSW that he believes he needs a higher level of care.  Son informed CSW that the pt has been using a wheel chair lately. He  also states that the pt has been staying in the bed.  CT tech came to bedside to transport the pt for a CT scan.  Employment status:  Other (Comment) (Son states that the pt is not retired. Son informed CSW that the pt is on "Sick leave" from his job.) Insurance information:   Nurse, mental health) PT Recommendations:  Not assessed at this time Information / Referral to community resources:   (The pt does not need a facility referral at this time. He stays at [facility].)  Patient/Family's Response to care:  Patient and son have an appropriate response to care. Both are aware that the pt will be admitted. Patient informed CSW that he feels as though he does need a higher level of care.  Patient/Family's Understanding of and Emotional Response to Diagnosis, Current Treatment, and Prognosis:  Patient and son are aware that he will be getting admitted. They state that they do not have an questions at this time.  Son/Yun Osier [phone]  Emotional Assessment Appearance:  Appears stated age Attitude/Demeanor/Rapport:   (Well Mannered. Appropriate.) Affect (typically observed):  Accepting Orientation:  Oriented to Self, Oriented to Place, Oriented to  Time, Oriented to Situation Alcohol / Substance use:   (Patient states that he does not smoke now. He staes he quit 40 years  ago.) Psych involvement (Current and /or in the community):  No (Comment)  Discharge Needs  Concerns to be addressed:  Adjustment to Illness Readmission within the last 30 days:  Yes Current discharge risk:  None Barriers to Discharge:  No Barriers Identified   Bernita Buffy, LCSW 07/20/2014, 6:08 PM

## 2014-07-28 NOTE — ED Notes (Signed)
Unable to collect labs at this time due to patient receiving blood.

## 2014-07-28 NOTE — ED Provider Notes (Signed)
CSN: [id number]     Arrival date & time 07/14/2014  1249 History   First MD Initiated Contact with Patient 07/23/2014 1304     Chief Complaint  Patient presents with  . Hypotension  . Hemoptysis  . Emesis     (Consider location/radiation/quality/duration/timing/severity/associated sxs/prior Treatment) Patient is a 74 y.o. male presenting with cough.  Cough Cough characteristics:  Productive Sputum characteristics:  Bloody Severity:  Moderate Onset quality:  Gradual Duration:  2 days Timing:  Constant Progression:  Worsening Chronicity:  New Context comment:  Known lung mass vs infectious process, adenocarcinoma of spine, rib Relieved by:  Nothing Worsened by:  Nothing tried Ineffective treatments:  None tried Associated symptoms: weight loss   Associated symptoms: no chest pain and no fever     Past Medical History  Diagnosis Date  . Lung nodule 2012  . Insomnia     takes Restoril nightly as needed  . Hypertension     takes Benazepril and Metoprolol daily  . Pneumonia     hx of;many yrs ago  . AAA (abdominal aortic aneurysm)   . Joint pain   . Joint swelling   . Urinary frequency     takes Terazosin daily  . Urinary urgency   . Nocturia   . Enlarged prostate     slightly  . Rheumatoid arthritis(714.0)     takes Plaquenil daily and Methotrexate weekly   Past Surgical History  Procedure Laterality Date  . Inguinal hernia repair  2008  . Appendectomy    . Abdominal aortic endovascular stent graft Bilateral 12/11/2013    Procedure: ABDOMINAL AORTIC ENDOVASCULAR STENT GRAFT; left groin exploration;  Surgeon: Rosetta Posner, MD;  Location: [facility] OR;  Service: Vascular;  Laterality: Bilateral;   Family History  Problem Relation Age of Onset  . Hypertension Father   . Stroke Father   . Hypertension Mother   . Diabetes Mother   . Hyperlipidemia Mother    History  Substance Use Topics  . Smoking status: Former Smoker    Types: Cigarettes  . Smokeless tobacco: Never  Used     Comment: quit smoking at least 68yr ago  . Alcohol Use: No    Review of Systems  Constitutional: Positive for weight loss. Negative for fever.  Respiratory: Positive for cough.   Cardiovascular: Negative for chest pain.  All other systems reviewed and are negative.     Allergies  Other  Home Medications   Prior to Admission medications   Medication Sig Start Date End Date Taking? Authorizing Provider  ALPRAZolam (Duanne Moron 0.5 MG tablet Take 1 tablet (0.5 mg total) by mouth every 6 (six) hours as needed for anxiety. 07/18/14  Yes PThurnell Lose MD  benazepril (LOTENSIN) 20 MG tablet Take 20 mg by mouth daily.   Yes Historical Provider, MD  Calcium Carbonate-Vitamin D (CALCIUM-D) 600-400 MG-UNIT TABS Take 1 tablet by mouth daily.   Yes Historical Provider, MD  Cholecalciferol (D 2000) 2000 UNITS TABS Take 1 tablet by mouth daily.    Yes Historical Provider, MD  dexamethasone (DECADRON) 4 MG tablet Take 2 tablets (8 mg total) by mouth every 12 (twelve) hours. 07/18/14  Yes PThurnell Lose MD  docusate sodium (COLACE) 100 MG capsule Take 200 mg by mouth daily.   Yes Historical Provider, MD  fish oil-omega-3 fatty acids 1000 MG capsule Take 1 g by mouth daily.   Yes Historical Provider, MD  fluconazole (DIFLUCAN) 100 MG tablet Take 100 mg by mouth daily.  Yes Historical Provider, MD  folic acid (FOLVITE) 1 MG tablet Take 1 mg by mouth daily.   Yes Historical Provider, MD  hydrALAZINE (APRESOLINE) 50 MG tablet Take 1 tablet (50 mg total) by mouth 3 (three) times daily. 07/18/14  Yes Thurnell Lose, MD  hydroxychloroquine (PLAQUENIL) 200 MG tablet Take 200 mg by mouth 2 (two) times daily.   Yes Historical Provider, MD  methimazole (TAPAZOLE) 10 MG tablet Take 6 tablets (60 mg total) by mouth daily. 07/18/14  Yes Thurnell Lose, MD  metoprolol succinate (TOPROL-XL) 50 MG 24 hr tablet Take 1 tablet (50 mg total) by mouth daily. 07/18/14  Yes Thurnell Lose, MD  Multiple Vitamin  (MULTIVITAMIN) tablet Take 1 tablet by mouth daily.   Yes Historical Provider, MD  Nutritional Supplements (GLUCERNA 1.5 CAL) LIQD One can 3 times a day with meals 07/18/14  Yes Thurnell Lose, MD  ondansetron (ZOFRAN) 4 MG tablet Take 1 tablet (4 mg total) by mouth every 8 (eight) hours as needed for nausea or vomiting. 07/18/14  Yes Thurnell Lose, MD  oxyCODONE (OXY IR/ROXICODONE) 5 MG immediate release tablet Take 1-2 tablets (5-10 mg total) by mouth every 4 (four) hours as needed for moderate pain. 07/18/14  Yes Thurnell Lose, MD  OxyCODONE (OXYCONTIN) 15 mg T12A 12 hr tablet Take 1 tablet (15 mg total) by mouth every 12 (twelve) hours. 07/18/14  Yes Thurnell Lose, MD  pantoprazole (PROTONIX) 40 MG tablet Take 1 tablet (40 mg total) by mouth 2 (two) times daily. 07/18/14  Yes Thurnell Lose, MD  PROMETHAZINE HCL IJ Inject 25 mg into the muscle every 6 (six) hours as needed (nausea and vomiting).   Yes Historical Provider, MD  senna (SENOKOT) 8.6 MG TABS tablet Take 1-2 tablets QHS PRN CONSTIPATION Patient taking differently: Take 1 tablet by mouth every 8 (eight) hours as needed for mild constipation (constipation).  07/21/14  Yes Eppie Gibson, MD  traMADol (ULTRAM) 50 MG tablet Take 1 tablet (50 mg total) by mouth every 6 (six) hours as needed for moderate pain. 12/12/13  Yes Samantha J Rhyne, PA-C  vitamin E (VITAMIN E) 400 UNIT capsule Take 400 Units by mouth 2 (two) times daily.    Yes Historical Provider, MD  chlorproMAZINE (THORAZINE) 25 MG tablet Take 1 tablet (25 mg total) by mouth 4 (four) times daily as needed for hiccoughs. 07/18/14   Thurnell Lose, MD  ipratropium-albuterol (DUONEB) 0.5-2.5 (3) MG/3ML SOLN Take 3 mLs by nebulization every 4 (four) hours as needed. 07/18/14   Thurnell Lose, MD  polyethylene glycol (MIRALAX / GLYCOLAX) packet Take 17 g by mouth daily as needed for mild constipation. 07/18/14   Thurnell Lose, MD   BP 95/55 mmHg  Pulse 79  Temp(Src) 97.7 F  (36.5 C) (Oral)  Resp 17  SpO2 94% Physical Exam  Constitutional: He appears well-developed and well-nourished.  HENT:  Head: Normocephalic and atraumatic.  Eyes: Conjunctivae and EOM are normal.  Neck: Normal range of motion. Neck supple.  Cardiovascular: Normal rate, regular rhythm and normal heart sounds.   Pulmonary/Chest: Effort normal and breath sounds normal. No respiratory distress.  Abdominal: He exhibits no distension. There is no tenderness. There is no rebound and no guarding.  Musculoskeletal: Normal range of motion.  Neurological: He is alert.  Skin: Skin is warm and dry.  Vitals reviewed.   ED Course  Procedures (including critical care time) Labs Review Labs Reviewed  CBC WITH DIFFERENTIAL/PLATELET -  Abnormal; Notable for the following:    RBC 1.99 (*)    Hemoglobin 6.3 (*)    HCT 18.1 (*)    RDW 16.4 (*)    Platelets 45 (*)    Neutrophils Relative % 89 (*)    Lymphocytes Relative 3 (*)    Neutro Abs 8.1 (*)    Lymphs Abs 0.3 (*)    All other components within normal limits  COMPREHENSIVE METABOLIC PANEL - Abnormal; Notable for the following:    Sodium 124 (*)    Chloride 96 (*)    CO2 21 (*)    BUN 37 (*)    Creatinine, Ser 0.55 (*)    Calcium 7.4 (*)    Total Protein 4.0 (*)    Albumin 1.9 (*)    All other components within normal limits  LIPASE, BLOOD - Abnormal; Notable for the following:    Lipase 19 (*)    All other components within normal limits  POC OCCULT BLOOD, ED - Abnormal; Notable for the following:    Fecal Occult Bld POSITIVE (*)    All other components within normal limits  BRAIN NATRIURETIC PEPTIDE  URINALYSIS, ROUTINE W REFLEX MICROSCOPIC (NOT AT [facility])  OCCULT BLOOD X 1 CARD TO LAB, STOOL  I-STAT TROPOININ, ED  PREPARE RBC (CROSSMATCH)  TYPE AND SCREEN  TYPE AND SCREEN    Imaging Review No results found.   EKG Interpretation   Date/Time:  Monday Jul 28 2014 13:47:31 EDT Ventricular Rate:  74 PR Interval:  145 QRS  Duration: 94 QT Interval:  415 QTC Calculation: 460 R Axis:   70 Text Interpretation:  Sinus rhythm Baseline wander in lead(s) V2 V3 V5 No  significant change since last tracing Confirmed by Debby Freiberg [phone])  on 06/29/2014 4:15:27 PM       CRITICAL CARE Performed by: Debby Freiberg   Total critical care time: 95 min  Critical care time was exclusive of separately billable procedures and treating other patients.  Critical care was necessary to treat or prevent imminent or life-threatening deterioration.  Critical care was time spent personally by me on the following activities: development of treatment plan with patient and/or surrogate as well as nursing, discussions with consultants, evaluation of patient's response to treatment, examination of patient, obtaining history from patient or surrogate, ordering and performing treatments and interventions, ordering and review of laboratory studies, ordering and review of radiographic studies, pulse oximetry and re-evaluation of patient's condition.   MDM   Final diagnoses:  Cough  Shock    74 y.o. 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   Pt dc from complicated admission 10 days ago where he had GI bleed, subclavian thrombosis, and furthering of likely metastatic disease thought to have primary pulmonary origin (However this has not yet been confirmed, CT scan 5/2 demonstrated nonspecific RUL mass).  He had been doing well at nursing facility, however worsened acutely 48 hours ago.  On arrival pt requiring 2L [place] for O2 sats, with BP 77/42.  Exam nonspecific, no abd tenderness.  Hemoccult positive stool, but no reported blood loss or melena per history.  Wu as above obtained.  2 units PRBC ordered for possible hemorrhagic shock.  BP improved after 1L NS bolus, no fevers or indication for abx given overall clinical scenario.  I had a  frank discussion about poor prognosis with the pt's son, however they elected to stay full code.  CT scan of chest ordered.  Consulted critical care for admission.  I have reviewed all laboratory and imaging studies if ordered as above  1. Shock   2. Cough         Debby Freiberg, MD 07/29/2014 [phone]

## 2014-07-28 NOTE — ED Notes (Signed)
Made Dr. Colin Rhein aware that hemoccult positive

## 2014-07-28 NOTE — ED Notes (Signed)
Pt here from [facility].  Son believes father is becoming weaker and grew concerned, brought him to [facility] ER.    Pt has new diagnosis of Lung Cancer ? Metastasis.  He has completed his radiation treatments.  Someone mentioned to the son that pt could possibly be in CHF.  Dr. Marin Olp is his oncologist.    Son at bedside

## 2014-07-29 ENCOUNTER — Encounter (HOSPITAL_COMMUNITY): Payer: Self-pay

## 2014-07-29 ENCOUNTER — Inpatient Hospital Stay (HOSPITAL_COMMUNITY): Payer: BC Managed Care – PPO

## 2014-07-29 DIAGNOSIS — C7801 Secondary malignant neoplasm of right lung: Secondary | ICD-10-CM

## 2014-07-29 LAB — BASIC METABOLIC PANEL
Anion gap: 8 (ref 5–15)
BUN: 42 mg/dL — AB (ref 6–20)
CALCIUM: 7.2 mg/dL — AB (ref 8.9–10.3)
CO2: 18 mmol/L — ABNORMAL LOW (ref 22–32)
Chloride: 99 mmol/L — ABNORMAL LOW (ref 101–111)
Creatinine, Ser: 0.78 mg/dL (ref 0.61–1.24)
GFR calc non Af Amer: >60 mL/min (ref >60)
Glucose, Bld: 80 mg/dL (ref 65–99)
Potassium: 4.1 mmol/L (ref 3.5–5.1)
Sodium: 125 mmol/L — ABNORMAL LOW (ref 135–145)

## 2014-07-29 LAB — CBC
HCT: 25 % — ABNORMAL LOW (ref 39.0–52.0)
HEMOGLOBIN: 8.6 g/dL — AB (ref 13.0–17.0)
MCH: 30.5 pg (ref 26.0–34.0)
MCHC: 34.4 g/dL (ref 30.0–36.0)
MCV: 88.7 fL (ref 78.0–100.0)
Platelets: 51 10*3/uL — ABNORMAL LOW (ref 150–400)
RBC: 2.82 MIL/uL — AB (ref 4.22–5.81)
RDW: 15.9 % — ABNORMAL HIGH (ref 11.5–15.5)
WBC: 7.7 10*3/uL (ref 4.0–10.5)

## 2014-07-29 LAB — PREPARE PLATELET PHERESIS: Unit division: 0

## 2014-07-29 LAB — PH, BODY FLUID: pH, Fluid: 8.5

## 2014-07-29 MED ORDER — SODIUM CHLORIDE 0.9 % IV BOLUS (SEPSIS)
500.0000 mL | Freq: Once | INTRAVENOUS | Status: AC
  Administered 2014-07-29: 500 mL via INTRAVENOUS

## 2014-07-29 MED ORDER — CETYLPYRIDINIUM CHLORIDE 0.05 % MT LIQD
7.0000 mL | Freq: Two times a day (BID) | OROMUCOSAL | Status: DC
  Administered 2014-07-29 – 2014-07-30 (×3): 7 mL via OROMUCOSAL

## 2014-07-29 NOTE — Progress Notes (Signed)
  Radiation Oncology         [phone] ________________________________  Name: GRANTHAM HIPPERT MRN: [id number]  Date: 07/23/2014  DOB: 22-Nov-1940  End of Treatment Note  DIAGNOSIS:    ICD-9-CM ICD-10-CM   1. Bone metastases 198.5 C79.51        Indication for treatment:  palliative    Radiation treatment dates:   07/10/2014-07/23/2014  Site/dose:    1) Right hip/pelvis / 8 Gy in 1 fraction 2) Left rib cage / 8 Gy in 1 fraction 3) Right rib cage / 8 Gy in 1 fraction 4) Thyroid mass / 30 Gy in 10 fractions 5) L2-S1 / pelvis / 30 Gy in 10 fractions  Beams/energy:   1) 15X / AP/PA 2) 10X / AP/PA 3) 10X / AP/PA 4) 6X / 3D CONFORMAL 5) 15X / 3D CONFORMAL  Narrative: The patient tolerated radiation treatment relatively well.    Plan: The patient has completed radiation treatment. The patient will return to radiation oncology clinic for routine followup in one half month. I advised them to call or return sooner if they have any questions or concerns related to their recovery or treatment. -----------------------------------  Eppie Gibson, MD

## 2014-07-29 NOTE — Consult Note (Signed)
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 He was found to have acute hypoxic respiratory failure.  Chest x ray showed a large right pleural effusion. CT chest May 30 confirmed this Right upper lobe consolidation with local interstitial infiltration significantly increased from previous, large right-sided pleural effusion, moderate-sized left-sided pleural effusion, improved neck lymphadenopathy compared to 5/5 study. He underwent  Thoracentesis yielding 1500 mL of very bloody fluid removed. He is feeling better this morning, although continues to have hemoptysis. He is less short of breath, denies chest pain. He denies any nausea or vomiting. He denies any GU complaints. He is still having right sided weakness. Pain controlled    No history exists.    Past Medical History  Diagnosis Date  . Lung nodule 2012  . Insomnia     takes Restoril nightly as needed  . Hypertension     takes Benazepril and Metoprolol daily  . Pneumonia     hx of;many yrs ago  . AAA (abdominal aortic aneurysm)   . Joint pain   . Joint swelling   . Urinary frequency     takes Terazosin daily  . Urinary urgency   . Nocturia   . Enlarged prostate     slightly  . Rheumatoid arthritis(714.0)     takes Plaquenil daily and Methotrexate weekly     MEDICATIONS:  Scheduled Meds: . sodium chloride   Intravenous Once  . sodium chloride   Intravenous Once  . antiseptic oral rinse  7 mL Mouth Rinse BID  . dexamethasone  8 mg Oral Q12H  . docusate sodium  200 mg Oral Daily  . methimazole  60 mg Oral Daily  . OxyCODONE  15 mg Oral Q12H  . pantoprazole   40 mg Oral BID  . piperacillin-tazobactam (ZOSYN)  IV  3.375 g Intravenous Q8H  . sodium chloride  1,000 mL Intravenous Once  . vancomycin  750 mg Intravenous Q8H   Continuous Infusions: . sodium chloride 1,000 mL (07/16/2014 1900)   PRN Meds:.sodium chloride, acetaminophen, ondansetron (ZOFRAN) IV, oxyCODONE, polyethylene glycol, senna, traMADol  ALLERGIES:  Allergies  Allergen Reactions  . Other     Catfish + IV pain medication (unknown name)-vomits    Family History  Problem Relation Age of Onset  . Hypertension Father   . Stroke Father   . Hypertension Mother   . Diabetes Mother   . Hyperlipidemia Mother      Past Surgical History  Procedure Laterality Date  . Inguinal hernia repair  2008  . Appendectomy    . Abdominal aortic endovascular stent graft Bilateral 12/11/2013    Procedure: ABDOMINAL AORTIC ENDOVASCULAR STENT GRAFT; left groin exploration;  Surgeon: Rosetta Posner, MD;  Location: [facility] OR;  Service: Vascular;  Laterality: Bilateral;    History   Social History  . Marital Status: Married    Spouse Name: N/A  . Number of Children: N/A  . Years of Education: N/A   Occupational History  . Not on file.   Social History Main Topics  . Smoking status: Former Smoker    Types: Cigarettes  . Smokeless tobacco: Never Used     Comment: quit smoking at least 36yr  ago  . Alcohol Use: No  . Drug Use: No  . Sexual Activity: Not Currently   Other Topics Concern  . Not on file   Social History Narrative     PHYSICAL EXAMINATION:  Filed Vitals:   07/29/14 0800  BP: 101/52  Pulse: 98  Temp: 97.7 F (36.5 C)  Resp: 16      Intake/Output Summary (Last 24 hours) at 07/29/14 0934 Last data filed at 07/29/14 0908  Gross per 24 hour  Intake   1834 ml  Output    200 ml  Net   1634 ml     GENERAL:alert, no distress and comfortable SKIN: skin color, texture, turgor are normal, no rashes or significant lesions EYES: normal, conjunctiva are pink and  non-injected, sclera clear OROPHARYNX:no exudate, no erythema and lips, buccal mucosa, and tongue normal  NECK: supple, thyroid normal size, non-tender, right neck swelling much improved. LYMPH:  no palpable lymphadenopathy in the cervical, axillary or inguinal LUNGS:decreased breath sounds on the right, clear left lung. HEART: regular rate & rhythm and no murmurs and no lower extremity edema ABDOMEN:abdomen soft, non-tender and normal bowel sounds Musculoskeletal atrophic musculature noted. PSYCH: alert & oriented x 3 with fluent speech NEURO: right sided weakness,   LABORATORY/RADIOLOGY DATA:   Recent Labs Lab 07/06/2014 1340 07/29/14 0350  WBC 9.2 7.7  HGB 6.3* 8.6*  HCT 18.1* 25.0*  PLT 45* 51*  MCV 91.0 88.7  MCH 31.7 30.5  MCHC 34.8 34.4  RDW 16.4* 15.9*  LYMPHSABS 0.3*  --   MONOABS 0.5  --   EOSABS 0.3  --   BASOSABS 0.0  --     CMP    Recent Labs Lab 07/01/2014 1340 07/29/14 0350  NA 124* 125*  K 4.2 4.1  CL 96* 99*  CO2 21* 18*  GLUCOSE 77 80  BUN 37* 42*  CREATININE 0.55* 0.78  CALCIUM 7.4* 7.2*  AST 17  --   ALT 24  --   ALKPHOS 51  --   BILITOT 0.7  --         Component Value Date/Time   BILITOT 0.7 07/10/2014 1340   BILIDIR 0.1 05/03/2007 1208      Urinalysis    Component Value Date/Time   COLORURINE YELLOW 07/16/2014 1126   APPEARANCEUR CLEAR 07/16/2014 1126   LABSPEC 1.029 07/16/2014 1126   PHURINE 6.0 07/16/2014 [place] 07/16/2014 1126   [place] 07/16/2014 1126   [place] 07/16/2014 1126   [place] 07/16/2014 1126   PROTEINUR 30* 07/16/2014 1126   UROBILINOGEN 0.2 07/16/2014 1126   NITRITE NEGATIVE 07/16/2014 1126   LEUKOCYTESUR NEGATIVE 07/16/2014 1126    Drugs of Abuse  No results found for: LABOPIA, COCAINSCRNUR, LABBENZ, AMPHETMU, THCU, LABBARB   Liver Function Tests:  Recent Labs Lab 07/10/2014 1340  AST 17  ALT 24  ALKPHOS 51  BILITOT 0.7  PROT 4.0*  ALBUMIN 1.9*     Radiology Studies:   Ct Angio Chest Pe W/cm &/or Wo Cm  07/02/2014   CLINICAL DATA:  Increased cough and shortness of breath for 2 days. Low blood pressure. History of lung carcinoma with last chemotherapy 1 week ago.  EXAM: CT ANGIOGRAPHY CHEST WITH CONTRAST  TECHNIQUE: Multidetector CT imaging of the chest was performed using the standard protocol during bolus administration of intravenous contrast. Multiplanar CT image reconstructions and MIPs were obtained to evaluate the vascular anatomy.  CONTRAST:  167m OMNIPAQUE IOHEXOL 350 MG/ML SOLN  COMPARISON:  Chest CT, 07/03/2014.  FINDINGS: Angiographic study: Study degraded by motion somewhat limiting assessment for small segmental and subsegmental pulmonary emboli. Allowing for this, there is no evidence a pulmonary embolus. There is diffuse dilation of the ascending and arch portion of the thoracic aorta. Ascending is dilated the most, measuring 4.5 cm x 4.4 cm transversely. There are atherosclerotic calcifications along the aortic arch and at the origins of its branch vessels. Atherosclerotic changes noted along the descending thoracic aorta. Pulmonary arteries are normal in caliber.  Thoracic inlet: Thyroid gland is low in attenuation with an enhancing margin. No discrete nodule. Ill-defined small nodular opacities are noted at the neck base on both sides consistent with shotty adenopathy. None of these nodes are larger than 1 cm short axis. These findings mildly improved from the prior CT.  Mediastinum and hila: Heart is normal in size. There are mild coronary artery calcifications. Mild ill-defined soft tissue is noted along the superior mediastinum consistent with sub cm shotty lymph nodes. There are no pathologically enlarged lymph nodes in no discrete mass. There is no discrete hilar mass or pathologically enlarged lymph node.  Lungs and pleura: Small to moderate left and moderate to large right pleural effusions. There is extensive consolidation,  with both coarsely thickened interstitial markings and patchy dense airspace consolidation, throughout much of the right upper lobe. There is some low attenuation material in the right lower lobe segmental bronchi likely mucus. The right lower lobe is collapsed. There is minor dependent subsegmental atelectasis in the right middle lobe. Some fluid extends along the minor fissure. Most of the left lower lobe is also collapse with a portion of its anterior aspect aerated. There is some mild reticular opacity centrally in the left upper lobe and a small amount of fluid along the oblique fissure. There is no pneumothorax. There is no pulmonary edema. The combination of the pleural effusions and atelectasis leads to mild shift of mediastinal structures and heart to the left.  Limited upper abdomen:  Unremarkable.  Musculoskeletal: There lytic lesions in the right posterior lateral fourth and seventh ribs. There is a lytic lesion with a subtle pathologic fracture, nondisplaced, in the sternal manubrium. This is along its right aspect. No other bone lesions.  Review of the MIP images confirms the above findings.  IMPRESSION: 1. No evidence of a pulmonary embolus. 2. Coarse reticular and dense airspace consolidation in much of the right upper lobe. Pneumonia suspected. However, this could reflect noninfectious inflammation related to treatment or even pulmonary hemorrhage. Smaller similar area of opacity is noted in the left upper lobe. 3. Moderate to large right and small to moderate left pleural effusions. There is complete atelectasis of the right lower lobe and significant atelectasis in the left lower lobe. No pulmonary edema. 4. Neck base adenopathy has mildly improved from prior exam. 5. Lytic lesions in the right sternal manubrium and the right fourth and seventh ribs similar to the prior study.   Electronically Signed   By: Lajean Manes M.D.   On: 07/10/2014 17:07   Dg Chest Port 1 View  07/29/2014   CLINICAL  DATA:  Follow up right pleural effusion pneumonia.  EXAM: PORTABLE CHEST - 1 VIEW  COMPARISON:  Radiographs and CT 07/27/2014.  FINDINGS: 0504 hr. The heart size and mediastinal contours are stable with aortic atherosclerosis. There are lower lung volumes. Allowing for this, there has been no significant change in the bilateral pleural effusions and associated bibasilar pulmonary opacities. Cavitary right upper  lobe consolidation also appears unchanged. There is no evidence of pneumothorax. The bones appear stable.  IMPRESSION: No significant change in pleural effusions and right upper lobe pneumonia.   Electronically Signed   By: Richardean Sale M.D.   On: 07/29/2014 07:25   Dg Chest Port 1 View  07/23/2014   CLINICAL DATA:  Status post right thoracentesis  EXAM: PORTABLE CHEST - 1 VIEW  COMPARISON:  CT chest dated 07/01/2014  FINDINGS: No pneumothorax status post right thoracentesis.  Right upper lobe opacity, suspicious for pneumonia.  Layering bilateral pleural effusions.  The heart is normal in size.  IMPRESSION: No pneumothorax status post right thoracentesis.  Right upper lobe opacity, suspicious for pneumonia.  Layering bilateral pleural effusions.   Electronically Signed   By: Julian Hy M.D.   On: 07/24/2014 19:08     ASSESSMENT AND PLAN:  Metastatic Adenicarcinoma S/p radiation (last treatment on 5/25) Dr. Marin Olp to see patient with further plans  Prognosis to be discussed  Acute hypoxic respiratory failure Likely secondary to HCAP, associated with hemoptysis and right pleural effusion CT chest May 30 confirmed this Right upper lobe consolidation with local interstitial infiltration significantly increased from previous, large right-sided pleural effusion, moderate-sized left-sided pleural effusion, improved neck lymphadenopathy compared to 5/5 study.  He underwent Thoracentesis yielding 1500 mL of very bloody fluid removed. 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 This clearly is a very tenuous situation.  It is obvious that his malignancy is really affecting him. He had the 1500 mL of fluid removed. He's having some hemoptysis.  I not sure why he is so anemic and thrombocytopenic. I looked at his blood smear. I do not see any nucleated red cells. There were no immature white cells. I saw no evidence of obvious marrow involvement by cancer. I looked at his med list and I do not see anything obvious that was leading to the thrombocytopenia. He is on some steroids. Possibly just the radiation that he took could have been the source. I clearly would transfuse with platelets.  His genetic analysis came back. He has no actionable mutations that we can use an oral drug.. Surprisingly enough, he is BRCA 1 positive.  Our window of opportunity is closing on him to try to help. I think the way we are going to be able to help him is with systemic chemotherapy.  I had still have to believe that this is metastatic lung cancer. The whole clinical picture fits for lung cancer. As such, I would consider using carboplatinum/Alimta.  I talked to him at length about this. I told him that the chance  of helping him with chemotherapy is probably 25% or  less. He knows that we cannot cure this.  I will have to dose reduce the chemotherapy because of his performance status.  I think he will need a PICC line so we can give the chemotherapy.  I thought about the possibility of him having a bone marrow biopsy. This can certainly tell us and show [place] if he has bone marrow involvement. Again looking at his blood smear, I really do not see anything on the blood smear that showed obvious changes that one would see with bone marrow invasion. However, I think it will be important for [place] to know what is going on.  He also is asking about a second opinion at [facility]. I certainly have no problems with this. I told him that [place] care system probably would not provide transportation for him. I don't know if a case manager could help with this or not.  Again, we are at a critical point now that I think if we are going to try to help him, now is the time to do it.  It is apparent that he just has a very aggressive malignancy. I thought that given the fact that he is not smoking for long time and that he is Asian, we might be oh to have a mutation that we can use an oral drug.Marland Kitchen He does have some genetic abnormalities in which there are oral drugs but they're only approved for other malignancies so insurance would not cover these for his cancer.  I appreciate all the outstanding care that he is getting. We will certainly help as much as we can.  Hopefully, we can consider chemotherapy to start tomorrow. I  I think we have to do inpatient chemotherapy because of these pleural effusions that he keeps getting.  Lum Keas  1 Peter 4:16

## 2014-07-29 NOTE — Progress Notes (Signed)
Initial assessment on patient noted O2 sats 84% on 6L O2 via [place].  Pt encouraged to cough phlegm he is holding in back of throat.  Suction at bedside initiated.  Pt and son educated on use of [place]. RN assisted patient in use and encouraged TCDB exercises and suction of hymopotisis. O2 sats gradually on the rise. Notified CCM MD covering of decreased sats and subsequent improvements with intervention. Pt now at 90% O2 sat on 6L O2 via [place]. Plan to keep sats >86% per covering MD. Will continue to monitor patient status.  ~ Petra Kuba, RN

## 2014-07-29 NOTE — Progress Notes (Signed)
PULMONARY / CRITICAL CARE MEDICINE   Name: [name] [name] MRN: [id number] DOB: [birth year]    ADMISSION DATE:  07/01/2014 CONSULTATION DATE:  07/08/2014  REFERRING MD :  Emergency room physician  CHIEF COMPLAINT:  Shortness of breath, hemoptysis  INITIAL PRESENTATION: 74 year old male with newly diagnosed metastatic adenocarcinoma of uncertain origin was admitted through the [facility] emergency department on 07/10/2014 for acute hypoxemic respiratory failure, hemoptysis, and a large right pleural effusion.  STUDIES:  5/30  CT Chest >> Right upper lobe consolidation with local interstitial infiltration significantly increased from previous, large right-sided pleural effusion, moderate-sized left-sided pleural effusion, improved neck lymphadenopathy compared to May 5 study  SIGNIFICANT EVENTS: 5/30  Thoracentesis >> 1500 mL of very bloody fluid removed  SUBJECTIVE: afebrile Dyspnea improved post thora  no chest pain C/o hemoptysis   VITAL SIGNS: Temp:  [97.7 F (36.5 C)-99.3 F (37.4 C)] 97.7 F (36.5 C) (05/31 0800) Pulse Rate:  [46-104] 98 (05/31 0800) Resp:  [14-26] 16 (05/31 0800) BP: (70-113)/(35-78) 101/52 mmHg (05/31 0800) SpO2:  [83 %-96 %] 93 % (05/31 0800) Weight:  [133 lb 6.1 oz (60.5 kg)-135 lb 12.9 oz (61.6 kg)] 135 lb 12.9 oz (61.6 kg) (05/31 0500)   HEMODYNAMICS:     VENTILATOR SETTINGS:     INTAKE / OUTPUT:  Intake/Output Summary (Last 24 hours) at 07/29/14 0930 Last data filed at 07/29/14 0908  Gross per 24 hour  Intake   1834 ml  Output    200 ml  Net   1634 ml    PHYSICAL EXAMINATION: General:  Profoundly weak and pale appearing Asian male lying in bed  Neuro:  Awake and alert, interactive but quite weak, right leg neuro exam is notable for weakness in dorsiflexion of the foot 3/5, extension of the hip 3/5, left leg strength is 5 out of 5 HEENT:  No trauma or blue bruising, oropharynx clear, no stigmata of bleeding, neck masses significantly  improved since my last exam Cardiovascular:  Regular rate and rhythm no systolic murmur Lungs:  Diminished breath sounds throughout the right, some crackles, significant rhonchi bilaterally Abdomen:  Soft, nontender Musculoskeletal:  Significant atrophy of the right leg musculature throughout Skin:  Bruising over the right chest wall adjacent to radiation marking tattoos  LABS:  CBC  Recent Labs Lab 07/07/2014 1340 07/29/14 0350  WBC 9.2 7.7  HGB 6.3* 8.6*  HCT 18.1* 25.0*  PLT 45* 51*   Coag's No results for input(s): APTT, INR in the last 168 hours.   BMET  Recent Labs Lab 07/04/2014 1340 07/29/14 0350  NA 124* 125*  K 4.2 4.1  CL 96* 99*  CO2 21* 18*  BUN 37* 42*  CREATININE 0.55* 0.78  GLUCOSE 77 80   Electrolytes  Recent Labs Lab 07/25/2014 1340 07/29/14 0350  CALCIUM 7.4* 7.2*   Sepsis Markers No results for input(s): LATICACIDVEN, PROCALCITON, O2SATVEN in the last 168 hours.   ABG No results for input(s): PHART, PCO2ART, PO2ART in the last 168 hours.   Liver Enzymes  Recent Labs Lab 07/27/2014 1340  AST 17  ALT 24  ALKPHOS 51  BILITOT 0.7  ALBUMIN 1.9*   Cardiac Enzymes No results for input(s): TROPONINI, PROBNP in the last 168 hours.   Glucose No results for input(s): GLUCAP in the last 168 hours.  Imaging Ct Angio Chest Pe W/cm &/or Wo Cm  07/04/2014   CLINICAL DATA:  Increased cough and shortness of breath for 2 days. Low blood pressure. History of lung  carcinoma with last chemotherapy 1 week ago.  EXAM: CT ANGIOGRAPHY CHEST WITH CONTRAST  TECHNIQUE: Multidetector CT imaging of the chest was performed using the standard protocol during bolus administration of intravenous contrast. Multiplanar CT image reconstructions and MIPs were obtained to evaluate the vascular anatomy.  CONTRAST:  18m OMNIPAQUE IOHEXOL 350 MG/ML SOLN  COMPARISON:  Chest CT, 07/03/2014.  FINDINGS: Angiographic study: Study degraded by motion somewhat limiting assessment for  small segmental and subsegmental pulmonary emboli. Allowing for this, there is no evidence a pulmonary embolus. There is diffuse dilation of the ascending and arch portion of the thoracic aorta. Ascending is dilated the most, measuring 4.5 cm x 4.4 cm transversely. There are atherosclerotic calcifications along the aortic arch and at the origins of its branch vessels. Atherosclerotic changes noted along the descending thoracic aorta. Pulmonary arteries are normal in caliber.  Thoracic inlet: Thyroid gland is low in attenuation with an enhancing margin. No discrete nodule. Ill-defined small nodular opacities are noted at the neck base on both sides consistent with shotty adenopathy. None of these nodes are larger than 1 cm short axis. These findings mildly improved from the prior CT.  Mediastinum and hila: Heart is normal in size. There are mild coronary artery calcifications. Mild ill-defined soft tissue is noted along the superior mediastinum consistent with sub cm shotty lymph nodes. There are no pathologically enlarged lymph nodes in no discrete mass. There is no discrete hilar mass or pathologically enlarged lymph node.  Lungs and pleura: Small to moderate left and moderate to large right pleural effusions. There is extensive consolidation, with both coarsely thickened interstitial markings and patchy dense airspace consolidation, throughout much of the right upper lobe. There is some low attenuation material in the right lower lobe segmental bronchi likely mucus. The right lower lobe is collapsed. There is minor dependent subsegmental atelectasis in the right middle lobe. Some fluid extends along the minor fissure. Most of the left lower lobe is also collapse with a portion of its anterior aspect aerated. There is some mild reticular opacity centrally in the left upper lobe and a small amount of fluid along the oblique fissure. There is no pneumothorax. There is no pulmonary edema. The combination of the  pleural effusions and atelectasis leads to mild shift of mediastinal structures and heart to the left.  Limited upper abdomen:  Unremarkable.  Musculoskeletal: There lytic lesions in the right posterior lateral fourth and seventh ribs. There is a lytic lesion with a subtle pathologic fracture, nondisplaced, in the sternal manubrium. This is along its right aspect. No other bone lesions.  Review of the MIP images confirms the above findings.  IMPRESSION: 1. No evidence of a pulmonary embolus. 2. Coarse reticular and dense airspace consolidation in much of the right upper lobe. Pneumonia suspected. However, this could reflect noninfectious inflammation related to treatment or even pulmonary hemorrhage. Smaller similar area of opacity is noted in the left upper lobe. 3. Moderate to large right and small to moderate left pleural effusions. There is complete atelectasis of the right lower lobe and significant atelectasis in the left lower lobe. No pulmonary edema. 4. Neck base adenopathy has mildly improved from prior exam. 5. Lytic lesions in the right sternal manubrium and the right fourth and seventh ribs similar to the prior study.   Electronically Signed   By: DLajean ManesM.D.   On: 07/01/2014 17:07   Dg Chest Port 1 View  07/29/2014   CLINICAL DATA:  Follow up right pleural effusion  pneumonia.  EXAM: PORTABLE CHEST - 1 VIEW  COMPARISON:  Radiographs and CT 07/24/2014.  FINDINGS: 0504 hr. The heart size and mediastinal contours are stable with aortic atherosclerosis. There are lower lung volumes. Allowing for this, there has been no significant change in the bilateral pleural effusions and associated bibasilar pulmonary opacities. Cavitary right upper lobe consolidation also appears unchanged. There is no evidence of pneumothorax. The bones appear stable.  IMPRESSION: No significant change in pleural effusions and right upper lobe pneumonia.   Electronically Signed   By: Richardean Sale M.D.   On: 07/29/2014  07:25   Dg Chest Port 1 View  07/21/2014   CLINICAL DATA:  Status post right thoracentesis  EXAM: PORTABLE CHEST - 1 VIEW  COMPARISON:  CT chest dated 07/27/2014  FINDINGS: No pneumothorax status post right thoracentesis.  Right upper lobe opacity, suspicious for pneumonia.  Layering bilateral pleural effusions.  The heart is normal in size.  IMPRESSION: No pneumothorax status post right thoracentesis.  Right upper lobe opacity, suspicious for pneumonia.  Layering bilateral pleural effusions.   Electronically Signed   By: Julian Hy M.D.   On: 07/18/2014 19:08     ASSESSMENT / PLAN:  PULMONARY  A:  1.  Acute Hypoxemic Respiratory Failure - secondary to possible healthcare associated pneumonia, associated with hemoptysis and a large right-sided pleural effusion.  Radiology feels that the right upper lobe consolidation seen on the CT chest is consistent with pneumonia, however I'm very concerned that this is actually a malignancy considering the clinical context.  2. Hemoptysis - secondary to thrombocytopenia pneumonia versus mass > not massive  3. Exudative Pleural Effusion - Very bloody, 1500 ml pleural fluid removed 5/30, this is most likely due to pleural metastasis bleeding in the setting of thrombocytopenia  P:   Follow fluid for cytology and culture analysis Oxygen as needed for O2 saturation greater than 92% Treat for healthcare associated pneumonia,see ID    CARDIOVASCULAR A:  Sinus tachycardia secondary to bleeding P:  Telemetry monitoring Hold blood pressure medications for now Hold blood pressure meds for now Gentle volume resuscitation  RENAL A:   Hyponatremia ? Of malignancy P:   Monitor BMET and UOP Replace electrolytes as needed   GASTROINTESTINAL A:   Heme positive stool but clinically not bleeding from GI tract P:   Monitor for bleeding Regular diet  HEMATOLOGIC A:   Hemorrhagic anemia Thrombocytopenia likely secondary to bleeding and/or  radiation treatment DVT left subclavian vein Adenocarcinoma of uncertain etiology, EGFR pending 5/13 P:  Transfuse PRBC for Hb < 7 Transfuse platelets for 50 k or lower No anticoagulation considering bleeding and thrombocytopenia  Dr.Ennever to see  INFECTIOUS A:  Healthcare associated pneumonia P:   BCx2 will not collect, minimize phlebotomy Vancomycin May 30> Zosyn May 30>   ENDOCRINE A:   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 Marin Olp  Summary - Hemoptysis due to ? Malignancy spread vs HCAP EGFR pending & may change prognosis - otherwise quite grim here If rt effusion gets worse, can consider pleurx Change to SD status  FAMILY  - Updates: Talked to the patient's son at length on 05/30 & 5/31. father's overall prognosis appears very poor considering his malnourished state, profound weakness, right  leg neurologic weakness and atrophy, metastatic malignancy, and acute illness. They prefer to maintain his CODE STATUS as FULL CODE at this time.   The patient is critically ill with multiple organ systems failure and requires high complexity decision making for assessment and support, frequent evaluation and titration of therapies, application of advanced monitoring technologies and extensive interpretation of multiple databases. Critical Care Time devoted to patient care services described in this note independent of APP time is 35 minutes.   Kara Mead MD. [facility] Flood. [facility] Pager [phone] If no response call [phone]     07/29/2014, 9:30 AM

## 2014-07-29 NOTE — Progress Notes (Signed)
Spoke to patient and his daughter in law regarding a scheduled meeting for [place] discussion.   Son is requested to call me with a convenient time to meet all together.  Will await call back.  Wadie Lessen NP

## 2014-07-29 NOTE — Care Management Note (Signed)
Case Management Note  Patient Details  Name: [name] [name] MRN: [id number] Date of Birth: [birth year]  Subjective/Objective:                  CHIEF COMPLAINT: Shortness of breath, hemoptysis  Action/Plan:  Discharge planning Expected Discharge Date:   (UNKNOWN)               Expected Discharge Plan:     In-House Referral:     Discharge planning Services  CM Consult  Post Acute Care Choice:    Choice offered to:     DME Arranged:    DME Agency:     HH Arranged:    HH Agency:     Status of Service:  In process, will continue to follow  Medicare Important Message Given:    Date Medicare IM Given:    Medicare IM give by:    Date Additional Medicare IM Given:    Additional Medicare Important Message give by:     If discussed at [place] of Stay Meetings, dates discussed:    Additional Comments: CM notes pending GOC; will monitor for discharge needs. Dellie Catholic, RN 07/29/2014, 2:19 PM

## 2014-07-30 ENCOUNTER — Inpatient Hospital Stay (HOSPITAL_COMMUNITY): Payer: BC Managed Care – PPO

## 2014-07-30 ENCOUNTER — Encounter (HOSPITAL_COMMUNITY): Payer: Self-pay | Admitting: Internal Medicine

## 2014-07-30 DIAGNOSIS — E440 Moderate protein-calorie malnutrition: Secondary | ICD-10-CM | POA: Insufficient documentation

## 2014-07-30 DIAGNOSIS — G893 Neoplasm related pain (acute) (chronic): Secondary | ICD-10-CM

## 2014-07-30 DIAGNOSIS — D696 Thrombocytopenia, unspecified: Secondary | ICD-10-CM | POA: Insufficient documentation

## 2014-07-30 DIAGNOSIS — D500 Iron deficiency anemia secondary to blood loss (chronic): Secondary | ICD-10-CM

## 2014-07-30 DIAGNOSIS — J189 Pneumonia, unspecified organism: Secondary | ICD-10-CM

## 2014-07-30 DIAGNOSIS — D630 Anemia in neoplastic disease: Secondary | ICD-10-CM

## 2014-07-30 DIAGNOSIS — D6959 Other secondary thrombocytopenia: Secondary | ICD-10-CM

## 2014-07-30 LAB — FIBRINOGEN: Fibrinogen: 159 mg/dL — ABNORMAL LOW (ref 204–475)

## 2014-07-30 LAB — BLOOD GAS, ARTERIAL
ACID-BASE DEFICIT: 7 mmol/L — AB (ref 0.0–2.0)
Bicarbonate: 18.3 mEq/L — ABNORMAL LOW (ref 20.0–24.0)
DRAWN BY: 257701
FIO2: 0.55 %
O2 Saturation: 75.4 %
PO2 ART: 44.1 mmHg — AB (ref 80.0–100.0)
Patient temperature: 37
TCO2: 17.6 mmol/L (ref 0–100)
pCO2 arterial: 38.3 mmHg (ref 35.0–45.0)
pH, Arterial: 7.301 — ABNORMAL LOW (ref 7.350–7.450)

## 2014-07-30 LAB — URINALYSIS, ROUTINE W REFLEX MICROSCOPIC
Bilirubin Urine: NEGATIVE
Glucose, UA: NEGATIVE mg/dL
Ketones, ur: NEGATIVE mg/dL
Leukocytes, UA: NEGATIVE
Nitrite: NEGATIVE
Protein, ur: NEGATIVE mg/dL
SPECIFIC GRAVITY, URINE: 1.028 (ref 1.005–1.030)
Urobilinogen, UA: 1 mg/dL (ref 0.0–1.0)
pH: 5 (ref 5.0–8.0)

## 2014-07-30 LAB — VANCOMYCIN, TROUGH: Vancomycin Tr: 19 ug/mL (ref 10.0–20.0)

## 2014-07-30 LAB — TECHNOLOGIST SMEAR REVIEW: Tech Review: NONE SEEN

## 2014-07-30 LAB — PROTIME-INR
INR: 1.26 (ref 0.00–1.49)
Prothrombin Time: 15.9 seconds — ABNORMAL HIGH (ref 11.6–15.2)

## 2014-07-30 LAB — URINE MICROSCOPIC-ADD ON

## 2014-07-30 LAB — BASIC METABOLIC PANEL
Anion gap: 9 (ref 5–15)
BUN: 52 mg/dL — ABNORMAL HIGH (ref 6–20)
CHLORIDE: 103 mmol/L (ref 101–111)
CO2: 20 mmol/L — ABNORMAL LOW (ref 22–32)
CREATININE: 0.65 mg/dL (ref 0.61–1.24)
Calcium: 7.4 mg/dL — ABNORMAL LOW (ref 8.9–10.3)
GLUCOSE: 126 mg/dL — AB (ref 65–99)
Potassium: 3.8 mmol/L (ref 3.5–5.1)
SODIUM: 132 mmol/L — AB (ref 135–145)

## 2014-07-30 LAB — HEMOGLOBIN AND HEMATOCRIT, BLOOD
HCT: 22.6 % — ABNORMAL LOW (ref 39.0–52.0)
HEMOGLOBIN: 7.9 g/dL — AB (ref 13.0–17.0)

## 2014-07-30 LAB — APTT: aPTT: 31 seconds (ref 24–37)

## 2014-07-30 LAB — PREPARE RBC (CROSSMATCH)

## 2014-07-30 LAB — T4, FREE: Free T4: 0.39 ng/dL — ABNORMAL LOW (ref 0.61–1.12)

## 2014-07-30 LAB — PREALBUMIN: PREALBUMIN: 11.7 mg/dL — AB (ref 18–38)

## 2014-07-30 MED ORDER — SODIUM CHLORIDE 0.9 % IJ SOLN
10.0000 mL | Freq: Two times a day (BID) | INTRAMUSCULAR | Status: DC
  Administered 2014-07-30: 20 mL
  Administered 2014-07-30 – 2014-08-01 (×5): 10 mL

## 2014-07-30 MED ORDER — IPRATROPIUM-ALBUTEROL 0.5-2.5 (3) MG/3ML IN SOLN
3.0000 mL | RESPIRATORY_TRACT | Status: DC
  Administered 2014-07-30 – 2014-07-31 (×7): 3 mL via RESPIRATORY_TRACT
  Filled 2014-07-30 (×8): qty 3

## 2014-07-30 MED ORDER — ENSURE ENLIVE PO LIQD
237.0000 mL | Freq: Two times a day (BID) | ORAL | Status: DC
  Administered 2014-07-31: 237 mL via ORAL

## 2014-07-30 MED ORDER — VANCOMYCIN HCL IN DEXTROSE 1-5 GM/200ML-% IV SOLN
1000.0000 mg | Freq: Two times a day (BID) | INTRAVENOUS | Status: DC
  Administered 2014-07-30 – 2014-08-01 (×5): 1000 mg via INTRAVENOUS
  Filled 2014-07-30 (×4): qty 200

## 2014-07-30 MED ORDER — CYANOCOBALAMIN 1000 MCG/ML IJ SOLN
1000.0000 ug | Freq: Once | INTRAMUSCULAR | Status: AC
  Administered 2014-07-30: 1000 ug via INTRAMUSCULAR
  Filled 2014-07-30: qty 1

## 2014-07-30 MED ORDER — BIOTENE DRY MOUTH MT LIQD
15.0000 mL | Freq: Every day | OROMUCOSAL | Status: DC
  Administered 2014-07-30 – 2014-07-31 (×8): 15 mL via OROMUCOSAL

## 2014-07-30 MED ORDER — SODIUM CHLORIDE 0.9 % IJ SOLN: 10.0000 mL | INTRAMUSCULAR | Status: DC | PRN

## 2014-07-30 MED ORDER — SODIUM CHLORIDE 0.9 % IV SOLN
Freq: Once | INTRAVENOUS | Status: AC
  Administered 2014-07-30: 10 mL/h via INTRAVENOUS

## 2014-07-30 MED ORDER — METRONIDAZOLE IN NACL 5-0.79 MG/ML-% IV SOLN
500.0000 mg | Freq: Three times a day (TID) | INTRAVENOUS | Status: DC
  Administered 2014-07-30 – 2014-08-02 (×9): 500 mg via INTRAVENOUS
  Filled 2014-07-30 (×9): qty 100

## 2014-07-30 MED ORDER — SODIUM CHLORIDE 0.9 % IV SOLN: Freq: Once | INTRAVENOUS | Status: DC

## 2014-07-30 MED ORDER — SODIUM CHLORIDE 0.9 % IV SOLN
Freq: Once | INTRAVENOUS | Status: AC
  Administered 2014-07-30: 06:00:00 via INTRAVENOUS

## 2014-07-30 MED ORDER — METHYLPREDNISOLONE SODIUM SUCC 125 MG IJ SOLR
60.0000 mg | Freq: Four times a day (QID) | INTRAMUSCULAR | Status: DC
  Administered 2014-07-30 – 2014-08-02 (×11): 60 mg via INTRAVENOUS
  Filled 2014-07-30 (×11): qty 2

## 2014-07-30 MED ORDER — METHYLPREDNISOLONE SODIUM SUCC 125 MG IJ SOLR
125.0000 mg | Freq: Once | INTRAMUSCULAR | Status: AC
  Administered 2014-07-30: 125 mg via INTRAVENOUS
  Filled 2014-07-30: qty 2

## 2014-07-30 MED ORDER — MORPHINE INJECTION FOR INHALATION 10 MG/ML
10.0000 mg | RESPIRATORY_TRACT | Status: DC | PRN
Start: 2014-07-30 — End: 2014-08-03

## 2014-07-30 MED ORDER — DEXTROSE 5 % IV SOLN
1.0000 g | Freq: Three times a day (TID) | INTRAVENOUS | Status: DC
  Administered 2014-07-30 – 2014-08-02 (×9): 1 g via INTRAVENOUS
  Filled 2014-07-30 (×10): qty 1

## 2014-07-30 NOTE — Progress Notes (Signed)
Peripherally Inserted Central Catheter/Midline Placement  The IV Nurse has discussed with the patient and/or persons authorized to consent for the patient, the purpose of this procedure and the potential benefits and risks involved with this procedure.  The benefits include less needle sticks, lab draws from the catheter and patient may be discharged home with the catheter.  Risks include, but not limited to, infection, bleeding, blood clot (thrombus formation), and puncture of an artery; nerve damage and irregular heat beat.  Alternatives to this procedure were also discussed.  PICC/Midline Placement Documentation  PICC / Midline Double Lumen 25/95/63 PICC Right Basilic 38 cm 2 cm (Active)  Indication for Insertion or Continuance of Line Poor Vasculature-patient has had multiple peripheral attempts or PIVs lasting less than 24 hours 07/30/2014 12:00 PM  Exposed Catheter (cm) 2 cm 07/30/2014 12:00 PM  Dressing Change Due 08/06/14 07/30/2014 12:00 PM       Jule Economy Horton 07/30/2014, 12:25 PM

## 2014-07-30 NOTE — Progress Notes (Signed)
Consultation Note Date: 07/30/2014   Patient Name: [name] [name]  DOB: [birth year]  MRN: [id number]  Age / Sex: 74 y.o., male   PCP: [name], MD Referring Physician: Janece Canterbury, MD  Reason for Consultation: Establishing goals of care and Psychosocial/spiritual support  Palliative Care Assessment and Plan Summary of Established Goals of Care and Medical Treatment Preferences    Palliative Care Discussion Held Today:  Addressed cultural concerns prior to any discussion.  Son tells me he "wants to know the truth", but hopes that communication with his father be presented in a "positive, hopeful " manner.  He voices concerns that his father is receiving information in a way that "takes away hope and is making him sicker", this is upsetting to the family.  Son was very clear that patient is open to all offered and available medical interventions and treatments to prolong life.     This NP Wadie Lessen reviewed medical records, received report from team, meet the patient briefly and then spoke with his son and his wife to discuss diagnosis, prognosis, GOC,  and options. We discussed anticipatory care needs and realistic community care options.  Son verbalizes feeling that his fatherr was discharged too early on last admission and this is why he is doing poorly now.    Further   discussion was had  regarding advanced directives.  Concepts specific to code status was had.  The difference between a aggressive medical intervention path  and a palliative comfort care path for this patient at this time was had.  We discussed  The limitations of medical intervention and the reality of each persons mortality.  Values and goals of care important to patient and family were attempted to be elicited.  Concept of  Palliative Care was discussed  Questions and concerns addressed.  Family encouraged to call with questions or concerns.  PMT will continue to support holistically and help family navigate health  care decisions and options  Family assured that today's conversation would be shared with attending and Dr Marin Olp.  Family plan to meet Dr Marin Olp tomorrow morning.    Contacts/Participants in Discussion:  Spoke with patient briefly at bedside and then spoke with son and his wife outside room.    Primary Decision Maker: Patient along with support of his son and family   Code Status/Advance Care Planning:  Full code   Psycho-social/Spiritual:   Support System: Family, one son and his wife( live local), wife is currently in [place] visiting, family are attempting to contact her to update on condition.  Desire for further Chaplaincy support:no.  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 He was found to have acute hypoxic respiratory failure. Chest x ray showed a large right pleural effusion. CT chest May 30 confirmed this Right upper lobe consolidation with local interstitial infiltration significantly increased from previous, large right-sided pleural effusion, moderate-sized left-sided pleural effusion, improved neck lymphadenopathy compared to 5/5 study. He underwent Thoracentesis yielding 1500 mL of very bloody fluid removed, in setting of thrombocytopenia. Overall poor prognosis.  Oncology involved and will start IP treatment  Patient faced with treatment  option decisions and advanced directive decisions and anticipatory care needs. This is a difficult situation to accept having been fully functioning and healthy 2 months ago.  Primary Diagnoses  Present on  Admission:  . Hemoptysis  Palliative Review of Systems: - weakness, increased WOB on minimal  exertion I have reviewed the medical record, interviewed the patient and family, and examined the patient. The following aspects are pertinent.  Past Medical History  Diagnosis Date  . Lung nodule 2012  . Insomnia     takes Restoril nightly as needed  . Hypertension     takes Benazepril and Metoprolol daily  . Pneumonia     hx of;many yrs ago  . AAA (abdominal aortic aneurysm)   . Joint pain   . Joint swelling   . Urinary frequency     takes Terazosin daily  . Urinary urgency   . Nocturia   . Enlarged prostate     slightly  . Rheumatoid arthritis(714.0)     takes Plaquenil daily and Methotrexate weekly   History   Social History  . Marital Status: Married    Spouse Name: N/A  . Number of Children: N/A  . Years of Education: N/A   Social History Main Topics  . Smoking status: Former Smoker    Types: Cigarettes  . Smokeless tobacco: Never Used     Comment: quit smoking at least 61yr ago  . Alcohol Use: No  . Drug Use: No  . Sexual Activity: Not Currently   Other Topics Concern  . None   Social History Narrative   Family History  Problem Relation Age of Onset  . Hypertension Father   . Stroke Father   . Hypertension Mother   . Diabetes Mother   . Hyperlipidemia Mother    Scheduled Meds: . sodium chloride   Intravenous Once  . sodium chloride   Intravenous Once  . sodium chloride   Intravenous Once  . antiseptic oral rinse  7 mL Mouth Rinse BID  . cyanocobalamin  1,000 mcg Intramuscular Once  . dexamethasone  8 mg Oral Q12H  . docusate sodium  200 mg Oral Daily  . methimazole  60 mg Oral Daily  . OxyCODONE  15 mg Oral Q12H  . pantoprazole  40 mg Oral BID  . piperacillin-tazobactam (ZOSYN)  IV  3.375 g Intravenous Q8H  . sodium chloride  1,000 mL Intravenous Once  . sodium chloride  10-40 mL Intracatheter Q12H  . vancomycin  750 mg Intravenous Q8H    Continuous Infusions: . sodium chloride 50 mL/hr at 07/30/14 1332   PRN Meds:.sodium chloride, acetaminophen, ondansetron (ZOFRAN) IV, oxyCODONE, polyethylene glycol, senna, sodium chloride, traMADol Medications Prior to Admission:  Prior to Admission medications   Medication Sig Start Date End Date Taking? Authorizing Provider  ALPRAZolam (Duanne Moron 0.5 MG tablet Take 1 tablet (0.5 mg total) by mouth every 6 (six) hours as needed for anxiety. 07/18/14  Yes PThurnell Lose MD  benazepril (LOTENSIN) 20 MG tablet Take 20 mg by mouth daily.   Yes Historical Provider, MD  Calcium Carbonate-Vitamin D (CALCIUM-D) 600-400 MG-UNIT TABS Take 1 tablet by mouth daily.   Yes Historical Provider, MD  Cholecalciferol (D 2000) 2000 UNITS TABS Take 1 tablet by mouth daily.    Yes Historical Provider, MD  dexamethasone (DECADRON) 4 MG tablet Take 2 tablets (8 mg total) by mouth every 12 (twelve) hours. 07/18/14  Yes PThurnell Lose MD  docusate sodium (COLACE) 100 MG capsule Take 200 mg  by mouth daily.   Yes Historical Provider, MD  fish oil-omega-3 fatty acids 1000 MG capsule Take 1 g by mouth daily.   Yes Historical Provider, MD  fluconazole (DIFLUCAN) 100 MG tablet Take 100 mg by mouth daily.   Yes Historical Provider, MD  folic acid (FOLVITE) 1 MG tablet Take 1 mg by mouth daily.   Yes Historical Provider, MD  hydrALAZINE (APRESOLINE) 50 MG tablet Take 1 tablet (50 mg total) by mouth 3 (three) times daily. 07/18/14  Yes Thurnell Lose, MD  hydroxychloroquine (PLAQUENIL) 200 MG tablet Take 200 mg by mouth 2 (two) times daily.   Yes Historical Provider, MD  methimazole (TAPAZOLE) 10 MG tablet Take 6 tablets (60 mg total) by mouth daily. 07/18/14  Yes Thurnell Lose, MD  metoprolol succinate (TOPROL-XL) 50 MG 24 hr tablet Take 1 tablet (50 mg total) by mouth daily. 07/18/14  Yes Thurnell Lose, MD  Multiple Vitamin (MULTIVITAMIN) tablet Take 1 tablet by mouth daily.   Yes Historical Provider, MD   Nutritional Supplements (GLUCERNA 1.5 CAL) LIQD One can 3 times a day with meals 07/18/14  Yes Thurnell Lose, MD  ondansetron (ZOFRAN) 4 MG tablet Take 1 tablet (4 mg total) by mouth every 8 (eight) hours as needed for nausea or vomiting. 07/18/14  Yes Thurnell Lose, MD  oxyCODONE (OXY IR/ROXICODONE) 5 MG immediate release tablet Take 1-2 tablets (5-10 mg total) by mouth every 4 (four) hours as needed for moderate pain. 07/18/14  Yes Thurnell Lose, MD  OxyCODONE (OXYCONTIN) 15 mg T12A 12 hr tablet Take 1 tablet (15 mg total) by mouth every 12 (twelve) hours. 07/18/14  Yes Thurnell Lose, MD  pantoprazole (PROTONIX) 40 MG tablet Take 1 tablet (40 mg total) by mouth 2 (two) times daily. 07/18/14  Yes Thurnell Lose, MD  PROMETHAZINE HCL IJ Inject 25 mg into the muscle every 6 (six) hours as needed (nausea and vomiting).   Yes Historical Provider, MD  senna (SENOKOT) 8.6 MG TABS tablet Take 1-2 tablets QHS PRN CONSTIPATION Patient taking differently: Take 1 tablet by mouth every 8 (eight) hours as needed for mild constipation (constipation).  07/21/14  Yes Eppie Gibson, MD  traMADol (ULTRAM) 50 MG tablet Take 1 tablet (50 mg total) by mouth every 6 (six) hours as needed for moderate pain. 12/12/13  Yes Samantha J Rhyne, PA-C  vitamin E (VITAMIN E) 400 UNIT capsule Take 400 Units by mouth 2 (two) times daily.    Yes Historical Provider, MD  chlorproMAZINE (THORAZINE) 25 MG tablet Take 1 tablet (25 mg total) by mouth 4 (four) times daily as needed for hiccoughs. 07/18/14   Thurnell Lose, MD  ipratropium-albuterol (DUONEB) 0.5-2.5 (3) MG/3ML SOLN Take 3 mLs by nebulization every 4 (four) hours as needed. 07/18/14   Thurnell Lose, MD  polyethylene glycol (MIRALAX / GLYCOLAX) packet Take 17 g by mouth daily as needed for mild constipation. 07/18/14   Thurnell Lose, MD   Allergies  Allergen Reactions  . Other     Catfish + IV pain medication (unknown name)-vomits   CBC:    Component Value  Date/Time   WBC 7.0 07/30/2014 0343   HGB 6.3* 07/30/2014 0343   HCT 18.4* 07/30/2014 0343   PLT 32* 07/30/2014 0343   MCV 90.2 07/30/2014 0343   NEUTROABS 8.1* 07/26/2014 1340   LYMPHSABS 0.3* 07/04/2014 1340   MONOABS 0.5 07/08/2014 1340   EOSABS 0.3 07/05/2014 1340   BASOSABS 0.0  07/10/2014 1340   Comprehensive Metabolic Panel:    Component Value Date/Time   NA 132* 07/30/2014 0343   K 3.8 07/30/2014 0343   CL 103 07/30/2014 0343   CO2 20* 07/30/2014 0343   BUN 52* 07/30/2014 0343   CREATININE 0.65 07/30/2014 0343   CREATININE 0.76 11/13/2013 1103   GLUCOSE 126* 07/30/2014 0343   CALCIUM 7.4* 07/30/2014 0343   AST 17 07/05/2014 1340   ALT 24 07/15/2014 1340   ALKPHOS 51 06/29/2014 1340   BILITOT 0.7 07/02/2014 1340   PROT 4.0* 07/09/2014 1340   ALBUMIN 1.9* 07/25/2014 1340    Physical Exam: Vital Signs: BP 125/70 mmHg  Pulse 102  Temp(Src) 98.1 F (36.7 C) (Axillary)  Resp 23  Ht '5\' 7"'$  (1.702 m)  Wt 63.9 kg (140 lb 14 oz)  BMI 22.06 kg/m2  SpO2 89% SpO2: SpO2: (!) 89 % O2 Device: O2 Device: Nasal Cannula O2 Flow Rate: O2 Flow Rate (L/min): 6 L/min Intake/output summary:  Intake/Output Summary (Last 24 hours) at 07/30/14 1353 Last data filed at 07/30/14 1127  Gross per 24 hour  Intake 2973.75 ml  Output    675 ml  Net 2298.75 ml   LBM:   Baseline Weight: Weight: 60.5 kg (133 lb 6.1 oz) Most recent weight: Weight: 63.9 kg (140 lb 14 oz)  Exam Findings:  General: chronically  ill appearing, NAD CVS: tachycardic Resp: RRR Skin: warm and dry         Palliative Performance Scale: 30 % at best              Additional Data Reviewed: Recent Labs     07/29/14  0350  07/30/14  0343  WBC  7.7  7.0  HGB  8.6*  6.3*  PLT  51*  32*  NA  125*  132*  BUN  42*  52*  CREATININE  0.78  0.65     Time In: 1200 Time Out: 1330 Time Total: 90 min  Greater than 50%  of this time was spent counseling and coordinating care related to the above assessment and  plan.  Signed by: Wadie Lessen, NP  Knox Royalty, NP  07/30/2014, 1:53 PM  Please contact Palliative Medicine Team phone at [phone] for questions and concerns.   Discussed with Dr Marin Olp and Dr Sheran Fava

## 2014-07-30 NOTE — Procedures (Signed)
Successful US guided Right thoracentesis. Yielded 600 mls of dark bloody fluid. Pt tolerated procedure well. No immediate complications.  Specimen drawn and held and will be sent for labs per ordering physician. CXR ordered.  Gareth Eagle R PA-C 07/30/2014 5:01 PM

## 2014-07-30 NOTE — Progress Notes (Signed)
CSW following to assist with d/c planning. CSW met with pt's daughter in law while pt was receiving care. Support provided. Clinicals sent to [facility]. Pt plans to return to [facility] when stable.   Roselyn Reef Carmyn Hamm LCSW [phone]

## 2014-07-30 NOTE — Progress Notes (Signed)
eLink Physician-Brief Progress Note Patient Name: [name] [name] DOB: 08/26/1940 MRN: [id number]   Date of Service  07/30/2014  HPI/Events of Note  Patient currently with know hemoptysis secondary to lung malignancy  eICU Interventions  Currently on Ventimask, stable.  Will continue to monitor.     Intervention Category Intermediate Interventions: Respiratory distress - evaluation and management  Kiva Norland 07/30/2014, 3:50 PM

## 2014-07-30 NOTE — Progress Notes (Signed)
Subjective: Pt familiar to IR service from recent cervical LN bx. Pt has metastatic adenocarcinoma admitted with acute resp failure in setting of hemoptysis and bilateral pleural effusions. Had right thoracentesis today yielding 600 cc's dark bloody fluid ( only above amt removed due to hypotension). He was previously tapped on right side 5/30 by CCM yielding 1500 cc's bloody fluid. Pt also has anemia and thrombocytopenia .  Oncology has now requested BM bx for further evaluation. Pt very weak appearing, tachycardic, occ nausea, dyspneic.   Objective: Vital signs in last 24 hours: Temp:  [97.9 F (36.6 C)-98.3 F (36.8 C)] 98.1 F (36.7 C) (06/01 1127) Pulse Rate:  [63-117] 112 (06/01 1650) Resp:  [16-31] 28 (06/01 1650) BP: (83-133)/(35-76) 92/50 mmHg (06/01 1650) SpO2:  [84 %-98 %] 86 % (06/01 1650) FiO2 (%):  [55 %-80 %] 80 % (06/01 1623) Weight:  [140 lb 14 oz (63.9 kg)] 140 lb 14 oz (63.9 kg) (06/01 0400) Last BM Date: 07/29/14  Intake/Output from previous day: 05/31 0701 - 06/01 0700 In: 2378.3 [P.O.:600; I.V.:1123.3; Blood:205; IV Piggyback:450] Out: 568 [Urine:875] Intake/Output this shift: Total I/O In: 791.7 [P.O.:240; Blood:551.7] Out: -  Pt awake/alert; chest - dim BS bases ,> on right; heart -tachy but regular; abd- soft,+BS,NT   Lab Results:   Recent Labs  07/29/14 0350 07/30/14 0343  WBC 7.7 7.0  HGB 8.6* 6.3*  HCT 25.0* 18.4*  PLT 51* 32*   BMET  Recent Labs  07/29/14 0350 07/30/14 0343  NA 125* 132*  K 4.1 3.8  CL 99* 103  CO2 18* 20*  GLUCOSE 80 126*  BUN 42* 52*  CREATININE 0.78 0.65  CALCIUM 7.2* 7.4*   PT/INR  Recent Labs  07/30/14 1300  LABPROT 15.9*  INR 1.26   ABG  Recent Labs  07/30/14 1546  PHART 7.301*  HCO3 18.3*    Studies/Results: Dg Chest Port 1 View  07/30/2014   CLINICAL DATA:  Abnormal lung sounds. Shortness of breath, congestion, and cough.  EXAM: PORTABLE CHEST - 1 VIEW  COMPARISON:  07/29/2014  FINDINGS:  Right PICC has been placed and terminates over the lower SVC. Cardiac silhouette is partially obscured, although the cardiomediastinal silhouette is grossly unchanged. Right upper lobe consolidation appears slightly more confluent than on the prior study. Patchy consolidation and coarse reticular opacities in the right midlung and right lung base have also mildly increased. Left basilar opacities do not appear significantly changed. Moderate right pleural effusion appears larger than on the prior study, although some of this could be due to differences in patient positioning. Small left pleural effusion is similar to the prior study. No pneumothorax is identified.  IMPRESSION: 1. Worsening right lung opacities consistent with pneumonia. 2. Likely increased size of moderate right pleural effusion. Unchanged small left pleural effusion.   Electronically Signed   By: Logan Bores   On: 07/30/2014 14:17   Dg Chest Port 1 View  07/29/2014   CLINICAL DATA:  Follow up right pleural effusion pneumonia.  EXAM: PORTABLE CHEST - 1 VIEW  COMPARISON:  Radiographs and CT 07/24/2014.  FINDINGS: 0504 hr. The heart size and mediastinal contours are stable with aortic atherosclerosis. There are lower lung volumes. Allowing for this, there has been no significant change in the bilateral pleural effusions and associated bibasilar pulmonary opacities. Cavitary right upper lobe consolidation also appears unchanged. There is no evidence of pneumothorax. The bones appear stable.  IMPRESSION: No significant change in pleural effusions and right upper lobe pneumonia.  Electronically Signed   By: Richardean Sale M.D.   On: 07/29/2014 07:25   Dg Chest Port 1 View  07/21/2014   CLINICAL DATA:  Status post right thoracentesis  EXAM: PORTABLE CHEST - 1 VIEW  COMPARISON:  CT chest dated 07/25/2014  FINDINGS: No pneumothorax status post right thoracentesis.  Right upper lobe opacity, suspicious for pneumonia.  Layering bilateral pleural  effusions.  The heart is normal in size.  IMPRESSION: No pneumothorax status post right thoracentesis.  Right upper lobe opacity, suspicious for pneumonia.  Layering bilateral pleural effusions.   Electronically Signed   By: Julian Hy M.D.   On: 07/14/2014 19:08    Anti-infectives: Anti-infectives    Start     Dose/Rate Route Frequency Ordered Stop   07/30/14 2300  vancomycin (VANCOCIN) IVPB 1000 mg/200 mL premix     1,000 mg 200 mL/hr over 60 Minutes Intravenous Every 12 hours 07/30/14 1519     07/30/14 1800  cefTAZidime (FORTAZ) 1 g in dextrose 5 % 50 mL IVPB     1 g 100 mL/hr over 30 Minutes Intravenous Every 8 hours 07/30/14 1543     07/30/14 1600  metroNIDAZOLE (FLAGYL) IVPB 500 mg     500 mg 100 mL/hr over 60 Minutes Intravenous Every 8 hours 07/30/14 1534     07/29/14 0400  vancomycin (VANCOCIN) IVPB 750 mg/150 ml premix  Status:  Discontinued     750 mg 150 mL/hr over 60 Minutes Intravenous Every 8 hours 07/03/2014 1926 07/30/14 1519   07/29/14 0200  piperacillin-tazobactam (ZOSYN) IVPB 3.375 g  Status:  Discontinued     3.375 g 12.5 mL/hr over 240 Minutes Intravenous Every 8 hours 07/20/2014 1926 07/30/14 1534   07/14/2014 1930  vancomycin (VANCOCIN) IVPB 1000 mg/200 mL premix     1,000 mg 200 mL/hr over 60 Minutes Intravenous STAT 07/06/2014 1920 07/22/2014 2059   07/22/2014 1930  piperacillin-tazobactam (ZOSYN) IVPB 3.375 g     3.375 g 100 mL/hr over 30 Minutes Intravenous STAT 07/03/2014 1920 07/05/2014 2029      Assessment/Plan: Pt with metastatic adenocarcinoma, resp failure with bil effusions/PNA, recent right thoracentesis yielding bloody fluid/malig cells, hemoptysis, anemia , thrombocytopenia. Request received from oncology for CT guided bone marrow biopsy for further evaluation. Risks and benefits discussed with the patient/daughter in law including, but not limited to bleeding, infection, damage to adjacent structures or low yield requiring additional tests. All of the  patient's questions were answered, patient is agreeable to proceed. Consent signed and in chart. Procedure tent planned for 6/2 with minimal IV conscious sedation recommended.    LOS: 2 days  15 minutes were spent eval pt for CT bone marrow biopsy  D. Rowe Robert 07/30/2014

## 2014-07-30 NOTE — Progress Notes (Signed)
Notified by lab of critical Hb 6.3 at 0502.  Covering MD paged at [phone].  Message left for him to call back.  Awaiting return call and further orders at this time.  Will cont to monitor pt status.  ~ Petra Kuba, RN

## 2014-07-30 NOTE — Progress Notes (Signed)
eLink Physician-Brief Progress Note Patient Name: [name] [name] DOB: 01-Jan-1941 MRN: [id number]   Date of Service  07/30/2014  HPI/Events of Note  Hgb = 6.3.  eICU Interventions  Transfuse 1 unit PRBC now.      Intervention Category Intermediate Interventions: Other:  Sommer,Steven Cornelia Copa 07/30/2014, 5:11 AM

## 2014-07-30 NOTE — Progress Notes (Signed)
Unfortunately his EGFR markers were negative. I discussed with oncology-prognosis is poor. Chemotherapy is being considered, but his pancytopenia is the limiting factor. If the right effusion reaccumulates, he will need Pleurx catheter. I would escalate the palliative discussion, unfortunately his son is not very receptive to this discussion. I have not been able to figure out where the patient stands on this. We'll let the palliative team continue this discussion. [facility] M to sign off  Rigoberto Noel. MD [phone]

## 2014-07-30 NOTE — Progress Notes (Signed)
Initial Nutrition Assessment  DOCUMENTATION CODES:  Non-severe (moderate) malnutrition in context of chronic illness as evidenced by moderate fat and muscle depletion  INTERVENTION:  Ensure Enlive po BID, each supplement provides 350 kcal and 20 grams of protein  Pt to bring nutritional supplements from home (Atkins protein shakes). Recommend BID - 160 kcal and 9 g protein.  RD will continue to monitor  NUTRITION DIAGNOSIS:  Increased nutrient needs related to cancer and cancer related treatments as evidenced by estimated needs.  GOAL:  Patient will meet greater than or equal to 90% of their needs  MONITOR:  PO intake, Weight trends, Supplement acceptance, Labs  REASON FOR ASSESSMENT:  Malnutrition Screening Tool   ASSESSMENT: 74 year old male with newly diagnosed metastatic adenocarcinoma of uncertain origin was admitted through the [facility] emergency department on 07/03/2014 for acute hypoxemic respiratory failure, hemoptysis, and a large right pleural effusion.  - Per oncology note, chemotherapy being considered. Palliative care consult pending. Per CCM prognosis is poor.  - Pt's weight is stable. Eating well. Today pt had a hamburger brought to him from [facility]. He is drinking nutritional shakes brought to him from home [employer].)  - Recommend that pt continue nutritional supplements. Will order Ensure supplements as well.  - Pt reports no problems with swallowing or chewing.  - RD will continue to monitor.  - Labs and medications reviewed  Height:  Ht Readings from Last 1 Encounters:  07/01/2014 '5\' 7"'$  (1.702 m)    Weight:  Wt Readings from Last 1 Encounters:  07/30/14 140 lb 14 oz (63.9 kg)    Ideal Body Weight:  67.3 kg  Wt Readings from Last 10 Encounters:  07/30/14 140 lb 14 oz (63.9 kg)  07/22/14 138 lb (62.596 kg)  07/10/14 138 lb 3.7 oz (62.7 kg)  01/14/14 147 lb 3.2 oz (66.769 kg)  12/11/13 147 lb 4.3 oz (66.8 kg)  12/05/13 148 lb 1.6  oz (67.178 kg)  11/29/13 149 lb (67.586 kg)  11/19/13 146 lb 11.2 oz (66.543 kg)  11/12/13 145 lb (65.772 kg)  08/25/11 140 lb (63.504 kg)    BMI:  Body mass index is 22.06 kg/(m^2).  Estimated Nutritional Needs:  Kcal:  1800-2000  Protein:  90-100 g  Fluid:  2.0 L/day  Skin:  Reviewed, no issues  Diet Order:  Diet regular Room service appropriate?: Yes; Fluid consistency:: Thin  EDUCATION NEEDS:  Education needs addressed   Intake/Output Summary (Last 24 hours) at 07/30/14 1508 Last data filed at 07/30/14 1127  Gross per 24 hour  Intake   2720 ml  Output    425 ml  Net   2295 ml    Last BM:  6/1  Laurette Schimke Soldier, [place], [place]

## 2014-07-30 NOTE — Progress Notes (Addendum)
ANTIBIOTIC CONSULT NOTE - FOLLOW-UP  Pharmacy Consult for Vancomycin, Zosyn --> Ceftazidime Indication: pneumonia  Allergies  Allergen Reactions  . Other     Catfish + IV pain medication (unknown name)-vomits    Patient Measurements: Height: '5\' 7"'$  (170.2 cm) Weight: 140 lb 14 oz (63.9 kg) IBW/kg (Calculated) : 66.1  Vital Signs: Temp: 98.1 F (36.7 C) (06/01 1127) Temp Source: Axillary (06/01 1127) BP: 117/76 mmHg (06/01 1400) Pulse Rate: 112 (06/01 1400) Intake/Output from previous day: 05/31 0701 - 06/01 0700 In: 2378.3 [P.O.:600; I.V.:1123.3; Blood:205; IV Piggyback:450] Out: 992 [Urine:875] Intake/Output from this shift: Total I/O In: 791.7 [P.O.:240; Blood:551.7] Out: -   Labs:  Recent Labs  07/16/2014 1340 07/29/14 0350 07/30/14 0343  WBC 9.2 7.7 7.0  HGB 6.3* 8.6* 6.3*  PLT 45* 51* 32*  CREATININE 0.55* 0.78 0.65   Estimated Creatinine Clearance: 76.5 mL/min (by C-G formula based on Cr of 0.65).  Recent Labs  07/30/14 1100  Clare 19     Microbiology: Recent Results (from the past 720 hour(s))  Culture, blood (routine x 2)     Status: None   Collection Time: 07/04/14  1:54 PM  Result Value Ref Range Status   Specimen Description BLOOD LEFT ANTECUBITAL  Final   Special Requests BOTTLES DRAWN AEROBIC AND ANAEROBIC 5 CC EA  Final   Culture   Final    NO GROWTH 5 DAYS Performed at [facility]    Report Status 07/10/2014 FINAL  Final  Culture, blood (routine x 2)     Status: None   Collection Time: 07/04/14  1:55 PM  Result Value Ref Range Status   Specimen Description BLOOD BLOOD RIGHT FOREARM  Final   Special Requests BOTTLES DRAWN AEROBIC AND ANAEROBIC 5 CC EA  Final   Culture   Final    NO GROWTH 5 DAYS Performed at [facility]    Report Status 07/10/2014 FINAL  Final  Culture, Urine     Status: None   Collection Time: 07/04/14  1:59 PM  Result Value Ref Range Status   Specimen Description URINE, RANDOM  Final    Special Requests NONE  Final   Colony Count NO GROWTH Performed at [facility]   Final   Culture NO GROWTH Performed at [facility]   Final   Report Status 07/05/2014 FINAL  Final  MRSA PCR Screening     Status: None   Collection Time: 07/04/14  2:42 PM  Result Value Ref Range Status   MRSA by PCR NEGATIVE NEGATIVE Final    Comment:        The GeneXpert MRSA Assay (FDA approved for NASAL specimens only), is one component of a comprehensive MRSA colonization surveillance program. It is not intended to diagnose MRSA infection nor to guide or monitor treatment for MRSA infections.   Urine culture     Status: None   Collection Time: 07/16/14 11:26 AM  Result Value Ref Range Status   Specimen Description URINE, CLEAN CATCH  Final   Special Requests NONE  Final   Colony Count NO GROWTH Performed at [facility]   Final   Culture NO GROWTH Performed at [facility]   Final   Report Status 07/17/2014 FINAL  Final  Body fluid culture     Status: None (Preliminary result)   Collection Time: 07/26/2014  6:00 PM  Result Value Ref Range Status   Specimen Description PLEURAL RIGHT  Final   Special Requests Normal  Final   Gram Stain   Final    FEW WBC PRESENT, PREDOMINANTLY PMN NO ORGANISMS SEEN Performed at [facility]    Culture   Final    NO GROWTH 1 DAY Performed at [facility]    Report Status PENDING  Incomplete    Medical History: Past Medical History  Diagnosis Date  . Lung nodule 2012  . Insomnia     takes Restoril nightly as needed  . Hypertension     takes Benazepril and Metoprolol daily  . Pneumonia     hx of;many yrs ago  . AAA (abdominal aortic aneurysm)   . Joint pain   . Joint swelling   . Urinary frequency     takes Terazosin daily  . Urinary urgency   . Nocturia   . Enlarged prostate     slightly  . Rheumatoid arthritis(714.0)     takes Plaquenil daily and Methotrexate weekly     Assessment: 28 yoM with newly diagnosed metastatic adenocarcinoma of uncertain origin admitted for acute hypoxemic respiratory failure secondary to possible HCAP, hemoptysis, and a large right pleural effusion.  Patient was recently discharged on 07/18/14.  CT chest shows right upper lobe consolidation with local interstitial infiltration and right/left pleural effusions.  Emergent thoracentesis performed in ED and 1500 mL of very bloody fluid removed, sent for culture.  Pharmacy consulted to dose Vancomycin and Zosyn for HCAP.  Pt is afebrile, WBC WNL, CrCl~77 ml/min. CXR today shows worsening right lung opacities consistent with pneumonia.   *Of note, per discussion with RN, Lenoria Farrier, Vancomycin trough level of 19 mcg/mL today was collected by her at 1300, NOT 1100 (RN to notify lab to correct time in [facility]).  Goal of Therapy:  Vancomycin trough level 15-20 mcg/ml  Doses adjusted per renal function Eradication of infection  Plan:  1.  Vancomycin trough level drawn ~ 1 hour late, so actual Vancomycin trough level higher. Expect continued accumulation on q8h regimen in elderly patient. Will change Vancomycin to 1g IV q12h. 2.  Plan to recheck Vancomycin trough level at new steady state, as warranted.  3.  Zosyn dose of 3.375g IV q8h (4 hour infusion time) remains appropriate for renal function.  4.  Continue to monitor renal function, cultures, clinical course.   Lindell Spar, PharmD, BCPS Pager: [phone] 07/30/2014 3:32 PM   Addendum:  MD changed Zosyn to Ceftazidime per Rx and Flagyl per MD given recent concerns of aspiration.   D/C Zosyn.  Start Ceftazidime 1g IV q8h.  Flagyl dosing of '500mg'$  IV q8h appropriate for indication.   Lindell Spar, PharmD, BCPS Pager: [phone] 07/30/2014 3:45 PM

## 2014-07-30 NOTE — Progress Notes (Addendum)
[facility] PROGRESS NOTE  [name] [name] KGS:[id number] DOB: 1940/05/25 DOA: 07/20/2014 PCP: [name], MD  Brief Summary  74 year old male with newly diagnosed metastatic adenocarcinoma of uncertain origin early May 2016 who was readmitted through the [facility] emergency department on 07/20/2014 for acute hypoxemic respiratory failure, hemoptysis, and a large right pleural effusion.  CT chest May 30: Right upper lobe consolidation with local interstitial infiltration significantly increased from previous, large right-sided pleural effusion, moderate-sized left-sided pleural effusion, improved neck lymphadenopathy compared to May 5 study.  He underwent thoracentesis with removal of 1522m of bloody fluid and continues to have hemoptysis with anemia and thrombocytopenia.    Assessment/Plan  Widely metastatic adenocarcinoma of uncertain etiology, EGFR neg 5/13, likely with malignant pleural effusions -  Family wants aggressive treatment but has been counseled by PCCM, palliative care and oncology that prognosis is poor -  PICC line placed to start chemotherapy -  Will discuss possible feeding tube to maximize nutrition -  Appreciate Dr. EMarin Olpassistance -  We have recommended DNR/DNI, however, patient remains FULL CODE at this time.  Acute Hypoxemic Respiratory Failure - secondary to possible healthcare associated pneumonia, associated with hemoptysis and a large malignant bloody right-sided pleural effusion. Radiology feels that the right upper lobe consolidation seen on the CT chest is consistent with pneumonia, however I am in agreement with PCCM that this is very concerning for probable malignancy-related pulmonary hemorrhage.   -  Continue vancomycin  -  D/c zosyn -  Start broad spectum cephalosporin + flagyl given recent concerns for aspiration -  Pleural culture NGTD -  No blood cultures and afebrile -  Increasing to venturi mask -  Anticipate that he will probably require  intubation   2. Hemoptysis - secondary to thrombocytopenia, pneumonia versus mass, pulmonary hemorrhage.  Bleeding and respiratory distress increasing during the day today -  Transfuse to keep platelets > 50K  -  Keep RIGHT side DOWN to protect left lung as much as possible  3. Malignant pleural Effusion - Very bloody, 1500 ml pleural fluid removed 5/30, this is most likely due to pleural metastasis bleeding in the setting of thrombocytopenia.   Thoracentesis helped his dyspnea.   -  Pleural culture NGTD -  Cytology pending - Oxygen as needed for O2 saturation greater than 92% -  Unable to get repeat thoracentesis by radiology today -  Pulmonology to assist with management of effusion:  Note from Dr. AElsworth Sohorecommended pleurx but Dr. MMelina Schoolsrecommends against because it would drain his protein stores.   -  Underwent repeat thoracentesis by IR on 6/1 with removal of additional 6021mof fluid  Sinus tachycardia secondary to bleeding/symptomatic anemia or ongoing hyperthyroidism -  Transfuse 2 units PRBC today and anticipate he may need  -  Telemetry monitoring -  Hold blood pressure medications for now -  Hold blood pressure meds for now -  Thyroid evaluation as below  Hyponatremia ? Of malignancy/SIADH, but now appears to be a dehydrated with elevated BUN and rising creatinine -  Daily BMP and transfusion today  Heme positive stool but clinically not bleeding from GI tract:  Probably has swallowed some blood from his copious hemoptysis  Hemorrhagic anemia and thrombocytopenia likely secondary to bleeding and possibly zosyn -  Transfuse as needed to keep hgb > 8 and plt > 50K -  Transfuse 2 units PRBC and plt pack 6/1 -  D/c zosyn and abx changes as above -  Bone marrow biopsy tomorrow  -  Check fibrinogen and repeat smear  DVT left subclavian vein via MRI 5/16 -  No anticoagulation considering bleeding and thrombocytopenia   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 Buddy Duty of admission  Pain in setting of thoracic metastasis and right leg weakness secondary to spinal cord compression Continue Decadron Continue long-acting OxyContin Continue [name] acting oxycodone Discuss radiation oncology treatments with Dr. Marin Olp  Moderate malnutrition -  Given increased respiratory distress, will make NPO for now but advance diet ASAP  Diet:  regular Access:  PIV IVF:  off Proph:  SCDs  Code Status: full Family Communication: patient and daughter-in-law Disposition Plan:  This patient would otherwise qualify for hospice care and I would estimate a life expectancy of several weeks and possibly 1-2 months.  Since he is electing to undergo chemotherapy, his prognosis may be even more guarded and depending on progression, may anticipate in-hospital death.    Consultants:  PCCM  Oncology, Dr. Marin Olp  IR  Procedures:  CT angio chest   Antibiotics:  Vancomycin 5/30 >  Zosyn 5/30 >    HPI/Subjective:  Patient c/o increasing SOB.  Cannot sleep due to dyspnea and rattle in his chest.  Has some nausea without vomiting.    Objective: Filed Vitals:   07/30/14 0909 07/30/14 1000 07/30/14 1127 07/30/14 1300  BP: 97/45 117/70 120/74 125/70  Pulse:  111 102 102  Temp: 97.9 F (36.6 C)  98.1 F (36.7 C)   TempSrc: Axillary  Axillary   Resp: 27 28 25 23   Height:      Weight:      SpO2: 91% 90% 93% 89%    Intake/Output Summary (Last 24 hours) at 07/30/14 1352 Last data filed at 07/30/14 1127  Gross per 24 hour  Intake 2973.75 ml  Output    675 ml  Net 2298.75 ml   Filed Weights   07/17/2014 1915 07/29/14 0500 07/30/14 0400  Weight: 60.5 kg (133 lb 6.1 oz) 61.6 kg (135 lb 12.9 oz) 63.9 kg (140 lb 14 oz)    Exam:   General:  Adult male with frequent cough productive of bright red hemoptysis suctioning about a tablespoon of blood with each cough, + SCM and subcostal retractions and  tachypnea  HEENT:  NCAT, MMM  Cardiovascular:  Tachycardic RR, nl S1, S2 no mrg, 2+ pulses, warm extremities  Respiratory:  rhonchorous bilateral breath sounds, diminished at the right base with rales to the right apex and left base, no wheezes, moderate increased WOB  Abdomen:   NABS, soft, NT/ND  MSK:   Normal tone and bulk, 2+ pitting bilateral LEE  Neuro:  Grossly intact  Data Reviewed: Basic Metabolic Panel:  Recent Labs Lab 07/20/2014 1340 07/29/14 0350 07/30/14 0343  NA 124* 125* 132*  K 4.2 4.1 3.8  CL 96* 99* 103  CO2 21* 18* 20*  GLUCOSE 77 80 126*  BUN 37* 42* 52*  CREATININE 0.55* 0.78 0.65  CALCIUM 7.4* 7.2* 7.4*   Liver Function Tests:  Recent Labs Lab 07/05/2014 1340  AST 17  ALT 24  ALKPHOS 51  BILITOT 0.7  PROT 4.0*  ALBUMIN 1.9*    Recent Labs Lab 07/02/2014 1340  LIPASE 19*   No results for input(s): AMMONIA in the last 168 hours. CBC:  Recent Labs Lab 07/06/2014 1340 07/29/14 0350 07/30/14 0343  WBC 9.2 7.7 7.0  NEUTROABS 8.1*  --   --   HGB  6.3* 8.6* 6.3*  HCT 18.1* 25.0* 18.4*  MCV 91.0 88.7 90.2  PLT 45* 51* 32*   Cardiac Enzymes: No results for input(s): CKTOTAL, CKMB, CKMBINDEX, TROPONINI in the last 168 hours. BNP (last 3 results)  Recent Labs  07/13/2014 1340  BNP 55.8    ProBNP (last 3 results) No results for input(s): PROBNP in the last 8760 hours.  CBG: No results for input(s): GLUCAP in the last 168 hours.  Recent Results (from the past 240 hour(s))  Body fluid culture     Status: None (Preliminary result)   Collection Time: 07/22/2014  6:00 PM  Result Value Ref Range Status   Specimen Description PLEURAL RIGHT  Final   Special Requests Normal  Final   Gram Stain   Final    FEW WBC PRESENT, PREDOMINANTLY PMN NO ORGANISMS SEEN Performed at [facility]    Culture   Final    NO GROWTH 1 DAY Performed at [facility]    Report Status PENDING  Incomplete     Studies: Ct Angio Chest Pe  W/cm &/or Wo Cm  07/04/2014   CLINICAL DATA:  Increased cough and shortness of breath for 2 days. Low blood pressure. History of lung carcinoma with last chemotherapy 1 week ago.  EXAM: CT ANGIOGRAPHY CHEST WITH CONTRAST  TECHNIQUE: Multidetector CT imaging of the chest was performed using the standard protocol during bolus administration of intravenous contrast. Multiplanar CT image reconstructions and MIPs were obtained to evaluate the vascular anatomy.  CONTRAST:  144m OMNIPAQUE IOHEXOL 350 MG/ML SOLN  COMPARISON:  Chest CT, 07/03/2014.  FINDINGS: Angiographic study: Study degraded by motion somewhat limiting assessment for small segmental and subsegmental pulmonary emboli. Allowing for this, there is no evidence a pulmonary embolus. There is diffuse dilation of the ascending and arch portion of the thoracic aorta. Ascending is dilated the most, measuring 4.5 cm x 4.4 cm transversely. There are atherosclerotic calcifications along the aortic arch and at the origins of its branch vessels. Atherosclerotic changes noted along the descending thoracic aorta. Pulmonary arteries are normal in caliber.  Thoracic inlet: Thyroid gland is low in attenuation with an enhancing margin. No discrete nodule. Ill-defined small nodular opacities are noted at the neck base on both sides consistent with shotty adenopathy. None of these nodes are larger than 1 cm Desera Graffeo axis. These findings mildly improved from the prior CT.  Mediastinum and hila: Heart is normal in size. There are mild coronary artery calcifications. Mild ill-defined soft tissue is noted along the superior mediastinum consistent with sub cm shotty lymph nodes. There are no pathologically enlarged lymph nodes in no discrete mass. There is no discrete hilar mass or pathologically enlarged lymph node.  Lungs and pleura: Small to moderate left and moderate to large right pleural effusions. There is extensive consolidation, with both coarsely thickened interstitial  markings and patchy dense airspace consolidation, throughout much of the right upper lobe. There is some low attenuation material in the right lower lobe segmental bronchi likely mucus. The right lower lobe is collapsed. There is minor dependent subsegmental atelectasis in the right middle lobe. Some fluid extends along the minor fissure. Most of the left lower lobe is also collapse with a portion of its anterior aspect aerated. There is some mild reticular opacity centrally in the left upper lobe and a small amount of fluid along the oblique fissure. There is no pneumothorax. There is no pulmonary edema. The combination of the pleural effusions and atelectasis leads to mild  shift of mediastinal structures and heart to the left.  Limited upper abdomen:  Unremarkable.  Musculoskeletal: There lytic lesions in the right posterior lateral fourth and seventh ribs. There is a lytic lesion with a subtle pathologic fracture, nondisplaced, in the sternal manubrium. This is along its right aspect. No other bone lesions.  Review of the MIP images confirms the above findings.  IMPRESSION: 1. No evidence of a pulmonary embolus. 2. Coarse reticular and dense airspace consolidation in much of the right upper lobe. Pneumonia suspected. However, this could reflect noninfectious inflammation related to treatment or even pulmonary hemorrhage. Smaller similar area of opacity is noted in the left upper lobe. 3. Moderate to large right and small to moderate left pleural effusions. There is complete atelectasis of the right lower lobe and significant atelectasis in the left lower lobe. No pulmonary edema. 4. Neck base adenopathy has mildly improved from prior exam. 5. Lytic lesions in the right sternal manubrium and the right fourth and seventh ribs similar to the prior study.   Electronically Signed   By: Lajean Manes M.D.   On: 07/18/2014 17:07   Dg Chest Port 1 View  07/29/2014   CLINICAL DATA:  Follow up right pleural effusion  pneumonia.  EXAM: PORTABLE CHEST - 1 VIEW  COMPARISON:  Radiographs and CT 07/19/2014.  FINDINGS: 0504 hr. The heart size and mediastinal contours are stable with aortic atherosclerosis. There are lower lung volumes. Allowing for this, there has been no significant change in the bilateral pleural effusions and associated bibasilar pulmonary opacities. Cavitary right upper lobe consolidation also appears unchanged. There is no evidence of pneumothorax. The bones appear stable.  IMPRESSION: No significant change in pleural effusions and right upper lobe pneumonia.   Electronically Signed   By: Richardean Sale M.D.   On: 07/29/2014 07:25   Dg Chest Port 1 View  07/12/2014   CLINICAL DATA:  Status post right thoracentesis  EXAM: PORTABLE CHEST - 1 VIEW  COMPARISON:  CT chest dated 07/07/2014  FINDINGS: No pneumothorax status post right thoracentesis.  Right upper lobe opacity, suspicious for pneumonia.  Layering bilateral pleural effusions.  The heart is normal in size.  IMPRESSION: No pneumothorax status post right thoracentesis.  Right upper lobe opacity, suspicious for pneumonia.  Layering bilateral pleural effusions.   Electronically Signed   By: Julian Hy M.D.   On: 07/17/2014 19:08    Scheduled Meds: . sodium chloride   Intravenous Once  . sodium chloride   Intravenous Once  . sodium chloride   Intravenous Once  . antiseptic oral rinse  7 mL Mouth Rinse BID  . cyanocobalamin  1,000 mcg Intramuscular Once  . dexamethasone  8 mg Oral Q12H  . docusate sodium  200 mg Oral Daily  . methimazole  60 mg Oral Daily  . OxyCODONE  15 mg Oral Q12H  . pantoprazole  40 mg Oral BID  . piperacillin-tazobactam (ZOSYN)  IV  3.375 g Intravenous Q8H  . sodium chloride  1,000 mL Intravenous Once  . sodium chloride  10-40 mL Intracatheter Q12H  . vancomycin  750 mg Intravenous Q8H   Continuous Infusions: . sodium chloride 50 mL/hr at 07/30/14 1332    Active Problems:   Hemoptysis    Time spent: 30  min    Ajai Terhaar, [facility] Pager [phone]. If 7PM-7AM, please contact night-coverage at [email or web address], password [facility] 07/30/2014, 1:52 PM  LOS: 2 days

## 2014-07-30 NOTE — Progress Notes (Signed)
Dr. Marin Olp notified of CXR results. Orders received.

## 2014-07-30 DEATH — deceased

## 2014-07-31 ENCOUNTER — Other Ambulatory Visit: Payer: BC Managed Care – PPO

## 2014-07-31 ENCOUNTER — Inpatient Hospital Stay (HOSPITAL_COMMUNITY): Payer: BC Managed Care – PPO

## 2014-07-31 ENCOUNTER — Encounter (HOSPITAL_COMMUNITY): Payer: Self-pay | Admitting: Radiology

## 2014-07-31 ENCOUNTER — Ambulatory Visit: Payer: BC Managed Care – PPO | Admitting: Hematology & Oncology

## 2014-07-31 ENCOUNTER — Encounter (HOSPITAL_COMMUNITY): Payer: Self-pay | Admitting: Certified Registered Nurse Anesthetist

## 2014-07-31 ENCOUNTER — Ambulatory Visit: Payer: BC Managed Care – PPO

## 2014-07-31 DIAGNOSIS — Z515 Encounter for palliative care: Secondary | ICD-10-CM

## 2014-07-31 DIAGNOSIS — J910 Malignant pleural effusion: Secondary | ICD-10-CM

## 2014-07-31 DIAGNOSIS — Z7189 Other specified counseling: Secondary | ICD-10-CM

## 2014-07-31 DIAGNOSIS — C7951 Secondary malignant neoplasm of bone: Secondary | ICD-10-CM

## 2014-07-31 DIAGNOSIS — C3490 Malignant neoplasm of unspecified part of unspecified bronchus or lung: Principal | ICD-10-CM

## 2014-07-31 LAB — BASIC METABOLIC PANEL
ANION GAP: 8 (ref 5–15)
BUN: 52 mg/dL — AB (ref 6–20)
CALCIUM: 7.1 mg/dL — AB (ref 8.9–10.3)
CO2: 21 mmol/L — AB (ref 22–32)
Chloride: 106 mmol/L (ref 101–111)
Creatinine, Ser: 0.63 mg/dL (ref 0.61–1.24)
GFR calc non Af Amer: >60 mL/min (ref >60)
GLUCOSE: 129 mg/dL — AB (ref 65–99)
Potassium: 3.6 mmol/L (ref 3.5–5.1)
Sodium: 135 mmol/L (ref 135–145)

## 2014-07-31 LAB — BONE MARROW EXAM

## 2014-07-31 LAB — CBC
HEMATOCRIT: 18.4 % — AB (ref 39.0–52.0)
HEMATOCRIT: 19.6 % — AB (ref 39.0–52.0)
Hemoglobin: 6.3 g/dL — CL (ref 13.0–17.0)
Hemoglobin: 7 g/dL — ABNORMAL LOW (ref 13.0–17.0)
MCH: 30.9 pg (ref 26.0–34.0)
MCH: 29 pg (ref 26.0–34.0)
MCHC: 34.2 g/dL (ref 30.0–36.0)
MCHC: 35.7 g/dL (ref 30.0–36.0)
MCV: 90.2 fL (ref 78.0–100.0)
MCV: 81.3 fL (ref 78.0–100.0)
Platelets: 32 10*3/uL — ABNORMAL LOW (ref 150–400)
Platelets: 40 10*3/uL — ABNORMAL LOW (ref 150–400)
RBC: 2.04 MIL/uL — ABNORMAL LOW (ref 4.22–5.81)
RBC: 2.41 MIL/uL — AB (ref 4.22–5.81)
RDW: 16.5 % — ABNORMAL HIGH (ref 11.5–15.5)
RDW: 19.8 % — AB (ref 11.5–15.5)
WBC: 7 10*3/uL (ref 4.0–10.5)
WBC: 7.5 10*3/uL (ref 4.0–10.5)

## 2014-07-31 LAB — BLOOD GAS, ARTERIAL
Acid-base deficit: 8.7 mmol/L — ABNORMAL HIGH (ref 0.0–2.0)
Bicarbonate: 19.7 mEq/L — ABNORMAL LOW (ref 20.0–24.0)
Drawn by: 31814
FIO2: 1 %
O2 Saturation: 89.8 %
Patient temperature: 97.8
TCO2: 19.5 mmol/L (ref 0–100)
pCO2 arterial: 57.3 mmHg (ref 35.0–45.0)
pH, Arterial: 7.16 — CL (ref 7.350–7.450)
pO2, Arterial: 64.2 mmHg — ABNORMAL LOW (ref 80.0–100.0)

## 2014-07-31 LAB — PREPARE PLATELET PHERESIS
Unit division: 0
Unit division: 0

## 2014-07-31 LAB — MAGNESIUM: Magnesium: 2 mg/dL (ref 1.7–2.4)

## 2014-07-31 LAB — PREPARE RBC (CROSSMATCH)

## 2014-07-31 MED ORDER — SODIUM CHLORIDE 0.9 % IV SOLN
INTRAVENOUS | Status: DC
  Administered 2014-08-01: 100 mL via INTRAVENOUS
  Administered 2014-08-01: 1000 mL via INTRAVENOUS
  Administered 2014-08-02: 10:00:00 via INTRAVENOUS

## 2014-07-31 MED ORDER — MIDAZOLAM HCL 2 MG/2ML IJ SOLN: 2.0000 mg | INTRAMUSCULAR | Status: DC | PRN

## 2014-07-31 MED ORDER — SODIUM CHLORIDE 0.9 % IV BOLUS (SEPSIS)
1000.0000 mL | Freq: Once | INTRAVENOUS | Status: AC
  Administered 2014-07-31: 1000 mL via INTRAVENOUS

## 2014-07-31 MED ORDER — DEXTROSE IN LACTATED RINGERS 5 % IV SOLN
Freq: Once | INTRAVENOUS | Status: AC
  Administered 2014-07-31: 12:00:00 via INTRAVENOUS
  Filled 2014-07-31: qty 10

## 2014-07-31 MED ORDER — PROPOFOL 10 MG/ML IV BOLUS
INTRAVENOUS | Status: AC
  Filled 2014-07-31: qty 20

## 2014-07-31 MED ORDER — FUROSEMIDE 10 MG/ML IJ SOLN
20.0000 mg | Freq: Once | INTRAMUSCULAR | Status: AC
  Administered 2014-07-31: 20 mg via INTRAVENOUS
  Filled 2014-07-31: qty 2

## 2014-07-31 MED ORDER — CHLORHEXIDINE GLUCONATE 0.12 % MT SOLN
15.0000 mL | Freq: Two times a day (BID) | OROMUCOSAL | Status: DC
  Administered 2014-08-01 – 2014-08-02 (×4): 15 mL via OROMUCOSAL
  Filled 2014-07-31 (×4): qty 15

## 2014-07-31 MED ORDER — FENTANYL CITRATE (PF) 100 MCG/2ML IJ SOLN
INTRAMUSCULAR | Status: AC
  Filled 2014-07-31: qty 2

## 2014-07-31 MED ORDER — SODIUM CHLORIDE 0.9 % IV SOLN
Freq: Once | INTRAVENOUS | Status: AC
  Administered 2014-07-31: 10:00:00 via INTRAVENOUS

## 2014-07-31 MED ORDER — CETYLPYRIDINIUM CHLORIDE 0.05 % MT LIQD
7.0000 mL | Freq: Four times a day (QID) | OROMUCOSAL | Status: DC
  Administered 2014-08-01 – 2014-08-02 (×7): 7 mL via OROMUCOSAL

## 2014-07-31 MED ORDER — FENTANYL CITRATE (PF) 100 MCG/2ML IJ SOLN
INTRAMUSCULAR | Status: AC | PRN
  Administered 2014-07-31: 25 ug via INTRAVENOUS

## 2014-07-31 MED ORDER — PANTOPRAZOLE SODIUM 40 MG IV SOLR
40.0000 mg | Freq: Every day | INTRAVENOUS | Status: DC
  Administered 2014-08-01 – 2014-08-02 (×2): 40 mg via INTRAVENOUS
  Filled 2014-07-31 (×2): qty 40

## 2014-07-31 MED ORDER — PROPOFOL 1000 MG/100ML IV EMUL: 0.0000 ug/kg/min | INTRAVENOUS | Status: DC

## 2014-07-31 MED ORDER — MIDAZOLAM HCL 2 MG/2ML IJ SOLN
INTRAMUSCULAR | Status: AC | PRN
  Administered 2014-07-31: 0.5 mg via INTRAVENOUS

## 2014-07-31 MED ORDER — NOREPINEPHRINE BITARTRATE 1 MG/ML IV SOLN
0.0000 ug/min | INTRAVENOUS | Status: DC
  Administered 2014-07-31: 2 ug/min via INTRAVENOUS
  Filled 2014-07-31: qty 4

## 2014-07-31 MED ORDER — SODIUM CHLORIDE 0.9 % IV SOLN
25.0000 ug/h | INTRAVENOUS | Status: DC
  Administered 2014-08-01: 200 ug/h via INTRAVENOUS
  Administered 2014-08-01 (×2): 50 ug/h via INTRAVENOUS
  Administered 2014-08-02: 200 ug/h via INTRAVENOUS
  Filled 2014-07-31 (×4): qty 50

## 2014-07-31 MED ORDER — NOREPINEPHRINE BITARTRATE 1 MG/ML IV SOLN
2.0000 ug/min | INTRAVENOUS | Status: DC
  Administered 2014-07-31: 2 ug/min via INTRAVENOUS
  Administered 2014-08-01 (×2): 10 ug/min via INTRAVENOUS
  Administered 2014-08-02: 14 ug/min via INTRAVENOUS
  Filled 2014-07-31 (×6): qty 4

## 2014-07-31 MED ORDER — IPRATROPIUM-ALBUTEROL 0.5-2.5 (3) MG/3ML IN SOLN
3.0000 mL | Freq: Four times a day (QID) | RESPIRATORY_TRACT | Status: DC
  Administered 2014-08-01 – 2014-08-02 (×6): 3 mL via RESPIRATORY_TRACT
  Filled 2014-07-31 (×7): qty 3

## 2014-07-31 MED ORDER — PHENYLEPHRINE HCL 10 MG/ML IJ SOLN: 30.0000 ug/min | INTRAVENOUS | Status: DC

## 2014-07-31 MED ORDER — FENTANYL BOLUS VIA INFUSION
25.0000 ug | INTRAVENOUS | Status: DC | PRN
  Administered 2014-08-01: 25 ug via INTRAVENOUS
  Filled 2014-07-31: qty 25

## 2014-07-31 MED ORDER — FENTANYL CITRATE (PF) 100 MCG/2ML IJ SOLN: 50.0000 ug | INTRAMUSCULAR | Status: DC | PRN

## 2014-07-31 MED ORDER — MIDAZOLAM HCL 2 MG/2ML IJ SOLN
INTRAMUSCULAR | Status: AC
  Filled 2014-07-31: qty 2

## 2014-07-31 MED ORDER — FENTANYL CITRATE (PF) 100 MCG/2ML IJ SOLN
50.0000 ug | Freq: Once | INTRAMUSCULAR | Status: AC
Start: 2014-07-31 — End: 2014-07-31

## 2014-07-31 NOTE — Progress Notes (Signed)
PULMONARY / CRITICAL CARE MEDICINE   Name: [name] [name] MRN: [id number] DOB: 05-19-1940    ADMISSION DATE:  06/29/2014 CONSULTATION DATE:  07/26/2014  REFERRING MD :  Emergency room physician  CHIEF COMPLAINT:  Shortness of breath, hemoptysis  INITIAL PRESENTATION: 74 year old male with newly diagnosed metastatic adenocarcinoma of uncertain origin was admitted through the [facility] emergency department on 07/23/2014 for acute hypoxemic respiratory failure, hemoptysis, and a large right pleural effusion.  STUDIES:  5/30  CT Chest >> Right upper lobe consolidation with local interstitial infiltration significantly increased from previous, large right-sided pleural effusion, moderate-sized left-sided pleural effusion, improved neck lymphadenopathy compared to May 5 study  SIGNIFICANT EVENTS: 5/30  Thoracentesis >> 1500 mL of very bloody fluid removed- cytology POS  SUBJECTIVE: afebrile Dyspnea worse, on NRB  no chest pain C/o hemoptysis   VITAL SIGNS: Temp:  [97.7 F (36.5 C)-98.3 F (36.8 C)] 97.9 F (36.6 C) (06/02 0957) Pulse Rate:  [88-122] 108 (06/02 0957) Resp:  [16-30] 23 (06/02 0957) BP: (91-152)/(50-77) 108/65 mmHg (06/02 0957) SpO2:  [86 %-100 %] 95 % (06/02 0957) FiO2 (%):  [55 %-80 %] 80 % (06/02 0957) Weight:  [142 lb 3.2 oz (64.5 kg)] 142 lb 3.2 oz (64.5 kg) (06/02 0222)   HEMODYNAMICS:     VENTILATOR SETTINGS: Vent Mode:  [-]  FiO2 (%):  [55 %-80 %] 80 %   INTAKE / OUTPUT:  Intake/Output Summary (Last 24 hours) at 07/31/14 1027 Last data filed at 07/31/14 6606  Gross per 24 hour  Intake 1510.75 ml  Output    800 ml  Net 710.75 ml    PHYSICAL EXAMINATION: General:  Profoundly weak and pale appearing Asian male lying in bed  Neuro:  Awake and alert, interactive but quite weak, right leg slight weaker than left HEENT:  No trauma or blue bruising, oropharynx clear, no stigmata of bleeding, neck masses significantly improved since my last  exam Cardiovascular:  Regular rate and rhythm no systolic murmur Lungs:  Diminished breath sounds throughout the right, some crackles, scattered rhonchi bilaterally Abdomen:  Soft, nontender Musculoskeletal:  Significant atrophy of the right leg musculature throughout Skin:  Bruising over the right chest wall adjacent to radiation marking tattoos  LABS:  CBC  Recent Labs Lab 07/29/14 0350 07/30/14 0343 07/30/14 1636 07/31/14 0430  WBC 7.7 7.0  --  7.5  HGB 8.6* 6.3* 7.9* 7.0*  HCT 25.0* 18.4* 22.6* 19.6*  PLT 51* 32*  --  40*   Coag's  Recent Labs Lab 07/30/14 1300  APTT 31  INR 1.26     BMET  Recent Labs Lab 07/29/14 0350 07/30/14 0343 07/31/14 0430  NA 125* 132* 135  K 4.1 3.8 3.6  CL 99* 103 106  CO2 18* 20* 21*  BUN 42* 52* 52*  CREATININE 0.78 0.65 0.63  GLUCOSE 80 126* 129*   Electrolytes  Recent Labs Lab 07/29/14 0350 07/30/14 0343 07/31/14 0430  CALCIUM 7.2* 7.4* 7.1*  MG  --   --  2.0   Sepsis Markers No results for input(s): LATICACIDVEN, PROCALCITON, O2SATVEN in the last 168 hours.   ABG  Recent Labs Lab 07/30/14 1546  PHART 7.301*  PCO2ART 38.3  PO2ART 44.1*     Liver Enzymes  Recent Labs Lab 07/10/2014 1340  AST 17  ALT 24  ALKPHOS 51  BILITOT 0.7  ALBUMIN 1.9*   Cardiac Enzymes No results for input(s): TROPONINI, PROBNP in the last 168 hours.   Glucose No results for input(s):  GLUCAP in the last 168 hours.  Imaging Dg Chest Port 1 View  07/31/2014   CLINICAL DATA:  Shortness of Breath  EXAM: PORTABLE CHEST - 1 VIEW  COMPARISON:  07/30/2014  FINDINGS: Cardiomediastinal silhouette is stable. There is elevation of the right hemidiaphragm. Persistent masslike consolidation in right upper lobe. Streaky interstitial prominence and airspace opacification right lung is unchanged from prior exam. Interstitial tumor spread or pneumonia cannot be excluded. Small residual right pleural effusion. Left lung is clear. There is no  pneumothorax.  IMPRESSION: There is elevation of the right hemidiaphragm. Persistent masslike consolidation in right upper lobe. Streaky interstitial prominence and airspace opacification right lung is unchanged from prior exam. Interstitial tumor spread or pneumonia cannot be excluded. Small residual right pleural effusion. Left lung is clear.   Electronically Signed   By: Lahoma Crocker M.D.   On: 07/31/2014 08:02   Dg Chest Port 1 View  07/30/2014   CLINICAL DATA:  Status post right thoracentesis. History of metastatic adenocarcinoma of the lung.  EXAM: PORTABLE CHEST - 1 VIEW  COMPARISON:  Chest x-ray earlier today at 1356 hours  FINDINGS: Diminished right pleural fluid volume noted since the prior chest x-ray. Following thoracentesis, no pneumothorax is evident and there is some improved aeration at the right lung base. Mass-like opacity in the right upper lung and significant interstitial opacity throughout the remaining right lung appears stable and are suspicious for tumor with potential lymphangitic spread. Some of these changes may also relate to radiation therapy. Component of pneumonia may be present. No edema identified. The left lung shows stable basilar opacity likely representing atelectasis. PICC line extends into the lower SVC.  IMPRESSION: No pneumothorax following right-sided thoracentesis. There is improved aeration at the right lung base. Extensive pulmonary abnormalities on the right again identified suspicious for tumor with lymphangitic spread and/or radiation therapy changes. Component of pneumonia may be present.   Electronically Signed   By: Aletta Edouard M.D.   On: 07/30/2014 18:05   Dg Chest Port 1 View  07/30/2014   CLINICAL DATA:  Abnormal lung sounds. Shortness of breath, congestion, and cough.  EXAM: PORTABLE CHEST - 1 VIEW  COMPARISON:  07/29/2014  FINDINGS: Right PICC has been placed and terminates over the lower SVC. Cardiac silhouette is partially obscured, although the  cardiomediastinal silhouette is grossly unchanged. Right upper lobe consolidation appears slightly more confluent than on the prior study. Patchy consolidation and coarse reticular opacities in the right midlung and right lung base have also mildly increased. Left basilar opacities do not appear significantly changed. Moderate right pleural effusion appears larger than on the prior study, although some of this could be due to differences in patient positioning. Small left pleural effusion is similar to the prior study. No pneumothorax is identified.  IMPRESSION: 1. Worsening right lung opacities consistent with pneumonia. 2. Likely increased size of moderate right pleural effusion. Unchanged small left pleural effusion.   Electronically Signed   By: Logan Bores   On: 07/30/2014 14:17   US Thoracentesis Asp Pleural Space W/img Guide  07/30/2014   CLINICAL DATA:  Right Pleural Effusion  EXAM: ULTRASOUND GUIDED Right THORACENTESIS  COMPARISON:  None.  PROCEDURE: An ultrasound guided thoracentesis was thoroughly discussed with the patient and questions answered. The benefits, risks, alternatives and complications were also discussed. The patient understands and wishes to proceed with the procedure. Written consent was obtained.  Ultrasound was performed to localize and mark an adequate pocket of fluid in the Right  chest. The area was then prepped and draped in the normal sterile fashion. 1% Lidocaine was used for local anesthesia. Under ultrasound guidance a 19 gauge Yueh catheter was introduced. Thoracentesis was performed. The catheter was removed and a dressing applied.  COMPLICATIONS: None.  FINDINGS: A total of approximately 600 mls of Dark Bloody fluid was removed. A fluid sample was saved and will besent for laboratory analysis per ordering physician.  IMPRESSION: Successful ultrasound guided Right thoracentesis yielding 600 mls of pleural fluid.  Read by:  Gareth Eagle, PA-C   Electronically Signed   By: Jerilynn Mages.   Shick M.D.   On: 07/30/2014 17:13     ASSESSMENT / PLAN:  PULMONARY  A:  1.  Acute Hypoxemic Respiratory Failure - right upper lobe consolidation seen on the CT chest is consistent with malignancy  -lymphangitic spread  2. Hemoptysis - secondary to thrombocytopenia/cancer  3. Malignant rt  Pleural Effusion - Very bloody, 1500 ml pleural fluid removed 5/30, this is most likely due to pleural metastasis bleeding in the setting of thrombocytopenia  P:   Oxygen as needed for O2 saturation greater than 92% He does desire mechanical ventilation  CARDIOVASCULAR A:  Sinus tachycardia secondary to bleeding P:  Telemetry monitoring Hold blood pressure meds for now   RENAL A:   Hyponatremia ? Of malignancy P:   Monitor BMET and UOP Replace electrolytes as needed   GASTROINTESTINAL A:   Heme positive stool but clinically not bleeding from GI tract P:   Monitor for bleeding Regular diet  HEMATOLOGIC A:   Hemorrhagic anemia Thrombocytopenia likely secondary to bleeding and/or radiation treatment DVT left subclavian vein Adenocarcinoma of uncertain etiology, EGFR neg P:  Transfuse PRBC for Hb < 7 Transfuse platelets for 50 k or lower No anticoagulation considering bleeding and thrombocytopenia  Dr.Ennever following -low dose chemo being considered  INFECTIOUS A:  Healthcare associated pneumonia P:   BCx2 will not collect, minimize phlebotomy Vancomycin May 30> Zosyn May 30>   ENDOCRINE A:   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 Marin Olp  Summary -  prognosis - quite grim here Do not think pleurx will help him, hypoxia is due to lymphangitic spread   FAMILY  - Updates: Talked to  the patient's son at length on 05/30 & 5/31. father's overall prognosis appears very poor considering his malnourished state, profound weakness, right leg neurologic weakness and atrophy, metastatic malignancy, and acute illness. 6/2His prognosis is likely days rather than weeks. I emphasized that mechanical ventilation & CPR would not be of any benefit to him They prefer to maintain his CODE STATUS as FULL CODE at this time.   The patient is critically ill with multiple organ systems failure and requires high complexity decision making for assessment and support, frequent evaluation and titration of therapies, application of advanced monitoring technologies and extensive interpretation of multiple databases. Critical Care Time devoted to patient care services described in this note independent of APP time is 31 minutes.   Kara Mead MD. Shade Flood. [facility] Pager [phone] If no response call [phone]     07/31/2014, 10:27 AM

## 2014-07-31 NOTE — Progress Notes (Signed)
RT nasotracheal suctioned patient twice in the right nare. Attempted nts in left nare but was met with too much resistance. Got back small amount of thick frank blood. Pt tolerated procedure well. Post NTS VS-RR-23, SpO2-90% on non-rebreather, HR-95. RT will continue to monitor.

## 2014-07-31 NOTE — Progress Notes (Signed)
Chaplain visited with patient and daughter in law. Patient appears to be in good spirits. Patient is thankful for the care that he is receiving. Daughter in law says that "she wants her father in law to be comfortable". Chaplain will continue to follow.   07/31/14 1200  Clinical Encounter Type  Visited With Patient and family together  Visit Type Follow-up;Spiritual support;Social support  Referral From Palliative care team

## 2014-07-31 NOTE — Progress Notes (Signed)
Daily Progress Note   Patient Name: [name] [name]       Date: 07/31/2014 DOB: 04-Jan-1941  Age: 74 y.o. MRN#: [id number] Attending Physician: Janece Canterbury, MD Primary Care Physician: [name], MD Admit Date: 07/24/2014  Reason for Consultation/Follow-up: Establishing goals of care and Psychosocial/spiritual support  Subjective:  Continued conversation with son today regarding [place], patient is currently out of room having bone biopsy.  Son again reiterates the importance of not "taking away hope", he is very clear that he and his father are open to all offered and available medical interventions to prolong life.  They understand the overall poor prognosis, they understand the risks of chemotherapy, they understand that  he may need to be intubated and are still asking "to try".    Son requests that information be delivered in small pieces.  He became very tearful, he was open to physical touch/offering emotional support. Discussed with son the culture of Western medicine and approach to truth telling, and concern for causing more harm than good.   This NP will continue to  support patient, family and staff and holistically.  Son feels alone at this difficult time, he mother remains in [place].  It is my understanding that she is trying to get home.   Length of Stay: 3 days  Current Medications: Scheduled Meds:  . sodium chloride   Intravenous Once  . sodium chloride   Intravenous Once  . antiseptic oral rinse  15 mL Mouth Rinse 6 X Daily  . cefTAZidime (FORTAZ)  IV  1 g Intravenous Q8H  . docusate sodium  200 mg Oral Daily  . feeding supplement (ENSURE ENLIVE)  237 mL Oral BID BM  . ipratropium-albuterol  3 mL Nebulization Q4H  . methimazole  60 mg Oral Daily  . methylPREDNISolone (SOLU-MEDROL) injection  60 mg Intravenous Q6H  . metronidazole  500 mg Intravenous Q8H  . OxyCODONE  15 mg Oral Q12H  . pantoprazole  40 mg Oral BID  . sodium chloride  1,000 mL Intravenous Once  .  sodium chloride  10-40 mL Intracatheter Q12H  . vancomycin  1,000 mg Intravenous Q12H    Continuous Infusions:    PRN Meds: acetaminophen, morphine, ondansetron (ZOFRAN) IV, oxyCODONE, polyethylene glycol, senna, sodium chloride  Palliative Performance Scale: 30% at best     Vital Signs: BP 141/72 mmHg  Pulse 107  Temp(Src) 98 F (36.7 C) (Oral)  Resp 29  Ht '5\' 7"'$  (1.702 m)  Wt 64.5 kg (142 lb 3.2 oz)  BMI 22.27 kg/m2  SpO2 89% SpO2: SpO2: (!) 89 % O2 Device: O2 Device: Partial Rebreather Mask O2 Flow Rate: O2 Flow Rate (L/min): 15 L/min  Intake/output summary:  Intake/Output Summary (Last 24 hours) at 07/31/14 1600 Last data filed at 07/31/14 1405  Gross per 24 hour  Intake 1651.5 ml  Output    925 ml  Net  726.5 ml   Baseline Weight: Weight: 60.5 kg (133 lb 6.1 oz) Most recent weight: Weight: 64.5 kg (142 lb 3.2 oz)  Physical Exam:   General: chronically ill appearing, alert and orietned CVS: tachycardic Skin: warm and dry         Additional Data Reviewed: Recent Labs     07/30/14  0343  07/30/14  1636  07/31/14  0430  WBC  7.0   --   7.5  HGB  6.3*  7.9*  7.0*  PLT  32*   --   40*  NA  132*   --  135  BUN  52*   --   52*  CREATININE  0.65   --   0.63     Problem List:  Patient Active Problem List   Diagnosis Date Noted  . Malignant pleural effusion   . Malnutrition of moderate degree 07/30/2014  . Thrombocytopenia   . Hemoptysis 07/21/2014  . Acute respiratory failure 07/10/2014  . Bone metastases 07/09/2014  . HCAP (healthcare-associated pneumonia) 07/08/2014  . Hyperthyroidism 07/06/2014  . SDH (subdural hematoma) 07/06/2014  . Neck mass   . Pleural effusion   . 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 Metastatic cancer 07/04/2014  . Goiter 07/04/2014  . Leukocytosis 07/04/2014  . Dysphagia 07/04/2014  . SOB (shortness of breath) 07/04/2014  . Thyroid enlargement 07/04/2014  . Poor dentition 07/04/2014  . Epidural mass  07/04/2014  . Right paraspinous mass 07/04/2014  . Rib lesion 07/04/2014  . Dehydration 07/04/2014  . Anemia 07/04/2014  . Metastasis 07/04/2014  . AAA (abdominal aortic aneurysm) without rupture 11/12/2013  . Pain of right lower extremity-Lower Back and  Legs 11/12/2013  . Weakness-Bilateral Leg and Right arm > Left arm 11/12/2013  . Hypertension   . Osteoporosis   . Lung nodule   . 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 OTHER ABNORMAL BLOOD CHEMISTRY 02/11/2009  . SCIATICA, RIGHT 07/31/2008  . TOBACCO ABUSE 05/03/2007  . Pneumonia, organism unspecified 05/03/2007  . Rheumatoid arthritis 12/30/2006  . OSTEOPENIA 12/30/2006  . APPENDECTOMY, HX OF 12/30/2006  . Other postprocedural status(V45.89) 12/30/2006     Palliative Care Assessment & Plan    Code Status:  Full code--strongly recommended to consider DNR/DNI knowing poor outcomes in similar patients   Goals of Care:  Open to all offered and available medical interventions to prolong life,  Open to oncology treatments for cancer diagnosis.    Thank you for allowing the Palliative Medicine Team to assist in the care of this patient.   Time In: 1200 Time Out: 1235 Total Time 35 Prolonged Time Billed  no     Greater than 50%  of this time was spent counseling and coordinating care related to the above assessment and plan.   Knox Royalty, NP  07/31/2014, 4:00 PM  Please contact Palliative Medicine Team phone at [phone] for questions and concerns.    Discussed with Dr Sheran Fava and Sallye Ober NP

## 2014-07-31 NOTE — Progress Notes (Signed)
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 He had another thoracentesis yesterday. He had 600 mL of fluid removed. This was bloody.  I have to suspect that he has pleural involvement by his malignancy. It is apparent that his tumor is quite vascular.  He is on breathing treatments. He continues on IV antibiotics.  His chest x-ray yesterday is somewhat concerning for lymphangitic tumor spread. I'm not sure if he really has actual "pneumonia". I did start him on some steroids.  He really needs to get better nutrition. He is able to swallow. Hopefully, he will be oh to take in some more today.  His prealbumin is 11.7 which is actually better than I thought.  I had a long talk with he and his son today. I explained to his son that we are dealing with a very aggressive lung cancer. This was proven on his biopsy. I told him that chemotherapy is our only option because he does not have a genetic mutation that we could use a pill.  I told him that with IV chemotherapy, the chance of helping him probably would be 20-25% at best. I told them that the real problem is how long it might take for the chemotherapy to actually work. I just did not know if it worked, that he would be able to stay well long enough to see a response.  I told them that I would have to reduce the dose of chemotherapy because of his blood counts and because of his overall performance status. Even with dose reduced chemotherapy, we can still present and problems with more bleeding, infections, weight loss and decreased appetite.  Today, I think that we're going to have to give him another set of transfusions. His hemoglobin is 7. His platelet count is 40,000. I think he needs both red cells and platelets.  His fibrinogen is 159. I think we can hold off on cryoprecipitate. He has normal PT and PTT.  I spoke to his son in private. His son is very nice. He actually is quite reasonable. He realizes that his father cannot be cured.  He knows that treatment may not work but he just wants his father to have a chance to be able to get better. The fact that his father was fairly healthy 2 months ago is still troublesome and gets his son hope that his father might be able to get better.  I was quite honest with his son that I told him that his father may not leave the hospital alive. He understands this.  We did begin to address various end-of-life issues, particularly life-support maintenance. I do think that his son is reasonable and I don't think that he would put his father through heroic measures such as ventilatory support if he sees that nothing has helped his father.  I realize that this is a very difficult situation. 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 He is on radiation treatments. It is possible that the radiation may have caused some of his blood abnormalities.  I spent a good 40-45 minutes with Dr. [name] and his son. I thought this was a very helpful conversation. His son is grateful for the nursing staff down in the ICU. 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  We will start treatment on  him tomorrow. He'll get his transfusions today.  [name] [name] 5:6-7

## 2014-07-31 NOTE — Progress Notes (Signed)
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  CT chest May 30: Right upper lobe consolidation with local interstitial infiltration significantly increased from previous, large right-sided pleural effusion, moderate-sized left-sided pleural effusion, improved neck lymphadenopathy compared to May 5 study.  He underwent thoracentesis with removal of 1532m of bloody fluid and continues to have hemoptysis with anemia and thrombocytopenia.  He is had repeated thoracenteses which have improved his dyspnea. There is a strong cultural barrier. The son is adamant that his father be allowed to eat over the next few days and decreasing portions despite the risk of aspiration which shemay result in intubation, ventilator, life support, or even death. The son feels that by decreasing the amount that his father eats, that his father will realize that this is the end of life.  He also wants his father to live as long as possible and wants him to be full code, be placed on the ventilator if needed. Conversely, he states he would not want his father to have a feeding tube. The son states that he is upset that people telling his father that he has a poor prognosis and that he may die. He feels that his father is giving up hope and he wants his father to have hope. The son feels he has a very heavy burden at this time because his mother is still and signed and is [place] be with her husband at this time and he wants his father to remain alive so that he can be with his wife again. He asked his mother about the possibility of why support but did not  receive an answer and therefore he feels that everything should be done to keep his father alive in the meantime. I have explained that if he wants his father to live as long as possible that that means he may need to sacrifice some quality of life and I recommended strongly that he be made nothing by mouth to reduce the risk of aspiration. The son understands the risks, but still feels that he wants his father to be allowed to eat since this may be realistically the last few times that he has an opportunity to eat before he may die. The situation is very complicated and fortunately pulmonology and palliative care are both following along and assisting at this time.  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 EMarin Olpassistance -  We have recommended DNR/DNI, however, patient remains FULL CODE at this time.  Acute Hypoxemic Respiratory Failure - secondary to possible healthcare associated pneumonia, associated with hemoptysis and a large malignant bloody right-sided pleural effusion. Radiology feels that the right upper lobe consolidation seen on the CT chest is consistent with pneumonia, however I am in agreement with PCCM that this is very concerning for probable malignancy-related pulmonary hemorrhage.   -  Continue vancomycin  -  Continue fortaz + flagyl given recent concerns for aspiration -  Pleural culture NGTD -  No blood cultures and afebrile -  Wean from partial nonrebreather if possible -  Continue as needed thoracentesis  2. Hemoptysis - secondary to thrombocytopenia, pneumonia versus mass, pulmonary hemorrhage.  Bleeding and respiratory distress increasing during the day today -  Transfuse to keep platelets > 50K  -  Keep  RIGHT side DOWN to protect left lung as much as possible  3. Malignant pleural Effusion - Very bloody, 1500 ml pleural fluid removed 5/30, this is most likely due to pleural metastasis bleeding in the setting of thrombocytopenia.   Thoracentesis helped his dyspnea.   -  Pleural culture NGTD -  Cytology:  Positive for malignant cells - Oxygen as needed for O2 saturation greater than 92% -  Underwent repeat thoracentesis by IR on 6/1 with removal of additional 642m of fluid  Sinus tachycardia secondary to bleeding/symptomatic anemia or ongoing hyperthyroidism -  Transfuse as needed -  Telemetry monitoring -  Hold blood pressure medications for now -  Thyroid evaluation as below  Hyponatremia ? Of malignancy/SIADH, but now appears to be a dehydrated with elevated BUN and rising creatinine -  Daily BMP and transfusion today  Heme positive stool but clinically not bleeding from GI tract:  Probably has swallowed some blood from his copious hemoptysis  Hemorrhagic anemia and thrombocytopenia likely secondary to bleeding and possibly zosyn -  Transfuse as needed to keep hgb > 8 and plt > 50K -  Transfuse 2 units PRBC and plt pack 6/1  -  Transfuse 2 units PRBC and plt pack on 6/2 -  D/c'd zosyn -  Bone marrow biopsy results pending -  Fibrinogen mildly low.  Dr. 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 KBuddy Dutyunavailable until Monday  Pain in setting of thoracic metastasis and right leg weakness secondary to spinal cord compression Continue Decadron Continue long-acting OxyContin Continue Urania Pearlman acting oxycodone Discuss radiation oncology treatments with Dr. EMarin Olp Moderate malnutrition -  Given increased respiratory distress, recommend NPO but will advance to regular diet  for now per family's adamant request and trying to respect their attempts to balance comfort with life-prolonging measures  Diet:  regular Access:  PIV IVF:  off Proph:  SCDs  Code Status: full Family Communication: patient and daughter-in-law Disposition Plan:  This patient would otherwise qualify for hospice care and I would estimate a life expectancy of several weeks and possibly 1-2 months.  Since he is electing to undergo chemotherapy, his prognosis may be even more guarded and depending on progression, may anticipate in-hospital death.    Consultants:  PCCM  Oncology, Dr. EMarin Olp IR  Procedures:  CT angio chest   Antibiotics:  Vancomycin 5/30 >  Zosyn 5/30 > 6/1  Ceftazidime 6/1 >  Flagyl 6/1 >   HPI/Subjective:  Less SOB today, but still coughing.  Coughing up less blood today.  Ate some burger and drank some ensure today.  Still on partial nonrebreather with O2 sats in the low 90s.  Objective: Filed Vitals:   07/31/14 1050 07/31/14 1053 07/31/14 1104 07/31/14 1209  BP: 92/68 98/71 102/69 103/81  Pulse: 125 122 115 114  Temp:    97.6 F (36.4 C)  TempSrc:    Oral  Resp: 10 9 12 24   Height:  Weight:      SpO2: 100% 100% 96% 92%    Intake/Output Summary (Last 24 hours) at 07/31/14 1247 Last data filed at 07/31/14 1209  Gross per 24 hour  Intake 1702.5 ml  Output    925 ml  Net  777.5 ml   Filed Weights   07/29/14 0500 07/30/14 0400 07/31/14 0222  Weight: 61.6 kg (135 lb 12.9 oz) 63.9 kg (140 lb 14 oz) 64.5 kg (142 lb 3.2 oz)    Exam:   General:  Adult male with frequent cough productive of bright red hemoptysis suctioning about a tablespoon of blood with each cough, + SCM and subcostal retractions and tachypnea  HEENT:  NCAT, MMM  Cardiovascular:  Tachycardic RR, nl S1, S2 no mrg, 2+ pulses, warm extremities  Respiratory:  rhonchorous bilateral breath sounds, diminished at the right base with rales to the right apex and left base, no  wheezes, moderate increased WOB  Abdomen:   NABS, soft, NT/ND  MSK:   Normal tone and bulk, 2+ pitting bilateral LEE  Neuro:  Grossly intact  Data Reviewed: Basic Metabolic Panel:  Recent Labs Lab 07/05/2014 1340 07/29/14 0350 07/30/14 0343 07/31/14 0430  NA 124* 125* 132* 135  K 4.2 4.1 3.8 3.6  CL 96* 99* 103 106  CO2 21* 18* 20* 21*  GLUCOSE 77 80 126* 129*  BUN 37* 42* 52* 52*  CREATININE 0.55* 0.78 0.65 0.63  CALCIUM 7.4* 7.2* 7.4* 7.1*  MG  --   --   --  2.0   Liver Function Tests:  Recent Labs Lab 07/27/2014 1340  AST 17  ALT 24  ALKPHOS 51  BILITOT 0.7  PROT 4.0*  ALBUMIN 1.9*    Recent Labs Lab 07/18/2014 1340  LIPASE 19*   No results for input(s): AMMONIA in the last 168 hours. CBC:  Recent Labs Lab 07/12/2014 1340 07/29/14 0350 07/30/14 0343 07/30/14 1636 07/31/14 0430  WBC 9.2 7.7 7.0  --  7.5  NEUTROABS 8.1*  --   --   --   --   HGB 6.3* 8.6* 6.3* 7.9* 7.0*  HCT 18.1* 25.0* 18.4* 22.6* 19.6*  MCV 91.0 88.7 90.2  --  81.3  PLT 45* 51* 32*  --  40*   Cardiac Enzymes: No results for input(s): CKTOTAL, CKMB, CKMBINDEX, TROPONINI in the last 168 hours. BNP (last 3 results)  Recent Labs  07/11/2014 1340  BNP 55.8    ProBNP (last 3 results) No results for input(s): PROBNP in the last 8760 hours.  CBG: No results for input(s): GLUCAP in the last 168 hours.  Recent Results (from the past 240 hour(s))  Body fluid culture     Status: None (Preliminary result)   Collection Time: 07/24/2014  6:00 PM  Result Value Ref Range Status   Specimen Description PLEURAL RIGHT  Final   Special Requests Normal  Final   Gram Stain   Final    FEW WBC PRESENT, PREDOMINANTLY PMN NO ORGANISMS SEEN Performed at [facility]    Culture   Final    NO GROWTH 2 DAYS Performed at [facility]    Report Status PENDING  Incomplete     Studies: Ct Biopsy  07/31/2014   CLINICAL DATA:  Thrombocytopenia.  Metastatic adenocarcinoma.  EXAM: CT  GUIDED DEEP ILIAC BONE ASPIRATION AND CORE BIOPSY  TECHNIQUE: The procedure, risks (including but not limited to bleeding, infection, organ damage ), benefits, and alternatives were explained to the patient. Questions regarding the  procedure were encouraged and answered. The patient understands and consents to the procedure. Patient was placed supine on the CT gantry and limited axial scans through the pelvis were obtained. Appropriate skin entry site was identified. Skin site was marked, prepped with Betadine, draped in usual sterile fashion, and infiltrated locally with 1% lidocaine.  Intravenous Fentanyl and Versed were administered as conscious sedation during continuous cardiorespiratory monitoring by the radiology RN, with a total moderate sedation time of 6 minutes.  Under CT fluoroscopic guidance an 11-gauge Cook trocar bone needle was advanced into the right iliac bone just lateral to the sacroiliac joint. Once needle tip position was confirmed, core and aspiration samples were obtained. The final sample was obtained using the guiding needle itself, which was then removed. Post procedure scans show no hematoma or fracture. Patient tolerated procedure well.  COMPLICATIONS: COMPLICATIONS none  IMPRESSION: 1. Technically successful CT guided right iliac bone core and aspiration biopsy.   Electronically Signed   By: Lucrezia Europe M.D.   On: 07/31/2014 11:46   Dg Chest Port 1 View  07/31/2014   CLINICAL DATA:  Shortness of Breath  EXAM: PORTABLE CHEST - 1 VIEW  COMPARISON:  07/30/2014  FINDINGS: Cardiomediastinal silhouette is stable. There is elevation of the right hemidiaphragm. Persistent masslike consolidation in right upper lobe. Streaky interstitial prominence and airspace opacification right lung is unchanged from prior exam. Interstitial tumor spread or pneumonia cannot be excluded. Small residual right pleural effusion. Left lung is clear. There is no pneumothorax.  IMPRESSION: There is elevation of the  right hemidiaphragm. Persistent masslike consolidation in right upper lobe. Streaky interstitial prominence and airspace opacification right lung is unchanged from prior exam. Interstitial tumor spread or pneumonia cannot be excluded. Small residual right pleural effusion. Left lung is clear.   Electronically Signed   By: Lahoma Crocker M.D.   On: 07/31/2014 08:02   Dg Chest Port 1 View  07/30/2014   CLINICAL DATA:  Status post right thoracentesis. History of metastatic adenocarcinoma of the lung.  EXAM: PORTABLE CHEST - 1 VIEW  COMPARISON:  Chest x-ray earlier today at 1356 hours  FINDINGS: Diminished right pleural fluid volume noted since the prior chest x-ray. Following thoracentesis, no pneumothorax is evident and there is some improved aeration at the right lung base. Mass-like opacity in the right upper lung and significant interstitial opacity throughout the remaining right lung appears stable and are suspicious for tumor with potential lymphangitic spread. Some of these changes may also relate to radiation therapy. Component of pneumonia may be present. No edema identified. The left lung shows stable basilar opacity likely representing atelectasis. PICC line extends into the lower SVC.  IMPRESSION: No pneumothorax following right-sided thoracentesis. There is improved aeration at the right lung base. Extensive pulmonary abnormalities on the right again identified suspicious for tumor with lymphangitic spread and/or radiation therapy changes. Component of pneumonia may be present.   Electronically Signed   By: Aletta Edouard M.D.   On: 07/30/2014 18:05   Dg Chest Port 1 View  07/30/2014   CLINICAL DATA:  Abnormal lung sounds. Shortness of breath, congestion, and cough.  EXAM: PORTABLE CHEST - 1 VIEW  COMPARISON:  07/29/2014  FINDINGS: Right PICC has been placed and terminates over the lower SVC. Cardiac silhouette is partially obscured, although the cardiomediastinal silhouette is grossly unchanged. Right  upper lobe consolidation appears slightly more confluent than on the prior study. Patchy consolidation and coarse reticular opacities in the right midlung and right lung base  have also mildly increased. Left basilar opacities do not appear significantly changed. Moderate right pleural effusion appears larger than on the prior study, although some of this could be due to differences in patient positioning. Small left pleural effusion is similar to the prior study. No pneumothorax is identified.  IMPRESSION: 1. Worsening right lung opacities consistent with pneumonia. 2. Likely increased size of moderate right pleural effusion. Unchanged small left pleural effusion.   Electronically Signed   By: Logan Bores   On: 07/30/2014 14:17   US Thoracentesis Asp Pleural Space W/img Guide  07/30/2014   CLINICAL DATA:  Right Pleural Effusion  EXAM: ULTRASOUND GUIDED Right THORACENTESIS  COMPARISON:  None.  PROCEDURE: An ultrasound guided thoracentesis was thoroughly discussed with the patient and questions answered. The benefits, risks, alternatives and complications were also discussed. The patient understands and wishes to proceed with the procedure. Written consent was obtained.  Ultrasound was performed to localize and mark an adequate pocket of fluid in the Right chest. The area was then prepped and draped in the normal sterile fashion. 1% Lidocaine was used for local anesthesia. Under ultrasound guidance a 19 gauge Yueh catheter was introduced. Thoracentesis was performed. The catheter was removed and a dressing applied.  COMPLICATIONS: None.  FINDINGS: A total of approximately 600 mls of Dark Bloody fluid was removed. A fluid sample was saved and will besent for laboratory analysis per ordering physician.  IMPRESSION: Successful ultrasound guided Right thoracentesis yielding 600 mls of pleural fluid.  Read by:  Gareth Eagle, PA-C   Electronically Signed   By: Jerilynn Mages.  Shick M.D.   On: 07/30/2014 17:13    Scheduled Meds: .  sodium chloride   Intravenous Once  . sodium chloride   Intravenous Once  . antiseptic oral rinse  15 mL Mouth Rinse 6 X Daily  . cefTAZidime (FORTAZ)  IV  1 g Intravenous Q8H  . docusate sodium  200 mg Oral Daily  . feeding supplement (ENSURE ENLIVE)  237 mL Oral BID BM  . ipratropium-albuterol  3 mL Nebulization Q4H  . methimazole  60 mg Oral Daily  . methylPREDNISolone (SOLU-MEDROL) injection  60 mg Intravenous Q6H  . metronidazole  500 mg Intravenous Q8H  . OxyCODONE  15 mg Oral Q12H  . pantoprazole  40 mg Oral BID  . sodium chloride  1,000 mL Intravenous Once  . sodium chloride  10-40 mL Intracatheter Q12H  . vancomycin  1,000 mg Intravenous Q12H   Continuous Infusions:    Active Problems:   Pneumonia, organism unspecified   Rheumatoid arthritis   Hypertension   Thyroid enlargement   Anemia   Pleural effusion   HCAP (healthcare-associated pneumonia)   Acute respiratory failure   Hemoptysis   Malnutrition of moderate degree   Thrombocytopenia   Malignant pleural effusion    Time spent: 30 min    Demontrae Gilbert, [facility]  [facility] Pager [phone]. If 7PM-7AM, please contact night-coverage at [email or web address], password [facility] 07/31/2014, 12:47 PM  LOS: 3 days

## 2014-07-31 NOTE — Sedation Documentation (Signed)
NS Fluid bolus infusing.

## 2014-07-31 NOTE — Sedation Documentation (Signed)
200cc Total NS Fluid bolus.

## 2014-07-31 NOTE — Procedures (Signed)
CT-guided  R iliac bone marrow aspiration and core biopsy No complication No blood loss. See complete dictation in [facility] PACS  

## 2014-07-31 NOTE — Significant Event (Signed)
PCCM Interval Progress Note  Called by Warren Lacy MD and asked to assess pt at bedside for respiratory distress.  74 y.o. M with newly diagnosed metastatic adenocarcinoma of uncertain etiology admitted 5/30 with acute hypoxemic respiratory failure, malignant right pleural effusion s/p thoracentesis and HCAP.  Per RN, pt has been more somnolent and lethargic through the evening.  ABG was obtained and revealed respiratory acidosis / hypoxemic and hypercarbic respiratory failure.  Pt not able to be aroused at bedside and gurgling with posterior pharyngeal bloody secretions noted.  NTS attempted but not very successful.   Son called and asked to come to hospital.  Chart reviewed and multiple conversations regarding goals of care / code status noted.  I had extensive prolonged discussion with him at bedside (for about 50 - 60 minutes) regarding code status.  Son not able to make decision to change to DNR and hoping that his father can live through June 20th when his mother is hoping to return from [place].  I explained intubation and the risks of possibly not being able to extubate (with subsequent trach / PEG and nursing home).  Son emotional and tearful, still requesting full code and hoping that father can be extubated in next few days.  I'm not quite sure that son fully understands the situation and what intubation means etc (ie. He thinks that pt will be able to talk after being intubated).   VITAL SIGNS: Temp:  [97.3 F (36.3 C)-98.3 F (36.8 C)] 97.8 F (36.6 C) (06/02 2026) Pulse Rate:  [37-129] 96 (06/02 2200) Resp:  [9-44] 33 (06/02 2200) BP: (85-180)/(52-108) 144/79 mmHg (06/02 2200) SpO2:  [78 %-100 %] 90 % (06/02 2200) FiO2 (%):  [80 %-100 %] 100 % (06/02 1900) Weight:  [64.5 kg (142 lb 3.2 oz)] 64.5 kg (142 lb 3.2 oz) (06/02 0222)  PHYSICAL EXAM: General:  Adult thin asian male, in respiratory distress. Neuro:  Somnolent.  Withdraws to pain.  Does not  Cardiovascular:  Tachy,  regular. Lungs:  Coarse bilaterally.  Diminished in right base. Abdomen:  BS x 4.  Soft, NT/ND.  Musculoskeletal:  No edema.   IMPRESSION / PLAN:  Acute hypoxemic and hypercarbic respiratory failure Inability to protect airway Probable aspiration Plan: Intubate now. Full vent support, wean FiO2 as able. VAP bundle. ABG in 1 hour. Continue empiric abx. Obtain tracheal aspirate. CXR in AM.  Post sedation hypotension and bradycardia - has persisted despite fluid boluses, 30mg Neosynephrine push, '1mg'$  epinephrine. Plan: Levophed to maintain goal MAP > 65.  Acute metabolic encephalopathy Sedation: Fentanyl gtt. RASS goal:  0 to -1. Daily WUA.  Additional CC time:  60 minutes.   RMontey Hora PCohuttaPulmonary & Critical Care Medicine Pgr: ([phone] or ([phone]/03/2014, 11:38 PM

## 2014-07-31 NOTE — Progress Notes (Addendum)
Date:  July 31, 2014 U.R. performed for needs and level of care. Will continue to follow for Case Management needs.  Patient remains hypotensive and on 80% fi02 receiving transfusions today.  hgb 6.3 Velva Harman, RN, BSN, [place]   [phone]

## 2014-08-01 ENCOUNTER — Inpatient Hospital Stay (HOSPITAL_COMMUNITY): Payer: BC Managed Care – PPO

## 2014-08-01 ENCOUNTER — Other Ambulatory Visit: Payer: BC Managed Care – PPO

## 2014-08-01 ENCOUNTER — Ambulatory Visit: Payer: BC Managed Care – PPO | Admitting: Hematology & Oncology

## 2014-08-01 ENCOUNTER — Ambulatory Visit: Payer: BC Managed Care – PPO

## 2014-08-01 DIAGNOSIS — J90 Pleural effusion, not elsewhere classified: Secondary | ICD-10-CM

## 2014-08-01 DIAGNOSIS — R031 Nonspecific low blood-pressure reading: Secondary | ICD-10-CM

## 2014-08-01 DIAGNOSIS — R6521 Severe sepsis with septic shock: Secondary | ICD-10-CM

## 2014-08-01 DIAGNOSIS — A419 Sepsis, unspecified organism: Secondary | ICD-10-CM | POA: Insufficient documentation

## 2014-08-01 LAB — BLOOD GAS, ARTERIAL
ACID-BASE DEFICIT: 9.5 mmol/L — AB (ref 0.0–2.0)
BICARBONATE: 17.2 meq/L — AB (ref 20.0–24.0)
Drawn by: 31814
FIO2: 1 %
LHR: 16 {breaths}/min
MECHVT: 530 mL
O2 Saturation: 98.2 %
PATIENT TEMPERATURE: 35.8
PCO2 ART: 41.3 mmHg (ref 35.0–45.0)
PEEP: 5 cmH2O
PH ART: 7.234 — AB (ref 7.350–7.450)
PO2 ART: 114 mmHg — AB (ref 80.0–100.0)
TCO2: 16.9 mmol/L (ref 0–100)

## 2014-08-01 LAB — TYPE AND SCREEN
ABO/RH(D): B POS
ANTIBODY SCREEN: NEGATIVE
UNIT DIVISION: 0
UNIT DIVISION: 0
UNIT DIVISION: 0
Unit division: 0
Unit division: 0
Unit division: 0

## 2014-08-01 LAB — CBC
HCT: 24.8 % — ABNORMAL LOW (ref 39.0–52.0)
Hemoglobin: 8.1 g/dL — ABNORMAL LOW (ref 13.0–17.0)
MCH: 27.4 pg (ref 26.0–34.0)
MCHC: 32.7 g/dL (ref 30.0–36.0)
MCV: 83.8 fL (ref 78.0–100.0)
PLATELETS: 46 10*3/uL — AB (ref 150–400)
RBC: 2.96 MIL/uL — AB (ref 4.22–5.81)
RDW: 18.6 % — AB (ref 11.5–15.5)
WBC: 10.7 10*3/uL — ABNORMAL HIGH (ref 4.0–10.5)

## 2014-08-01 LAB — BASIC METABOLIC PANEL
Anion gap: 8 (ref 5–15)
BUN: 60 mg/dL — ABNORMAL HIGH (ref 6–20)
CO2: 20 mmol/L — AB (ref 22–32)
CREATININE: 0.76 mg/dL (ref 0.61–1.24)
Calcium: 6.7 mg/dL — ABNORMAL LOW (ref 8.9–10.3)
Chloride: 107 mmol/L (ref 101–111)
GFR calc Af Amer: >60 mL/min (ref >60)
Glucose, Bld: 158 mg/dL — ABNORMAL HIGH (ref 65–99)
Potassium: 3.8 mmol/L (ref 3.5–5.1)
SODIUM: 135 mmol/L (ref 135–145)

## 2014-08-01 LAB — PREPARE PLATELET PHERESIS: Unit division: 0

## 2014-08-01 LAB — BODY FLUID CULTURE
CULTURE: NO GROWTH
SPECIAL REQUESTS: NORMAL

## 2014-08-01 LAB — PREPARE RBC (CROSSMATCH)

## 2014-08-01 LAB — GLUCOSE, CAPILLARY
GLUCOSE-CAPILLARY: 143 mg/dL — AB (ref 65–99)
Glucose-Capillary: 159 mg/dL — ABNORMAL HIGH (ref 65–99)

## 2014-08-01 LAB — PHOSPHORUS: Phosphorus: 4.1 mg/dL (ref 2.5–4.6)

## 2014-08-01 LAB — MAGNESIUM: MAGNESIUM: 2.2 mg/dL (ref 1.7–2.4)

## 2014-08-01 MED ORDER — VITAL HIGH PROTEIN PO LIQD: 1000.0000 mL | ORAL | Status: DC

## 2014-08-01 MED ORDER — SODIUM CHLORIDE 0.9 % IV SOLN
Freq: Once | INTRAVENOUS | Status: AC
  Administered 2014-08-01: 17:00:00 via INTRAVENOUS

## 2014-08-01 MED ORDER — VITAL AF 1.2 CAL PO LIQD
1000.0000 mL | ORAL | Status: DC
  Administered 2014-08-01: 1000 mL
  Filled 2014-08-01 (×4): qty 1000

## 2014-08-01 MED FILL — Medication: Qty: 1 | Status: AC

## 2014-08-01 NOTE — Progress Notes (Signed)
Chaplain visited patient and he was resting. Patient's son appeared to be anxious. Chaplain talked to son for a few minutes.  I told him that I had come by to see his father on yesterday and saw his wife.   08/01/14 1400  Clinical Encounter Type  Visited With Patient and family together  Visit Type Follow-up;Spiritual support;Social support  Referral From Palliative care team  He thanked me for coming to see his father.

## 2014-08-01 NOTE — Progress Notes (Signed)
Unfortunately, the situation has changed. 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 He now is on pressor support.  His son is very distraught over this. I talked to him for about 45 minutes this morning. This definitely his a problem that is going to be incredibly difficult to overcome.  I told his son that I just do not think that chemotherapy could be given as Dr. Lyda [name] body is just too weak to tolerate it and his blood pressure is just not high enough to be able to handle it. I told his son that as long as he is on blood pressure support medicine, chemotherapy really cannot work. His son was wondering if the pressor medication could be stopped so that chemotherapy can be given. I told him that this was impossible to do because the pressor support medication is probably what is sustaining his father right now.  His hemoglobin is 8.1. We could try 2 units of blood to see if that helps his blood pressure any.  His son somehow wants to get his father fed. I think that he has a orogastric tube in place. If that is the case, then possibly he can get enteral feeds. If not, then I don't know if TNA would help.  One of the issues that we have is that Dr. Lyda [name] wife will not be over that make it back from [place] until June 20. That is still whole long way to go. His son wants to try to keep him alive by all measures necessary so that Dr. Lyda [name] wife can be here.  He really does not respond well to platelets. We gave him some platelets yesterday and is planning to count went from 40,000 up to 46,000. 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  I just feel very bad for his son. His son is incredibly dedicated to his father. A lot of this has to do with the fact that Dr. [name] left his family in [place] when his son was 12 years old. They reunited about 25 years later. As such, his son just does not feel that he has had enough time to be with his father and enjoy that father/son  relationship. I can understand the pain that he is going through right now. I can also understand why he wants to pursue all measures to try to keep his father alive.  I think that as long as he is on pressor support, we really are not able to use any chemotherapy. I does don't think chemotherapy will be effective as there will be such significant vasoconstriction that will minimize chemotherapy delivery to the affected areas  I don't know if he needs a Pleurx catheter placed into the right pleural space if he has recurrence of his pleural effusion. The cytology on the effusion is positive for malignant cells.  Overall, this is just a very tough and very delicate situation.  I will certainly do what I can do try to help his son understand the severity of the situation.  Transfusing Dr. [name] and somehow feeding him will go a LONG way in helping his son see that everybody is doing as much as they can to help his father.  [name] [name] 1:5-7

## 2014-08-01 NOTE — Progress Notes (Signed)
PULMONARY / CRITICAL CARE MEDICINE   Name: [name] [name] MRN: [id number] DOB: 1940-08-03    ADMISSION DATE:  07/17/2014 CONSULTATION DATE:  07/12/2014  REFERRING MD :  EDP  CHIEF COMPLAINT:  Shortness of breath, hemoptysis  INITIAL PRESENTATION: 74 year old male with newly diagnosed metastatic adenocarcinoma of uncertain origin was admitted through the [facility] emergency department on 07/12/2014 for acute hypoxemic respiratory failure, hemoptysis, and a large right pleural effusion.  STUDIES:  5/30  CT Chest >> Right upper lobe consolidation with local interstitial infiltration significantly increased from previous, large right-sided pleural effusion, moderate-sized left-sided pleural effusion, improved neck lymphadenopathy compared to May 5 study  SIGNIFICANT EVENTS: 5/30  Thoracentesis >> 1500 mL of very bloody fluid removed- cytology POS 6/02  Afebrile, decreased platelets, 100% NRB, hemoptysis  6/03  Early am intubation by anesthesia.  Levophed.  Extensive discussion with family regarding patients status (see below)  SUBJECTIVE:  Events from early am noted, pt intubated by anesthesia, requiring pressors.  2 Units PRBC's planned per ONC   VITAL SIGNS: Temp:  [96.6 F (35.9 C)-98.6 F (37 C)] 98.6 F (37 C) (06/03 0800) Pulse Rate:  [37-129] 75 (06/03 0800) Resp:  [9-44] 16 (06/03 0800) BP: (81-180)/(44-108) 81/44 mmHg (06/03 0800) SpO2:  [78 %-100 %] 100 % (06/03 0800) FiO2 (%):  [60 %-100 %] 60 % (06/03 0837) Weight:  [152 lb 12.5 oz (69.3 kg)] 152 lb 12.5 oz (69.3 kg) (06/03 0440)   HEMODYNAMICS:     VENTILATOR SETTINGS: Vent Mode:  [-] PRVC FiO2 (%):  [60 %-100 %] 60 % Set Rate:  [16 bmp] 16 bmp Vt Set:  [530 mL] 530 mL PEEP:  [5 cmH20] 5 cmH20 Plateau Pressure:  [20 cmH20-24 cmH20] 23 cmH20   INTAKE / OUTPUT:  Intake/Output Summary (Last 24 hours) at 08/01/14 1030 Last data filed at 08/01/14 4098  Gross per 24 hour  Intake 3336.04 ml  Output    500 ml  Net  2836.04 ml    PHYSICAL EXAMINATION: General:  Profoundly weak and pale appearing Asian male lying in bed  Neuro:  Sedate, opens eyes to name, nods appropriately to yes / no questions HEENT:  OETT, bloody secretions from sub-glottic suctioning, neck masses improved Cardiovascular:  Regular rate and rhythm no systolic murmur Lungs:  Even/non-labored on vent, lungs bilaterally diminished R>L, few crackles Abdomen:  Soft, nontender Musculoskeletal:  Significant atrophy of the right leg musculature throughout Skin:  Bruising over the right chest wall adjacent to radiation marking tattoos  LABS:  CBC  Recent Labs Lab 07/30/14 0343 07/30/14 1636 07/31/14 0430 08/01/14 0345  WBC 7.0  --  7.5 10.7*  HGB 6.3* 7.9* 7.0* 8.1*  HCT 18.4* 22.6* 19.6* 24.8*  PLT 32*  --  40* 46*   Coag's  Recent Labs Lab 07/30/14 1300  APTT 31  INR 1.26    BMET  Recent Labs Lab 07/30/14 0343 07/31/14 0430 08/01/14 0345  NA 132* 135 135  K 3.8 3.6 3.8  CL 103 106 107  CO2 20* 21* 20*  BUN 52* 52* 60*  CREATININE 0.65 0.63 0.76  GLUCOSE 126* 129* 158*   Electrolytes  Recent Labs Lab 07/30/14 0343 07/31/14 0430 08/01/14 0345  CALCIUM 7.4* 7.1* 6.7*  MG  --  2.0 2.2  PHOS  --   --  4.1   Sepsis Markers No results for input(s): LATICACIDVEN, PROCALCITON, O2SATVEN in the last 168 hours.   ABG  Recent Labs Lab 07/30/14 1546 07/31/14 2136 08/01/14  0034  PHART 7.301* 7.160* 7.234*  PCO2ART 38.3 57.3* 41.3  PO2ART 44.1* 64.2* 114*    Liver Enzymes  Recent Labs Lab 07/03/2014 1340  AST 17  ALT 24  ALKPHOS 51  BILITOT 0.7  ALBUMIN 1.9*   Cardiac Enzymes No results for input(s): TROPONINI, PROBNP in the last 168 hours.   Glucose  Recent Labs Lab 08/01/14 0003  GLUCAP 159*    Imaging Ct Biopsy  07/31/2014   CLINICAL DATA:  Thrombocytopenia.  Metastatic adenocarcinoma.  EXAM: CT GUIDED DEEP ILIAC BONE ASPIRATION AND CORE BIOPSY  TECHNIQUE: The procedure, risks  (including but not limited to bleeding, infection, organ damage ), benefits, and alternatives were explained to the patient. Questions regarding the procedure were encouraged and answered. The patient understands and consents to the procedure. Patient was placed supine on the CT gantry and limited axial scans through the pelvis were obtained. Appropriate skin entry site was identified. Skin site was marked, prepped with Betadine, draped in usual sterile fashion, and infiltrated locally with 1% lidocaine.  Intravenous Fentanyl and Versed were administered as conscious sedation during continuous cardiorespiratory monitoring by the radiology RN, with a total moderate sedation time of 6 minutes.  Under CT fluoroscopic guidance an 11-gauge Cook trocar bone needle was advanced into the right iliac bone just lateral to the sacroiliac joint. Once needle tip position was confirmed, core and aspiration samples were obtained. The final sample was obtained using the guiding needle itself, which was then removed. Post procedure scans show no hematoma or fracture. Patient tolerated procedure well.  COMPLICATIONS: COMPLICATIONS none  IMPRESSION: 1. Technically successful CT guided right iliac bone core and aspiration biopsy.   Electronically Signed   By: Lucrezia Europe M.D.   On: 07/31/2014 11:46   Portable Chest Xray  08/01/2014   CLINICAL DATA:  Hypoxia  EXAM: PORTABLE CHEST - 1 VIEW  COMPARISON:  Study obtained earlier in the day.  FINDINGS: Endotracheal tube tip is 3.3 cm above the carina. Central catheter tip is at the cavoatrial junction. No pneumothorax. Moderate effusion remains on the right with extensive airspace disease throughout the right lung. There is patchy consolidation in the left base with small left effusion. Heart size and pulmonary vascularity are within normal limits. There is atherosclerotic change in the aorta. No adenopathy.  IMPRESSION: Tube and catheter positions as described without pneumothorax.  Persistent airspace disease in effusion on the right with a lesser degree of consolidation in the left base. Small left effusion. No change in cardiac silhouette.   Electronically Signed   By: Lowella Grip III M.D.   On: 08/01/2014 07:02   Portable Chest Xray  08/01/2014   CLINICAL DATA:  Endotracheal tube placement.  Initial encounter.  EXAM: PORTABLE CHEST - 1 VIEW  COMPARISON:  Chest radiograph performed 07/31/2014  FINDINGS: The patient's endotracheal tube is seen ending 5 cm above the carina. A right PICC is noted ending about the mid SVC.  There is a small to moderate right-sided pleural effusion, with diffuse right-sided airspace opacification. This may reflect asymmetric pulmonary edema or pneumonia. As previously noted, interstitial spread of tumor could have a similar appearance. The left lung appears relatively clear. The left costophrenic angle is incompletely imaged on this study.  The cardiomediastinal silhouette is borderline normal in size, though not well assessed due to right-sided airspace opacification. No acute osseous abnormalities are seen.  IMPRESSION: 1. Endotracheal tube seen ending 5 cm above the carina. 2. Small to moderate right-sided pleural effusion, somewhat  increased from the recent prior study, with diffuse right-sided airspace opacification. This may reflect asymmetric pulmonary edema or pneumonia. As previously noted, interstitial spread of tumor could have a similar appearance.   Electronically Signed   By: Garald Balding M.D.   On: 08/01/2014 00:28     ASSESSMENT / PLAN:  PULMONARY  A:  1.  Acute Hypoxemic Respiratory Failure - right upper lobe consolidation seen on the CT chest is consistent with malignancy / lymphangitic spread  2. Hemoptysis - secondary to thrombocytopenia / cancer  3. Malignant R Pleural Effusion - Very bloody, 1500 ml pleural fluid removed 5/30, this is most likely due to pleural metastasis bleeding in the setting of  thrombocytopenia  P:   Continue MV support, 8 cc/kg Wean PEEP/FiO2 for saturations > 90% Intermittent CXR Would not recommend pleurX as hypoxemia is likely related to lymphangitic spread  CARDIOVASCULAR A:  Sinus tachycardia - secondary to bleeding Hypotension - progression of disease state/critical illness + sedation P:  Telemetry monitoring Hold blood pressure meds for now Levophed to maintain MAP > 65 Continue solu-medrol 69m Q6  RENAL A:   Hyponatremia ? Of malignancy P:   Monitor BMET and UOP Replace electrolytes as needed NS @ 100 ml/hr  GASTROINTESTINAL A:   Heme positive stool but clinically not bleeding from GI tract P:   Monitor for bleeding Place OGT Begin TF  HEMATOLOGIC A:   Hemorrhagic anemia Thrombocytopenia - likely secondary to bleeding and/or radiation treatment DVT left subclavian vein Adenocarcinoma of uncertain etiology, EGFR neg P:  Transfuse PRBC for Hb < 7 Transfuse platelets for 50k or active bleeding  No anticoagulation considering bleeding and thrombocytopenia Dr. 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 EMarin Olpfollowing for planned chemo / XRT    FAMILY  - Updates:  Spoke with Son and his wife at length 6/3.  Palliative Care & Hospitalist MD present during discussion. Reviewed entire case with family.  The son feels the hospital discharged him too quickly the last admit and that has led to his demise.  He is fixated on  feeding the patient and that we have a cure that we possibly are not offering.  Unfortunately, he was intubated overnight and there was mention of a tracheostomy during that process and also that at 7 days we may consider taking the tube out.  He wants to know what the "overall plan is for when that time comes".  He states he feels responsible for getting his mother here to see his father before he dies but there has been difficulty with transportation timing ([place]  He also thinks we can turn off the vasopressors to allow him to do chemotherapy.  I and the other clinicians spent approximately 45 minutes + with the family in a very circular discussion.  He has concerns we are not communicating with each other as effectively as we  could.  We informed him we are offering and doing all we can to support his father but that despite all of this support he will likely pass away.  His overall prognosis appears very poor considering his malnourished state, profound weakness, right leg neurologic weakness / atrophy, metastatic malignancy, and acute illness.  Likely days that he may survive.  They continue to want to seek out any measure possible to cure his father.  He compares cure to other cancer patients and experiences had with healing in [place].  He even told us that we "have the power of God".     CC Time: 82 minutes    Noe Gens, NP-C [facility] Pgr: [phone] or [phone] 08/01/2014, 10:30 AM

## 2014-08-01 NOTE — Progress Notes (Signed)
CSW is following to assist with d/c planning needs. PN reviewed. Daily updates provided to [facility]. SNF liaison continues to visit pt/family. CSW will remain available to assist as needed.  Werner Lean LCSW [phone]

## 2014-08-01 NOTE — Progress Notes (Addendum)
Nutrition Follow-up  DOCUMENTATION CODES:  Non-severe (moderate) malnutrition in context of chronic illness as evidenced by moderate fat and muscle depletion.   INTERVENTION: Initiate Vital AF 1.2 @ 20 ml/hr via OGT and increase by 10 ml every 4 hours to goal rate of 55 ml/hr.   Tube feeding regimen provides 1607 kcal (101% of needs), 99 grams of protein, and 1069 ml of H2O.   RD will continue to monitor  NUTRITION DIAGNOSIS:  Increased nutrient needs related to cancer and cancer related treatments as evidenced by estimated needs.  ongoing  GOAL:  Patient will meet greater than or equal to 90% of their needs  Not met  MONITOR:  PO intake, Weight trends, Supplement acceptance, Labs  REASON FOR ASSESSMENT:  Consult Enteral/tube feeding initiation and management  ASSESSMENT: 74 year old male with newly diagnosed metastatic adenocarcinoma of uncertain origin was admitted through the [place] emergency department on 07/16/2014 for acute hypoxemic respiratory failure, hemoptysis, and a large right pleural effusion.  - Pt intubated 6/2. OGT placed this am.  - Verbal consult received to start TF.  - Pt unable to tolerate chemo at this time due to low blood pressure. - Poor prognosis. Family wishes for full supportive measures until wife arrives from [place] (June 20th.) - Good po prior to intubation.   Patient is currently intubated on ventilator support MV: 9.4 L/min Temp (24hrs), Avg:97.7 F (36.5 C), Min:96.6 F (35.9 C), Max:98.6 F (37 C)  Propofol: none  - Labs and medications reviewed - RD will continue to monitor.   Height:  Ht Readings from Last 1 Encounters:  07/14/2014 _0  (1.702 m)    Weight:  Wt Readings from Last 1 Encounters:  08/01/14 152 lb 12.5 oz (69.3 kg)    Ideal Body Weight:  67.3 kg  Wt Readings from Last 10 Encounters:  08/01/14 152 lb 12.5 oz (69.3 kg)  07/22/14 138 lb (62.596 kg)  07/10/14 138 lb 3.7 oz (62.7 kg)  01/14/14  147 lb 3.2 oz (66.769 kg)  12/11/13 147 lb 4.3 oz (66.8 kg)  12/05/13 148 lb 1.6 oz (67.178 kg)  11/29/13 149 lb (67.586 kg)  11/19/13 146 lb 11.2 oz (66.543 kg)  11/12/13 145 lb (65.772 kg)  08/25/11 140 lb (63.504 kg)    BMI:  Body mass index is 23.92 kg/(m^2).  Estimated Nutritional Needs:  Kcal:  1584  Protein:  100-115 g  Fluid:  2.0 L/day  Skin:  Reviewed, no issues  Diet Order:  Diet NPO time specified  EDUCATION NEEDS:  Education needs addressed   Intake/Output Summary (Last 24 hours) at 08/01/14 1026 Last data filed at 08/01/14 0821  Gross per 24 hour  Intake 3336.04 ml  Output    500 ml  Net 2836.04 ml    Last BM:  6/2  Laurette Schimke Gil[place], [place], [place]

## 2014-08-01 NOTE — Progress Notes (Signed)
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  CT chest May 30: Right upper lobe consolidation with local interstitial infiltration significantly increased from previous, large right-sided pleural effusion, moderate-sized left-sided pleural effusion, improved neck lymphadenopathy compared to May 5 study.  He underwent thoracentesis with removal of 1541m of bloody fluid and continues to have hemoptysis with anemia and thrombocytopenia.  He has had repeated thoracenteses which have improved his dyspnea, however, he developed worsening respiratory distress requiring nonrebreather and subsequent intubation on 6/2. He was started on vasopressors.  He has very aggressive and widely metastatic adenocarcinoma that his obliterated his bone marrow causing anemia and thrombocytopenia. He has been on interval for possible infection however I have low suspicion that he actually has pneumonia and I am highly suspicious that all of his symptoms are due to progressive malignancy. He is no longer a candidate for chemotherapy at this time because he is on life support and is malnourished and is too sick to undergo treatment. He is going to succumb to his cancer probably within the next few days.  Over the last several days I have spent approximately 30-45 minutes with the patient and his son and daughter-in-law. There is a cultural barrier. The son has repeatedly reiterated that he did not want his father to give up hope and that he wants his father to remain alive until June 20. He states that the physicians are like God in that we have the power to keep his father alive until that  time if we communicate well and work together. We have repeatedly stated that we are doing everything possible to assist his father including life support at the family's request despite the fact that we have recommended against life support and for transitioning to comfort measures, DO NOT RESUSCITATE/DO NOT INTUBATE, since this care is futile.  The son seems surprised yesterday when his father became more ill and required intubation. He felt that we should have held off on intubating his father and does not seem to understand that it was necessary in order to respect the family's wishes of prolonging life at the expense of comfort. He feels that his father has become ill because he was discharged to early the last time he was hospitalized and that he received poor care at a skilled nursing facility. He also feels that nutrition is critical to his father success and thinks that because we had his father remain nothing by mouth for 18 hours that he has become much weaker and that we have caused him to become sicker and possibly die. He does not think that the patient's cancer is what is ultimately causing his progressive decline because he has had friends who have had metastatic cancer that improved with chemotherapy and specialized treatments. He feels that we are responsible for his father's illness and that somehow we are not doing what we are supposed to in order to make his father better. He perseverates on the fact that his father was well appearing and traveling just a month ago and therefore he should not be this sick now.  Multiple staff members including MWadie Lessenfrom palliative care, BNoe Gensfrom pulmonary critical care, myself, and Dr. EMarin Olpfrom oncology have tried  to understand the patient and his son's goals of care in order to respect them, however he has contradictory ideas about what the medical staff should do in this regard. For example, yesterday, he stated that he wanted his father  to stay alive as long as possible and when I explained that that meant receiving nutrition via alternate means to prevent aspiration risk and worsening respiratory distress, he demanded that his father be allowed to eat regularly. I spent greater than 30 minutes on this issue without success and ultimately allowed his father to eat because I felt this was the compassionate thing to do in the setting of this rapidly progressive and soon to be fatal malignancy.  We have also tried to explain the meaning of life support and the extent of the patient's cancer, but the patient's son frequently interrupts and states that we misunderstand him and that he does not believe [place]. There is very clear mistrust. I have offered to communicate more directly with Dr. Marin Olp and have agreed to be the point person for family communications for today and tomorrow in order to prevent miscommunications despite the fact that this patient has transitioned to the critical care service.   Assessment/Plan  Widely metastatic adenocarcinoma of uncertain etiology, EGFR neg 5/13, with malignant pleural effusions and obliteration of the bone marrow -  PCCM, palliative care, myself, and oncology have recommended DNR/DNI since these efforts would be futile, however, family wants aggressive treatment -  Too sick to start chemotherapy at this point -  He will die an in-hospital death in the ICU either full code or with scheduled extubation for comfort measures -  Appreciate Dr. Marin Olp assistance  Acute Hypoxemic Respiratory Failure due to progressive lymphangitic spread of cancer with hemoptysis from thrombocytopenia and malignant pleural effusion.  Less likely healthcare associated pneumonia -  Continue vancomycin, fortaz + flagyl -  Pleural culture NGTD -  Mechanical ventilation and sedation management per PCCM  Hypotension due to progressive malignancy and sedation  -  Management per PCCM  Hemoptysis and malignant pleural  effusion secondary to thrombocytopenia and cancer and possible low level DIC -  Transfuse to keep platelets > 50K  -  1500 ml bloody pleural fluid removed 5/30 -  Pleural culture NGTD -  Cytology:  Positive for malignant cells -  Thoracentesis by IR on 6/1 with removal of 690m of fluid  Sinus tachycardia secondary to progressive cancer and symptomatic anemia  -  Transfuse to keep hgb >7  Hyponatremia resolved with blood transfusions  Heme positive stool but clinically not bleeding from GI tract:  Probably has swallowed some blood from his copious hemoptysis  Anemia and thrombocytopenia likely secondary to bleeding and marrow obliteration by malignancy -  Transfuse as needed to keep hgb > 8 and plt > 50K -  Transfuse 2 units PRBC and plt pack 6/1  -  Transfuse 2 units PRBC and plt pack on 6/2  DVT left subclavian vein via MRI 5/16 -  No anticoagulation considering bleeding and thrombocytopenia   Hyperthyroidism in setting of thyroid mass  -  Repeat fT4 mildly low probably due to severe illness, steroids -  Continue methimazole at current dose -  Dr. KBuddy Dutyunavailable until Monday  Pain in setting of thoracic metastasis and right leg weakness secondary to spinal cord compression -  Continue solumedrol  Moderate malnutrition:  Start tube feeds  Diet:  Tube feeds Access:  PIV IVF:  off Proph:  SCDs  Code Status: full Family  Communication: patient and son and daughter-in-law Disposition Plan:  Anticipate in-hospital death.    Consultants:  PCCM  Oncology, Dr. Marin Olp  IR  Procedures:  CT angio chest   Antibiotics:  Vancomycin 5/30 >  Zosyn 5/30 > 6/1  Ceftazidime 6/1 >  Flagyl 6/1 >   HPI/Subjective:  On ventilator.  Nods to say that his throat hurts, but indicates breathing is improved. He denies chest pains, abdominal pain, nausea.  Objective: Filed Vitals:   08/01/14 1245 08/01/14 1300 08/01/14 1315 08/01/14 1400  BP: 107/63 101/71 127/89 98/66   Pulse: 88 103 104 106  Temp: 99.1 F (37.3 C) 99.1 F (37.3 C) 99.3 F (37.4 C) 99.5 F (37.5 C)  TempSrc: Core (Comment)  Core (Comment)   Resp: _0 Height:      Weight:      SpO2: 99% 98% 95% 100%    Intake/Output Summary (Last 24 hours) at 08/01/14 1505 Last data filed at 08/01/14 1400  Gross per 24 hour  Intake 3964.37 ml  Output    375 ml  Net 3589.37 ml   Filed Weights   07/30/14 0400 07/31/14 0222 08/01/14 0440  Weight: 63.9 kg (140 lb 14 oz) 64.5 kg (142 lb 3.2 oz) 69.3 kg (152 lb 12.5 oz)    Exam:   General:  Adult male, intubated with bloody secretions and a suction catheter  HEENT:  NCAT, crusted blood on lips  Cardiovascular:  Tachycardic RR, nl S1, S2 no mrg, 2+ pulses, warm extremities  Respiratory:  rhonchorous bilateral breath sounds, diminished at the right base with rales to the right apex and left base, no wheezes, breathing appears much more comfortable on the ventilator  Abdomen:   NABS, soft, NT/ND  MSK:   Normal tone and bulk, 2+ pitting bilateral LEE  Data Reviewed: Basic Metabolic Panel:  Recent Labs Lab 07/01/2014 1340 07/29/14 0350 07/30/14 0343 07/31/14 0430 08/01/14 0345  NA 124* 125* 132* 135 135  K 4.2 4.1 3.8 3.6 3.8  CL 96* 99* 103 106 107  CO2 21* 18* 20* 21* 20*  GLUCOSE 77 80 126* 129* 158*  BUN 37* 42* 52* 52* 60*  CREATININE 0.55* 0.78 0.65 0.63 0.76  CALCIUM 7.4* 7.2* 7.4* 7.1* 6.7*  MG  --   --   --  2.0 2.2  PHOS  --   --   --   --  4.1   Liver Function Tests:  Recent Labs Lab 07/19/2014 1340  AST 17  ALT 24  ALKPHOS 51  BILITOT 0.7  PROT 4.0*  ALBUMIN 1.9*    Recent Labs Lab 07/24/2014 1340  LIPASE 19*   No results for input(s): AMMONIA in the last 168 hours. CBC:  Recent Labs Lab 07/05/2014 1340 07/29/14 0350 07/30/14 0343 07/30/14 1636 07/31/14 0430 08/01/14 0345  WBC 9.2 7.7 7.0  --  7.5 10.7*  NEUTROABS 8.1*  --   --   --   --   --   HGB 6.3* 8.6* 6.3* 7.9* 7.0* 8.1*  HCT  18.1* 25.0* 18.4* 22.6* 19.6* 24.8*  MCV 91.0 88.7 90.2  --  81.3 83.8  PLT 45* 51* 32*  --  40* 46*   Cardiac Enzymes: No results for input(s): CKTOTAL, CKMB, CKMBINDEX, TROPONINI in the last 168 hours. BNP (last 3 results)  Recent Labs  07/18/2014 1340  BNP 55.8    ProBNP (last 3 results) No results for input(s): PROBNP in the last 8760 hours.  CBG:  Recent Labs Lab 08/01/14 0003  GLUCAP 159*    Recent Results (from the past 240 hour(s))  Body fluid culture     Status: None   Collection Time: 07/21/2014  6:00 PM  Result Value Ref Range Status   Specimen Description PLEURAL RIGHT  Final   Special Requests Normal  Final   Gram Stain   Final    FEW WBC PRESENT, PREDOMINANTLY PMN NO ORGANISMS SEEN Performed at [facility]    Culture   Final    NO GROWTH 3 DAYS Performed at [facility]    Report Status 08/01/2014 FINAL  Final     Studies: Ct Biopsy  07/31/2014   CLINICAL DATA:  Thrombocytopenia.  Metastatic adenocarcinoma.  EXAM: CT GUIDED DEEP ILIAC BONE ASPIRATION AND CORE BIOPSY  TECHNIQUE: The procedure, risks (including but not limited to bleeding, infection, organ damage ), benefits, and alternatives were explained to the patient. Questions regarding the procedure were encouraged and answered. The patient understands and consents to the procedure. Patient was placed supine on the CT gantry and limited axial scans through the pelvis were obtained. Appropriate skin entry site was identified. Skin site was marked, prepped with Betadine, draped in usual sterile fashion, and infiltrated locally with 1% lidocaine.  Intravenous Fentanyl and Versed were administered as conscious sedation during continuous cardiorespiratory monitoring by the radiology RN, with a total moderate sedation time of 6 minutes.  Under CT fluoroscopic guidance an 11-gauge Cook trocar bone needle was advanced into the right iliac bone just lateral to the sacroiliac joint. Once needle tip  position was confirmed, core and aspiration samples were obtained. The final sample was obtained using the guiding needle itself, which was then removed. Post procedure scans show no hematoma or fracture. Patient tolerated procedure well.  COMPLICATIONS: COMPLICATIONS none  IMPRESSION: 1. Technically successful CT guided right iliac bone core and aspiration biopsy.   Electronically Signed   By: Lucrezia Europe M.D.   On: 07/31/2014 11:46   Portable Chest Xray  08/01/2014   CLINICAL DATA:  Hypoxia  EXAM: PORTABLE CHEST - 1 VIEW  COMPARISON:  Study obtained earlier in the day.  FINDINGS: Endotracheal tube tip is 3.3 cm above the carina. Central catheter tip is at the cavoatrial junction. No pneumothorax. Moderate effusion remains on the right with extensive airspace disease throughout the right lung. There is patchy consolidation in the left base with small left effusion. Heart size and pulmonary vascularity are within normal limits. There is atherosclerotic change in the aorta. No adenopathy.  IMPRESSION: Tube and catheter positions as described without pneumothorax. Persistent airspace disease in effusion on the right with a lesser degree of consolidation in the left base. Small left effusion. No change in cardiac silhouette.   Electronically Signed   By: Lowella Grip III M.D.   On: 08/01/2014 07:02   Portable Chest Xray  08/01/2014   CLINICAL DATA:  Endotracheal tube placement.  Initial encounter.  EXAM: PORTABLE CHEST - 1 VIEW  COMPARISON:  Chest radiograph performed 07/31/2014  FINDINGS: The patient's endotracheal tube is seen ending 5 cm above the carina. A right PICC is noted ending about the mid SVC.  There is a small to moderate right-sided pleural effusion, with diffuse right-sided airspace opacification. This may reflect asymmetric pulmonary edema or pneumonia. As previously noted, interstitial spread of tumor could have a similar appearance. The left lung appears relatively clear. The left costophrenic  angle is incompletely imaged on this study.  The cardiomediastinal silhouette  is borderline normal in size, though not well assessed due to right-sided airspace opacification. No acute osseous abnormalities are seen.  IMPRESSION: 1. Endotracheal tube seen ending 5 cm above the carina. 2. Small to moderate right-sided pleural effusion, somewhat increased from the recent prior study, with diffuse right-sided airspace opacification. This may reflect asymmetric pulmonary edema or pneumonia. As previously noted, interstitial spread of tumor could have a similar appearance.   Electronically Signed   By: Garald Balding M.D.   On: 08/01/2014 00:28   Dg Chest Port 1 View  07/31/2014   CLINICAL DATA:  Shortness of Breath  EXAM: PORTABLE CHEST - 1 VIEW  COMPARISON:  07/30/2014  FINDINGS: Cardiomediastinal silhouette is stable. There is elevation of the right hemidiaphragm. Persistent masslike consolidation in right upper lobe. Streaky interstitial prominence and airspace opacification right lung is unchanged from prior exam. Interstitial tumor spread or pneumonia cannot be excluded. Small residual right pleural effusion. Left lung is clear. There is no pneumothorax.  IMPRESSION: There is elevation of the right hemidiaphragm. Persistent masslike consolidation in right upper lobe. Streaky interstitial prominence and airspace opacification right lung is unchanged from prior exam. Interstitial tumor spread or pneumonia cannot be excluded. Small residual right pleural effusion. Left lung is clear.   Electronically Signed   By: Lahoma Crocker M.D.   On: 07/31/2014 08:02   Dg Chest Port 1 View  07/30/2014   CLINICAL DATA:  Status post right thoracentesis. History of metastatic adenocarcinoma of the lung.  EXAM: PORTABLE CHEST - 1 VIEW  COMPARISON:  Chest x-ray earlier today at 1356 hours  FINDINGS: Diminished right pleural fluid volume noted since the prior chest x-ray. Following thoracentesis, no pneumothorax is evident and there is  some improved aeration at the right lung base. Mass-like opacity in the right upper lung and significant interstitial opacity throughout the remaining right lung appears stable and are suspicious for tumor with potential lymphangitic spread. Some of these changes may also relate to radiation therapy. Component of pneumonia may be present. No edema identified. The left lung shows stable basilar opacity likely representing atelectasis. PICC line extends into the lower SVC.  IMPRESSION: No pneumothorax following right-sided thoracentesis. There is improved aeration at the right lung base. Extensive pulmonary abnormalities on the right again identified suspicious for tumor with lymphangitic spread and/or radiation therapy changes. Component of pneumonia may be present.   Electronically Signed   By: Aletta Edouard M.D.   On: 07/30/2014 18:05   Dg Abd Portable 1v  08/01/2014   CLINICAL DATA:  Orogastric tube placement  EXAM: PORTABLE ABDOMEN - 1 VIEW  COMPARISON:  Portable exam at 1041 hr compared to CT abdomen/pelvis of 07/05/2014  FINDINGS: Tip of gastric tube projects over mid stomach.  Normal bowel gas pattern.  Aortoiliac stent post abdominal aortic aneurysm repair.  Osseous demineralization with degenerative disc disease changes of the lumbar spine.  IMPRESSION: Tip of nasogastric tube projects over mid stomach pre   Electronically Signed   By: Lavonia Dana M.D.   On: 08/01/2014 11:18   US Thoracentesis Asp Pleural Space W/img Guide  07/30/2014   CLINICAL DATA:  Right Pleural Effusion  EXAM: ULTRASOUND GUIDED Right THORACENTESIS  COMPARISON:  None.  PROCEDURE: An ultrasound guided thoracentesis was thoroughly discussed with the patient and questions answered. The benefits, risks, alternatives and complications were also discussed. The patient understands and wishes to proceed with the procedure. Written consent was obtained.  Ultrasound was performed to localize and mark an adequate pocket  of fluid in the Right  chest. The area was then prepped and draped in the normal sterile fashion. 1% Lidocaine was used for local anesthesia. Under ultrasound guidance a 19 gauge Yueh catheter was introduced. Thoracentesis was performed. The catheter was removed and a dressing applied.  COMPLICATIONS: None.  FINDINGS: A total of approximately 600 mls of Dark Bloody fluid was removed. A fluid sample was saved and will besent for laboratory analysis per ordering physician.  IMPRESSION: Successful ultrasound guided Right thoracentesis yielding 600 mls of pleural fluid.  Read by:  Gareth Eagle, PA-C   Electronically Signed   By: Jerilynn Mages.  Shick M.D.   On: 07/30/2014 17:13    Scheduled Meds: . sodium chloride   Intravenous Once  . antiseptic oral rinse  7 mL Mouth Rinse QID  . cefTAZidime (FORTAZ)  IV  1 g Intravenous Q8H  . chlorhexidine  15 mL Mouth Rinse BID  . ipratropium-albuterol  3 mL Nebulization Q6H  . methimazole  60 mg Oral Daily  . methylPREDNISolone (SOLU-MEDROL) injection  60 mg Intravenous Q6H  . metronidazole  500 mg Intravenous Q8H  . pantoprazole (PROTONIX) IV  40 mg Intravenous Daily  . sodium chloride  10-40 mL Intracatheter Q12H  . vancomycin  1,000 mg Intravenous Q12H   Continuous Infusions: . sodium chloride 1,000 mL (08/01/14 0013)  . feeding supplement (VITAL AF 1.2 CAL) 1,000 mL (08/01/14 1130)  . fentaNYL infusion INTRAVENOUS 200 mcg/hr (08/01/14 1300)  . norepinephrine (LEVOPHED) Adult infusion 10 mcg/min (08/01/14 1200)    Active Problems:   Pneumonia, organism unspecified   Rheumatoid arthritis   Hypertension   Thyroid enlargement   Anemia   Pleural effusion   HCAP (healthcare-associated pneumonia)   Acute respiratory failure   Hemoptysis   Malnutrition of moderate degree   Thrombocytopenia   Malignant pleural effusion   Palliative care encounter   DNR (do not resuscitate) discussion   Septic shock    Time spent: 30 min    Mont Jagoda, [facility] Pager  [phone]. If 7PM-7AM, please contact night-coverage at [email or web address], password [email or web address] 08/01/2014, 3:05 PM  LOS: 4 days

## 2014-08-01 NOTE — Progress Notes (Addendum)
Unfortunately, the results of his bone marrow biopsy came back. It shows that he has in essence complete replacement of his bone marrow by malignancy.  I had another long talk with his son. His son's wife was with him.  I told him that whenever we see cancer invading the bone marrow, that there is absolute no way that we can use chemotherapy. If we tried any chemotherapy, then the chemotherapy itself would kill him. He would absolutely have no reserve to be able to fight any side effects. I told him that it would be unethical for me to use chemotherapy on his father. I would not want to see his father pass because of chemotherapy side effects and absolutely have no benefit to chemotherapy.  As devastating as this is for his son to hear this, his son understands this completely and knows that there will be no chemotherapy that we can offer his father.  I talked at length about making sure that his father has respect, comfort and dignity. His son also like to have this.  The real problem that we have is that the patient's wife is still over in [place] and it sounds like she might be able to make it back by June 15. I told him that I really do not think that he would survive more than 1 week. He is already on a high amount of pressor support. I told his son that his kidneys will start to shut down. Once this happens, the rest of the body goes quickly.  I spoke to his son about transfusion support. I really do not believe that transfusing him will add to his lifespan. 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 He just does not respond to platelets.  Again, since the bone marrow is replaced by malignancy, [name] will continue to have bleeding issues. I told his son there really is very little that we can do to try to stop this.  The son talked to me about some financial issues that he would like to try to get resolved if possible. Apparently, his father has accounts in his name. The son  somehow needs to get these accounts signed over to his mother. He wants to try to "unsedate" his father and try to get his father to be alert enough to make the changes with the accounts. I just don't know if this ever is going to happen but at least if it was tried, his son would be most appreciative. I told his son that I just did not think his father would be able to be alert enough to sign any kind of document or bank check.  I do believe that his son now understands that everything has been done to date to try to help his father. I don't think that his father will last much longer given that he still needs a high amount of pressor support and that this will begin to damage his organs.  His son wants to call his mother in [place]. He wants to let her know what is going on. He wants to try to get her to make a decision regarding further sustaining life support.  I realize that this is a incredibly difficult situation for his son to be in. He just cannot discontinue life-support measures right now. Again, I told him that I just do not think that his father would make it more than a week. He will either bleed or have an infection or have further lung  decompensation that cannot be overcome by a ventilator.  At least, the son understands that chemotherapy is not an option at this point.  We will still do our best to try to help out in this incredibly difficult and delicate situation.   [place] 1:5-8

## 2014-08-01 NOTE — Progress Notes (Signed)
Daily Progress Note   Patient Name: [name] [name]       Date: 08/01/2014 DOB: 14-May-1940  Age: 74 y.o. MRN#: [id number] Attending Physician: Rigoberto Noel, MD Primary Care Physician: [name], MD Admit Date: 07/20/2014  Reason for Consultation/Follow-up: Establishing goals of care and Psychosocial/spiritual support  Subjective:  Continued conversation with son today regarding his father's current medical situation/currently intubated.    He continues to voice dissatisfaction and mistrust to the concept  that the medical team is actually doing everything possible to "keep his father alive"   Son again reiterates the importance of not "taking away hope", he is very clear that he and his father are open to all offered and available medical interventions to prolong life, "you must keep him alive until June 20th" (this is when his mother will return from [place])  Many attempts/emotional support to help son understand the medical situation and the fact that medical interventions are being exhausted in attempts to prolong life, but medical team is trying to communicate that regardless of medical interventions prognosis is limited.  He has unrealistic beliefs regarding medical interventions and its limitations.     Long conversations and attempts at emotional support don't seem to alleviate the patient's son mistrust and criticism of current healthcare delivery.  At times he verbalizes anger and frustration "I know how to appreciate and I know how to revenge"  Risk management has been made aware of the situation.  This NP spoke to Risk management yesterday  PMT will continue to support holistically  Length of Stay: 4 days  Current Medications: Scheduled Meds:  . sodium chloride   Intravenous Once  . sodium chloride   Intravenous Once  . sodium chloride   Intravenous Once  . antiseptic oral rinse  7 mL Mouth Rinse QID  . cefTAZidime (FORTAZ)  IV  1 g Intravenous Q8H  . chlorhexidine  15 mL  Mouth Rinse BID  . ipratropium-albuterol  3 mL Nebulization Q6H  . methimazole  60 mg Oral Daily  . methylPREDNISolone (SOLU-MEDROL) injection  60 mg Intravenous Q6H  . metronidazole  500 mg Intravenous Q8H  . pantoprazole (PROTONIX) IV  40 mg Intravenous Daily  . sodium chloride  10-40 mL Intracatheter Q12H  . vancomycin  1,000 mg Intravenous Q12H    Continuous Infusions: . sodium chloride 1,000 mL (08/01/14 0013)  . feeding supplement (VITAL AF 1.2 CAL)    . fentaNYL infusion INTRAVENOUS 150 mcg/hr (08/01/14 1100)  . norepinephrine (LEVOPHED) Adult infusion 10 mcg/min (08/01/14 1100)    PRN Meds: acetaminophen, fentaNYL, midazolam, midazolam, morphine, ondansetron (ZOFRAN) IV, sodium chloride  Palliative Performance Scale: 30% at best     Vital Signs: BP 96/55 mmHg  Pulse 89  Temp(Src) 98.8 F (37.1 C) (Core (Comment))  Resp 19  Ht '5\' 7"'$  (1.702 m)  Wt 69.3 kg (152 lb 12.5 oz)  BMI 23.92 kg/m2  SpO2 100% SpO2: SpO2: 100 % O2 Device: O2 Device: Ventilator O2 Flow Rate: O2 Flow Rate (L/min): 15 L/min  Intake/output summary:   Intake/Output Summary (Last 24 hours) at 08/01/14 1207 Last data filed at 08/01/14 1100  Gross per 24 hour  Intake 3547.37 ml  Output    500 ml  Net 3047.37 ml   Baseline Weight: Weight: 60.5 kg (133 lb 6.1 oz) Most recent weight: Weight: 69.3 kg (152 lb 12.5 oz)  Physical Exam:   General: chronically ill appearing, intubated CVS: tachycardic Skin: warm and dry  Additional Data Reviewed: Recent Labs     07/31/14  0430  08/01/14  0345  WBC  7.5  10.7*  HGB  7.0*  8.1*  PLT  40*  46*  NA  135  135  BUN  52*  60*  CREATININE  0.63  0.76     Problem List:  Patient Active Problem List   Diagnosis Date Noted  . Septic shock   . Palliative care encounter 07/31/2014  . DNR (do not resuscitate) discussion 07/31/2014  . Malignant pleural effusion   . Malnutrition of moderate degree 07/30/2014  . Thrombocytopenia   .  Hemoptysis 07/03/2014  . Acute respiratory failure 07/10/2014  . Bone metastases 07/09/2014  . HCAP (healthcare-associated pneumonia) 07/08/2014  . Hyperthyroidism 07/06/2014  . SDH (subdural hematoma) 07/06/2014  . Neck mass   . Pleural effusion   . 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 Metastatic cancer 07/04/2014  . Goiter 07/04/2014  . Leukocytosis 07/04/2014  . Dysphagia 07/04/2014  . SOB (shortness of breath) 07/04/2014  . Thyroid enlargement 07/04/2014  . Poor dentition 07/04/2014  . Epidural mass 07/04/2014  . Right paraspinous mass 07/04/2014  . Rib lesion 07/04/2014  . Dehydration 07/04/2014  . Anemia 07/04/2014  . Metastasis 07/04/2014  . AAA (abdominal aortic aneurysm) without rupture 11/12/2013  . Pain of right lower extremity-Lower Back and  Legs 11/12/2013  . Weakness-Bilateral Leg and Right arm > Left arm 11/12/2013  . Hypertension   . Osteoporosis   . Lung nodule   . 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 OTHER ABNORMAL BLOOD CHEMISTRY 02/11/2009  . SCIATICA, RIGHT 07/31/2008  . TOBACCO ABUSE 05/03/2007  . Pneumonia, organism unspecified 05/03/2007  . Rheumatoid arthritis 12/30/2006  . OSTEOPENIA 12/30/2006  . APPENDECTOMY, HX OF 12/30/2006  . Other postprocedural status(V45.89) 12/30/2006     Palliative Care Assessment & Plan    Code Status:  Full code-- now intubated, son is now hyper-focused on conversation concerning need for trach " in seven days", tried to alleviate need for concern for trach at this time   Goals of Care:  Open to all offered and available medical interventions to prolong life,  Open to oncology treatments for cancer diagnosis.    Thank you for allowing the Palliative Medicine Team to assist in the care of this patient.   Time In: 0930 Time Out: 1015 Total Time 45 min Prolonged Time Billed  no     Greater than 50%  of this time was spent  counseling and coordinating care related to the above assessment and plan.   Knox Royalty, NP  08/01/2014, 12:07 PM  Please contact Palliative Medicine Team phone at [phone] for questions and concerns.    Discussed with Dr Sheran Fava and Sallye Ober NP

## 2014-08-01 NOTE — Progress Notes (Signed)
Shift event note:  Notified by RN regarding pt's decreased LOC and copious gurgling type respirations. Pt currently on NRB at 100% and sats ranging from 88-92. ABG requested. At bedside pt noted very lethargic and ill appearing. Gross gurgling type respirations d/t copious bloody secretions. Minimal improvement w/ attempts at NTS. Dianna Rossetti, PA w/ CCM also at bedside at request of [facility] MD as CCM was consulted today by primary MD. ABG reveals respiratory acidosis (pH-7.16, pC02-57.3, p02-64.2 and bicarb of 19.7). After much discussion w/ son decision was made to intubate pt. Please see CCM note.  Assessment/Plan: 1. Acute hypoxic/hypercarbic respiratory failure: In setting of possible healthcare associated pneumonia, associated with hemoptysis and a large malignant bloody right-sided pleural effusion s/p thoracentesis. Son at bedside wants aggressive treatment despite much discussion w/ CCM provider regarding likely futility of aggressive measures and diminished quality of life. Son has unrealistic goals of care. Wants father to stay alive until mother arrives from [place] on 08/18/14. Unsure exactly how much pt's son understands regarding poor prognosis and likelihood of pt's death regardless of measures taken. Pt intubated. CCM to manage while intubated. Appreciate CCM input and management.   Jeryl Columbia, NP-C [facility] Pager [phone]

## 2014-08-02 DIAGNOSIS — J90 Pleural effusion, not elsewhere classified: Secondary | ICD-10-CM | POA: Insufficient documentation

## 2014-08-02 DIAGNOSIS — R509 Fever, unspecified: Secondary | ICD-10-CM

## 2014-08-02 LAB — BASIC METABOLIC PANEL
ANION GAP: 7 (ref 5–15)
BUN: 67 mg/dL — ABNORMAL HIGH (ref 6–20)
CALCIUM: 6.6 mg/dL — AB (ref 8.9–10.3)
CHLORIDE: 107 mmol/L (ref 101–111)
CO2: 18 mmol/L — ABNORMAL LOW (ref 22–32)
CREATININE: 1.11 mg/dL (ref 0.61–1.24)
GFR calc non Af Amer: >60 mL/min (ref >60)
GLUCOSE: 196 mg/dL — AB (ref 65–99)
Potassium: 4.1 mmol/L (ref 3.5–5.1)
Sodium: 132 mmol/L — ABNORMAL LOW (ref 135–145)

## 2014-08-02 LAB — CBC
HCT: 31.4 % — ABNORMAL LOW (ref 39.0–52.0)
HEMOGLOBIN: 10.5 g/dL — AB (ref 13.0–17.0)
MCH: 28 pg (ref 26.0–34.0)
MCHC: 33.4 g/dL (ref 30.0–36.0)
MCV: 83.7 fL (ref 78.0–100.0)
Platelets: 29 10*3/uL — CL (ref 150–400)
RBC: 3.75 MIL/uL — ABNORMAL LOW (ref 4.22–5.81)
RDW: 17 % — ABNORMAL HIGH (ref 11.5–15.5)
WBC: 12.4 10*3/uL — ABNORMAL HIGH (ref 4.0–10.5)

## 2014-08-02 LAB — GLUCOSE, CAPILLARY
GLUCOSE-CAPILLARY: 179 mg/dL — AB (ref 65–99)
GLUCOSE-CAPILLARY: 195 mg/dL — AB (ref 65–99)
Glucose-Capillary: 144 mg/dL — ABNORMAL HIGH (ref 65–99)
Glucose-Capillary: 188 mg/dL — ABNORMAL HIGH (ref 65–99)

## 2014-08-02 LAB — VANCOMYCIN, TROUGH: VANCOMYCIN TR: 42 ug/mL — AB (ref 10.0–20.0)

## 2014-08-02 LAB — MAGNESIUM: Magnesium: 2 mg/dL (ref 1.7–2.4)

## 2014-08-02 MED ORDER — SODIUM CHLORIDE 0.9 % IV SOLN
1.0000 g/h | Freq: Four times a day (QID) | INTRAVENOUS | Status: DC
  Administered 2014-08-02: 1 g/h via INTRAVENOUS
  Filled 2014-08-02 (×4): qty 20

## 2014-08-02 MED ORDER — NOREPINEPHRINE BITARTRATE 1 MG/ML IV SOLN
2.0000 ug/min | INTRAVENOUS | Status: DC
  Administered 2014-08-02: 50 ug/min via INTRAVENOUS
  Filled 2014-08-02 (×3): qty 16

## 2014-08-02 MED ORDER — DEXTROSE 5 % IV SOLN
0.0000 ug/min | INTRAVENOUS | Status: DC
  Filled 2014-08-02: qty 16

## 2014-08-02 MED ORDER — DEXTROSE 5 % IV SOLN
1.0000 g | Freq: Two times a day (BID) | INTRAVENOUS | Status: DC
  Filled 2014-08-02: qty 1

## 2014-08-03 LAB — TYPE AND SCREEN
ABO/RH(D): B POS
Antibody Screen: NEGATIVE
Unit division: 0
Unit division: 0

## 2014-08-04 ENCOUNTER — Ambulatory Visit: Payer: BC Managed Care – PPO | Admitting: Radiation Oncology

## 2014-08-04 LAB — CULTURE, RESPIRATORY: GRAM STAIN: NONE SEEN

## 2014-08-04 LAB — CULTURE, RESPIRATORY W GRAM STAIN

## 2014-08-06 ENCOUNTER — Telehealth: Payer: Self-pay

## 2014-08-06 NOTE — Telephone Encounter (Signed)
On 08/06/2014 I received a death certificate from [employer] and [place]. This patient is a patient of Doctor McQuaid. The death certificate is for cremation. The death certificate will be taken to the 2nd floor of [place] at [facility] for Doctor McQuaid to sign the death certificate. On Sep 03, 2014 I received the death certificate from [employer] and [place]. I got the death certificate ready for pickup. I called the funeral home to let them know the death certificate was ready and going to fax them a copy per their request.

## 2014-08-08 LAB — CHROMOSOME ANALYSIS, BONE MARROW

## 2014-08-13 ENCOUNTER — Ambulatory Visit: Payer: BC Managed Care – PPO | Admitting: Hematology & Oncology

## 2014-08-13 ENCOUNTER — Other Ambulatory Visit: Payer: BC Managed Care – PPO

## 2014-08-13 ENCOUNTER — Ambulatory Visit: Payer: BC Managed Care – PPO

## 2014-08-15 NOTE — Discharge Summary (Signed)
NAME:  [name] [name], BUCKLES NO.:  [id number]  MEDICAL RECORD NO.:  [id number]  LOCATION:  8182                         FACILITY:  [facility]  PHYSICIAN:  Norlene Campbell, MDDATE OF BIRTH:  [birth year]  DATE OF ADMISSION:  07/04/2014 DATE OF DISCHARGE:  10-Aug-2014                              DISCHARGE SUMMARY   DEATH SUMMARY  DATE OF DEATH:  2014/08/10.  CAUSES OF DEATH: 1. Healthcare-associated pneumonia. 2. 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 3. Severe hemorrhagic anemia and thrombocytopenia.  HISTORY OF PRESENT ILLNESS:  This is a 74 year old male with a new diagnosis of metastatic adenocarcinoma, who was admitted to the [facility] Emergency Department on Jul 28, 2014, for acute hypoxemic respiratory failure in the setting of a large right-sided pleural effusion.  He had recently been hospitalized for failure to thrive and discovery of diffuse metastatic lesions.  He had a lymph node biopsy on 2 separate occasions, which was consistent with adenocarcinoma of uncertain primary.  He had been discharged to a skilled nursing facility and was still coming back to the hospital for radiation therapy treatments.  However, he developed worsening shortness of breath and came to the emergency department on Jul 28, 2014.  He was found to have a large right-sided pleural effusion.  Past medical history, family history, social history, see the admission H and P.  PHYSICAL EXAMINATION:  Physical exam on the day of death.  VITAL SIGNS:  Temperature 100.6, heart rate 115, respirations 11, blood pressure 95/68, SpO2 83% on the ventilator. GENERAL:  Weak, on vent. HEENT:  [place]/AT, ET tube is in place. PULMONARY:  Diminished on the right.  Crackles noted bilaterally.  Vent supported breaths. CARDIOVASCULAR:  Tachy, regular. GI:  Bowel sounds positive.  Soft and nontender. MUSCULOSKELETAL:  Diminished bulk and tone. NEUROLOGIC:  Wakes to voice.  Follows  commands.  HOSPITAL COURSE:  The patient was admitted to the intensive care unit for further management of shortness of breath in the setting of a large right-sided pleural effusion.  In the emergency department, on admission, he had a thoracentesis performed, during which time 1500 mL of very bloody fluid was removed.  The patient was admitted for further management.  He was found to have a healthcare-associated pneumonia and was treated with antibiotics.  His respiratory status and overall ability to fight the infection declined over the course of the week and he eventually was intubated.  There were multiple conversations with the patient's family regarding his goals of care.  Because of the patient's frail state, his metastatic malignancy which was clearly progressing based on imaging findings and his pancytopenia, it was felt that his overall prognosis was very poor.  Oncology as well as Palliative Medicine were consulted and the [facility] team was extremely helpful in facilitating conversations with the family.  Eventually, the patient's family felt that their father had been put through enough and they felt that he would not recover from the illness and so they requested that we not escalate his care.  At this time, on Aug 10, 2014, his septic shock was worsening and his respiratory failure was worsening as  was his chest x-ray findings.  We did not escalate care and we focused on his comfort at that point and he died peacefully with his family at the bedside.          ______________________________ Norlene Campbell, MD     DBM/MEDQ  D:  08/14/2014  T:  08/15/2014  Job:  [id number]

## 2014-08-20 ENCOUNTER — Ambulatory Visit: Payer: BC Managed Care – PPO

## 2014-08-20 ENCOUNTER — Other Ambulatory Visit: Payer: BC Managed Care – PPO

## 2014-08-20 ENCOUNTER — Telehealth: Payer: Self-pay | Admitting: Pulmonary Disease

## 2014-08-20 ENCOUNTER — Ambulatory Visit: Payer: BC Managed Care – PPO | Admitting: Hematology & Oncology

## 2014-08-20 NOTE — Telephone Encounter (Signed)
I spoke to the patient's wife and daughter in law. They will meet me for a social visit at our office on Monday morning at 8:30 (jun 27)

## 2014-08-20 NOTE — Telephone Encounter (Signed)
Called and Nancy Fetter refuses to talk to nurse, only wants to speak to BQ  To BQ

## 2014-08-22 ENCOUNTER — Encounter (HOSPITAL_COMMUNITY): Payer: Self-pay

## 2014-08-29 NOTE — Plan of Care (Signed)
Problem: Phase I Progression Outcomes Goal: Dyspnea controlled at rest Outcome: Progressing Pt is resting on ventilator, no spontaneous breathing noted at this time. Goal: Pain controlled with appropriate interventions Outcome: Completed/Met Date Met:  08/29/14 Fentanyl gtt. for comfort, titrate as tolerated. Goal: Code status addressed with pt/family Outcome: Completed/Met Date Met:  29-Aug-2014 Son has accepted prognosis, pt is actively dying and imminent at this time.  Levophed remains at 32mg/min.  No further escalation of care including vasopressors.  Addendum:  Pt passed at 1926.

## 2014-08-29 NOTE — Progress Notes (Signed)
ANTIBIOTIC CONSULT NOTE - FOLLOW UP  Pharmacy Consult for Vancomycin, Ceftazidime Indication: pneumonia  Allergies  Allergen Reactions  . Other     Catfish + IV pain medication (unknown name)-vomits    Patient Measurements: Height: '5\' 7"'$  (170.2 cm) Weight: 162 lb 0.6 oz (73.5 kg) IBW/kg (Calculated) : 66.1  Vital Signs: Temp: 100.6 F (38.1 C) (06/04 0800) BP: 71/53 mmHg (06/04 0800) Pulse Rate: 112 (06/04 0800) Intake/Output from previous day: 06/03 0701 - 06/04 0700 In: 5415.8 [I.V.:3110.8; Blood:650; NG/GT:805; IV Piggyback:850] Out: 330 [Urine:330]  Labs:  Recent Labs  07/31/14 0430 08/01/14 0345 August 17, 2014 0510  WBC 7.5 10.7* 12.4*  HGB 7.0* 8.1* 10.5*  PLT 40* 46* 29*  CREATININE 0.63 0.76 1.11   Estimated Creatinine Clearance: 57.1 mL/min (by C-G formula based on Cr of 1.11).  Recent Labs  08-17-14 1030  VANCOTROUGH 42*    Assessment: 89 yoM with newly diagnosed metastatic adenocarcinoma of uncertain origin admitted for acute hypoxemic respiratory failure secondary to possible HCAP, hemoptysis, and a large right pleural effusion.  Patient was recently discharged to SNF on 07/18/14.  CT chest shows right upper lobe consolidation with local interstitial infiltration and right/left pleural effusions.  Emergent thoracentesis performed in ED and 1500 mL of very bloody fluid removed, sent for culture.  Pharmacy consulted to dose Vancomycin and Zosyn for HCAP.  D/t concern for thrombocytopenia, Zosyn was changed to ceftazidime and metronidazole to cover for aspiration pneumonia.  5/30 >> Vanc >> 5/30 >> Zosyn >> 6/1 6/1 >> Ceftaz >> 6/1 >> Flagyl >>  Micro: 5/30 pleural fluid: NGF 6/3 Trach Asp: sent  Today:  Tm 100.6  WBC 12.4 (expected elevation on IV solumedrol)  Renal: SCr trending up to 1.11 with CrCl ~ 57 ml/min  Vancomycin trough level: 42, supratherapeutic.  Suspect decreasing renal perfusion with worsening shock, oliguric, titration of Levophed  infusion.  Will also empirically decrease ceftazidime dose with poor renal function.  Goal of Therapy:  Vancomycin trough level 15-20 mcg/ml Appropriate abx dosing, eradication of infection.   Plan:   Decrease to ceftazidime 1g IV q12h  D/C Vancomycin doses  Recheck Vanc random level with AM labs.    Follow up renal fxn, culture results, and clinical course.  MD, If low suspicion for HCAP, can antibiotics be narrowed?   Gretta Arab PharmD, BCPS Pager [phone] Aug 17, 2014 11:45 AM

## 2014-08-29 NOTE — Progress Notes (Signed)
PULMONARY / CRITICAL CARE MEDICINE   Name: [name] [name] MRN: [id number] DOB: 06-22-1940    ADMISSION DATE:  07/17/2014 CONSULTATION DATE:  07/16/2014  REFERRING MD :  EDP  CHIEF COMPLAINT:  Shortness of breath, hemoptysis  INITIAL PRESENTATION: 74 year old male with newly diagnosed metastatic adenocarcinoma of uncertain origin was admitted through the [facility] emergency department on 07/23/2014 for acute hypoxemic respiratory failure, hemoptysis, and a large right pleural effusion.  STUDIES:  5/30  CT Chest >> Right upper lobe consolidation with local interstitial infiltration significantly increased from previous, large right-sided pleural effusion, moderate-sized left-sided pleural effusion, improved neck lymphadenopathy compared to May 5 study  SIGNIFICANT EVENTS: 5/30  Thoracentesis >> 1500 mL of very bloody fluid removed- cytology POS 6/1 Thoracentesis per IR, 677m removed 6/02  Afebrile, decreased platelets, 100% NRB, hemoptysis  6/03  Early am intubation by anesthesia.  Levophed.  Extensive discussion with family regarding patients status (see below)  SUBJECTIVE:  Worsening shock, oliguria, failed SBT   VITAL SIGNS: Temp:  [98.4 F (36.9 C)-100.6 F (38.1 C)] 100.6 F (38.1 C) (06/04 0830) Pulse Rate:  [83-129] 115 (06/04 0830) Resp:  [10-121] 11 (06/04 0830) BP: (71-129)/(52-89) 95/68 mmHg (06/04 0830) SpO2:  [83 %-100 %] 83 % (06/04 0830) FiO2 (%):  [40 %-60 %] 40 % (06/04 0830) Weight:  [73.5 kg (162 lb 0.6 oz)] 73.5 kg (162 lb 0.6 oz) (06/04 0459)   HEMODYNAMICS:     VENTILATOR SETTINGS: Vent Mode:  [-] PRVC FiO2 (%):  [40 %-60 %] 40 % Set Rate:  [16 bmp] 16 bmp Vt Set:  [530 mL] 530 mL PEEP:  [5 cmH20] 5 cmH20 Plateau Pressure:  [16 cmH20-35 cmH20] 28 cmH20   INTAKE / OUTPUT:  Intake/Output Summary (Last 24 hours) at 0Jun 14, [id number] Last data filed at 0June 14, [id number]  Gross per 24 hour  Intake 5129.58 ml  Output    205 ml  Net 4924.58 ml    PHYSICAL  EXAMINATION: General:  Weak, on vent HENT: NCAT ETT  PULM: diminished on R, crackles noted bilaterally, vent supported breaths CV: Tachy, regular GI: BS+, soft, nontender MSK: diminshed bulk/tone Neuro: wakes to voice, follows commands  LABS:  CBC  Recent Labs Lab 07/31/14 0430 08/01/14 0345 [id number]  WBC 7.5 10.7* 12.4*  HGB 7.0* 8.1* 10.5*  HCT 19.6* 24.8* 31.4*  PLT 40* 46* 29*   Coag's  Recent Labs Lab 07/30/14 1300  APTT 31  INR 1.26    BMET  Recent Labs Lab 07/31/14 0430 08/01/14 0345 [id number]  NA 135 135 132*  K 3.6 3.8 4.1  CL 106 107 107  CO2 21* 20* 18*  BUN 52* 60* 67*  CREATININE 0.63 0.76 1.11  GLUCOSE 129* 158* 196*   Electrolytes  Recent Labs Lab 07/31/14 0430 08/01/14 0345 [id number]  CALCIUM 7.1* 6.7* 6.6*  MG 2.0 2.2 2.0  PHOS  --  4.1  --    Sepsis Markers No results for input(s): LATICACIDVEN, PROCALCITON, O2SATVEN in the last 168 hours.   ABG  Recent Labs Lab 07/30/14 1546 07/31/14 2136 08/01/14 0034  PHART 7.301* 7.160* 7.234*  PCO2ART 38.3 57.3* 41.3  PO2ART 44.1* 64.2* 114*    Liver Enzymes  Recent Labs Lab 07/22/2014 1340  AST 17  ALT 24  ALKPHOS 51  BILITOT 0.7  ALBUMIN 1.9*   Cardiac Enzymes No results for input(s): TROPONINI, PROBNP in the last 168 hours.   Glucose  Recent Labs Lab 08/01/14 0003 08/01/14 2035 08/01/14  2318 2014/08/19 0356 08/19/14 0802  GLUCAP 159* 143* 144* 179* 188*    Imaging CXR 6/3 > ETT in place, large R sided effusion recurred with airspace disease on R   ASSESSMENT / PLAN:  PULMONARY  A:  1.  Acute Hypoxemic Respiratory Failure - right upper lobe consolidation seen on the CT chest is consistent with malignancy / lymphangitic spread  2. Hemoptysis - secondary to thrombocytopenia / cancer  3. Malignant R Pleural Effusion - Very bloody, 1500 ml pleural fluid removed 5/30, this is most likely due to pleural metastasis bleeding in the setting of  thrombocytopenia  P:   Continue MV support, 8 cc/kg SBT as able daily Wean PEEP/FiO2 for saturations > 90% Repeat CXR in AM No more pleural drainage given dismal prognosis  CARDIOVASCULAR A:  Sinus tachycardia - secondary to bleeding Shock, septic : worse 6/4 AM P:  Telemetry monitoring Continue to hold BP meds Levophed to maintain MAP > 65, do not add more blood pressure support Continue solu-medrol 44m Q6  RENAL A:   Hyponatremia ? Of malignancy Oliguric acute renal failure due to septic shock P:   Monitor BMET and UOP Replace electrolytes as needed Continue NS @ 100 ml/hr  GASTROINTESTINAL A:   Heme positive stool but clinically not bleeding from GI tract P:   Monitor for bleeding Place OGT Continue TF  HEMATOLOGIC A:   Hemorrhagic anemia > improved post blood transfusions Thrombocytopenia - likely secondary to bleeding and/or radiation treatment, persistent DVT left subclavian vein Adenocarcinoma of uncertain etiology, EGFR neg P:  Transfuse PRBC for Hb < 7 Transfuse platelets for 50k or active bleeding  No anticoagulation considering bleeding and thrombocytopenia Dr. EMarin Olpfollowing - Dr. SCrisoforo Oxfordis not a candidate for chemotherapy given severity of illness  INFECTIOUS A:  Healthcare associated pneumonia P:   Vancomycin 5/30 >> Zosyn 5/30 > 6/1 Flagyl 6/1 >> Ceftaz 6/1 >>   ENDOCRINE A:   Hyperthyroidism in setting of thyroid mass   P:   Hold methimazole with thrombocytopenia   NEUROLOGIC A:   Pain in setting of thoracic metastasis Right leg weakness secondary to spinal cord compression P:   RASS Goal:  0 to -1 Fentanyl gtt for pain PRN versed for sedation     FAMILY  - Updates:  Mr. SSrokason was updated at bedside by me this morning after he had spoken to Dr. EMarin Olpand Dr. SSheran Fava  He is now coming to grips with his father's death and has been slowly talking to his father about funeral arrangements.  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  Any attempts to do so in his current condition would unsuccessful and inappropriate.  Code status changed to partial code, I completely agree.  I do not think we should escalate care further and asked that his son stay with him today as he may not survive the night.  CC Time: 45 minutes    BRoselie Awkward MD [place] [facility] Pager: [phone]: [phone] 3pm or if no response, call [phone]  62016/06/21 9:46 AM

## 2014-08-29 NOTE — Procedures (Signed)
Extubation Procedure Note  Patient Details:   Name: [name] [name] DOB: 1940-05-28 MRN: [id number]   Airway Documentation:  Airway 7.5 mm (Active)  Secured at (cm) 23 cm 18-Aug-2014  3:57 PM  Measured From Lips Aug 18, 2014  3:57 PM  [place] 08-18-2014  3:57 PM  Secured By [employer] August 18, 2014  3:57 PM  Tube Holder Repositioned Yes 08/18/14  3:57 PM  Cuff Pressure (cm H2O) 22 cm H2O 08-18-14  3:57 PM  Site Condition Dry 08/18/14  3:01 AM    Evaluation  O2 sats: Pt expired Complications: No apparent complications Patient Expired  tolerate procedure well. Bilateral Breath Sounds: Rhonchi Suctioning: Oral No  Rosann Auerbach August 18, 2014, 7:43 PM

## 2014-08-29 NOTE — Clinical Social Work Note (Signed)
CSW was contacted by RN to assist pt's son with end of life financial help.  CSW met with pt's son to discuss needs.   Pt's son wanted to facilitate a financial POA per his father's wishes to appoint him or his spouse POA.  CSW helped pt's son go on line and obtain a POA.  RN stated that pt may not make the night  CSW called for mobile notary to help with pt's dying wishes.  Pt was alert most of the day but by the time notary arrived pt would not wake up  Notary stated pt's son may call him if pt gets alert.  CSW provided support for pt's son who is grieving with his father's sudden illness and eventually dying process  CSW will continue to monitor pt and his family until he is deceased  .Dede Query, LCSW [facility] Clinical Social Worker - Weekend Coverage cell #: [phone]

## 2014-08-29 NOTE — Progress Notes (Addendum)
[facility] PROGRESS NOTE  [name] [name] PIR:[id number] DOB: 11/02/1940 DOA: 07/25/2014 PCP: [name], MD  Brief Summary  74 year old male with newly diagnosed metastatic adenocarcinoma of uncertain origin early May 2016 who was readmitted through the [facility] emergency department on 07/07/2014 for acute hypoxemic respiratory failure, hemoptysis, and a large right pleural effusion.  CT chest May 30: Right upper lobe consolidation with local interstitial infiltration significantly increased from previous, large right-sided pleural effusion, moderate-sized left-sided pleural effusion, improved neck lymphadenopathy compared to May 5 study.  He underwent thoracentesis with removal of 1546m of bloody fluid and continues to have hemoptysis with anemia and thrombocytopenia.  He has had repeated thoracenteses which have improved his dyspnea, however, he developed worsening respiratory distress requiring nonrebreather and subsequent intubation on 6/2. He was started on vasopressors.  He has very aggressive and widely metastatic adenocarcinoma that his obliterated his bone marrow causing anemia and thrombocytopenia. He has been on interval for possible infection however I have low suspicion that he actually has pneumonia and I am highly suspicious that all of his symptoms are due to progressive malignancy. He is no longer a candidate for chemotherapy at this time because he is on life support and is malnourished and is too sick to undergo treatment. He is going to succumb to his cancer probably within the next few days.  The patient is more sedated today so I spoke with his son who states that his father would not want chest compressions, shocks, or further resuscitation should he die.  He wants to continue the ventilator and vasopressor support but he also wants his father to be comfortable and not suffer in any way.    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  Fevers are likely related to malignancy.   -  Code status changed from full code to partial code today:  No chest compressions or shocks at time of death, but continue current life support measures -  Too sick to start chemotherapy at this point  -  He will die an in-hospital death in the ICU  -  Appreciate Dr. EMarin Olpassistance  Acute Hypoxemic Respiratory Failure due to progressive lymphangitic spread of cancer with hemoptysis from thrombocytopenia and malignant pleural effusion.  Less likely healthcare associated pneumonia -  Continue vancomycin, fortaz + flagyl -  Pleural culture NGTD -  Mechanical ventilation and sedation management per PCCM  Hypotension and shock due to progressive malignancy and sedation, uop decreasing -  Management per PCCM  Hemoptysis and malignant pleural effusion secondary to thrombocytopenia and cancer and possible low level DIC -  Transfuse to keep platelets > 50K  -  1500 ml bloody pleural fluid removed 5/30 -  Pleural culture NGTD -  Cytology:  Positive for malignant cells -  Thoracentesis by IR on 6/1 with removal of 6027mof fluid  Sinus tachycardia secondary to progressive cancer and symptomatic anemia  -  Transfuse to keep hgb >7  Hyponatremia resolved with blood transfusions  Heme positive stool but clinically not bleeding from GI tract:  Probably has swallowed some blood from his copious hemoptysis  Anemia and thrombocytopenia likely secondary to bleeding and marrow obliteration by malignancy -  Transfuse as needed to keep hgb > 8 and plt > 50K -  Transfuse 2 units PRBC and plt pack 6/1  -  Transfuse 2 units PRBC and plt pack on 6/2 -  Platelets not responding  to transfusion and dropping, no further transfusions   DVT left subclavian vein via MRI 5/16 -  No anticoagulation considering bleeding and thrombocytopenia   Hyperthyroidism in setting of thyroid mass   -  Repeat fT4 mildly low probably due to severe illness, steroids -  Continue methimazole at current dose  Pain in setting of thoracic metastasis and right leg weakness secondary to spinal cord compression -  Continue solumedrol  Moderate malnutrition:  Nutrition is a high priority for family.  continue tube feeds  Diet:  Tube feeds Access:  PIV IVF:  off Proph:  SCDs  Code Status:  partial Family Communication: patient and son and daughter-in-law Disposition Plan:  Anticipate in-hospital death.    Consultants:  PCCM  Oncology, Dr. Marin Olp  IR  Procedures:  CT angio chest   Antibiotics:  Vancomycin 5/30 >  Zosyn 5/30 > 6/1  Ceftazidime 6/1 >  Flagyl 6/1 >   HPI/Subjective:  On ventilator.  Able to indicate he is not uncomfortable, in pain, sick to stomach, or having problems with his breathing on the ventilator  Objective: Filed Vitals:   2014-08-14 0630 August 14, 2014 0700 14-Aug-2014 0751 Aug 14, 2014 0800  BP: 92/60 83/55  71/53  Pulse: 112 112  112  Temp: 100.4 F (38 C) 100.4 F (38 C)  100.6 F (38.1 C)  TempSrc:      Resp: 17 17  16   Height:      Weight:      SpO2: 97% 99% 100% 99%    Intake/Output Summary (Last 24 hours) at August 14, 2014 0829 Last data filed at 08/14/14 0700  Gross per 24 hour  Intake 5079.58 ml  Output    205 ml  Net 4874.58 ml   Filed Weights   07/31/14 0222 08/01/14 0440 08/14/14 0459  Weight: 64.5 kg (142 lb 3.2 oz) 69.3 kg (152 lb 12.5 oz) 73.5 kg (162 lb 0.6 oz)    Exam:   General:  Adult male, intubated with bloody secretions and a suction catheter  HEENT:  NCAT, crusted blood on lips  Cardiovascular:  Tachycardic RR, nl S1, S2 no mrg, 2+ pulses, warm extremities  Respiratory:  rhonchorous bilateral breath sounds, diminished at the right base with course rales, no wheezes, breathing appears much more comfortable on the ventilator  Abdomen:   NABS, soft, moderately distended, nontender  MSK:   Normal tone and bulk, 2+  anasarca throughout  Data Reviewed: Basic Metabolic Panel:  Recent Labs Lab 07/29/14 0350 07/30/14 0343 07/31/14 0430 08/01/14 0345 2014/08/14 0510  NA 125* 132* 135 135 132*  K 4.1 3.8 3.6 3.8 4.1  CL 99* 103 106 107 107  CO2 18* 20* 21* 20* 18*  GLUCOSE 80 126* 129* 158* 196*  BUN 42* 52* 52* 60* 67*  CREATININE 0.78 0.65 0.63 0.76 1.11  CALCIUM 7.2* 7.4* 7.1* 6.7* 6.6*  MG  --   --  2.0 2.2 2.0  PHOS  --   --   --  4.1  --    Liver Function Tests:  Recent Labs Lab 07/12/2014 1340  AST 17  ALT 24  ALKPHOS 51  BILITOT 0.7  PROT 4.0*  ALBUMIN 1.9*    Recent Labs Lab 07/07/2014 1340  LIPASE 19*   No results for input(s): AMMONIA in the last 168 hours. CBC:  Recent Labs Lab 07/27/2014 1340 07/29/14 0350 07/30/14 0343 07/30/14 1636 07/31/14 0430 08/01/14 0345 Aug 14, 2014 0510  WBC 9.2 7.7 7.0  --  7.5 10.7* 12.4*  NEUTROABS 8.1*  --   --   --   --   --   --  HGB 6.3* 8.6* 6.3* 7.9* 7.0* 8.1* 10.5*  HCT 18.1* 25.0* 18.4* 22.6* 19.6* 24.8* 31.4*  MCV 91.0 88.7 90.2  --  81.3 83.8 83.7  PLT 45* 51* 32*  --  40* 46* 29*   Cardiac Enzymes: No results for input(s): CKTOTAL, CKMB, CKMBINDEX, TROPONINI in the last 168 hours. BNP (last 3 results)  Recent Labs  07/04/2014 1340  BNP 55.8    ProBNP (last 3 results) No results for input(s): PROBNP in the last 8760 hours.  CBG:  Recent Labs Lab 08/01/14 0003 08/01/14 2035 08/01/14 2318 August 25, 2014 0356  GLUCAP 159* 143* 144* 179*    Recent Results (from the past 240 hour(s))  Body fluid culture     Status: None   Collection Time: 07/21/2014  6:00 PM  Result Value Ref Range Status   Specimen Description PLEURAL RIGHT  Final   Special Requests Normal  Final   Gram Stain   Final    FEW WBC PRESENT, PREDOMINANTLY PMN NO ORGANISMS SEEN Performed at [facility]    Culture   Final    NO GROWTH 3 DAYS Performed at [facility]    Report Status 08/01/2014 FINAL  Final     Studies: Ct  Biopsy  07/31/2014   CLINICAL DATA:  Thrombocytopenia.  Metastatic adenocarcinoma.  EXAM: CT GUIDED DEEP ILIAC BONE ASPIRATION AND CORE BIOPSY  TECHNIQUE: The procedure, risks (including but not limited to bleeding, infection, organ damage ), benefits, and alternatives were explained to the patient. Questions regarding the procedure were encouraged and answered. The patient understands and consents to the procedure. Patient was placed supine on the CT gantry and limited axial scans through the pelvis were obtained. Appropriate skin entry site was identified. Skin site was marked, prepped with Betadine, draped in usual sterile fashion, and infiltrated locally with 1% lidocaine.  Intravenous Fentanyl and Versed were administered as conscious sedation during continuous cardiorespiratory monitoring by the radiology RN, with a total moderate sedation time of 6 minutes.  Under CT fluoroscopic guidance an 11-gauge Cook trocar bone needle was advanced into the right iliac bone just lateral to the sacroiliac joint. Once needle tip position was confirmed, core and aspiration samples were obtained. The final sample was obtained using the guiding needle itself, which was then removed. Post procedure scans show no hematoma or fracture. Patient tolerated procedure well.  COMPLICATIONS: COMPLICATIONS none  IMPRESSION: 1. Technically successful CT guided right iliac bone core and aspiration biopsy.   Electronically Signed   By: Lucrezia Europe M.D.   On: 07/31/2014 11:46   Portable Chest Xray  08/01/2014   CLINICAL DATA:  Hypoxia  EXAM: PORTABLE CHEST - 1 VIEW  COMPARISON:  Study obtained earlier in the day.  FINDINGS: Endotracheal tube tip is 3.3 cm above the carina. Central catheter tip is at the cavoatrial junction. No pneumothorax. Moderate effusion remains on the right with extensive airspace disease throughout the right lung. There is patchy consolidation in the left base with small left effusion. Heart size and pulmonary  vascularity are within normal limits. There is atherosclerotic change in the aorta. No adenopathy.  IMPRESSION: Tube and catheter positions as described without pneumothorax. Persistent airspace disease in effusion on the right with a lesser degree of consolidation in the left base. Small left effusion. No change in cardiac silhouette.   Electronically Signed   By: Lowella Grip III M.D.   On: 08/01/2014 07:02   Portable Chest Xray  08/01/2014   CLINICAL DATA:  Endotracheal  tube placement.  Initial encounter.  EXAM: PORTABLE CHEST - 1 VIEW  COMPARISON:  Chest radiograph performed 07/31/2014  FINDINGS: The patient's endotracheal tube is seen ending 5 cm above the carina. A right PICC is noted ending about the mid SVC.  There is a small to moderate right-sided pleural effusion, with diffuse right-sided airspace opacification. This may reflect asymmetric pulmonary edema or pneumonia. As previously noted, interstitial spread of tumor could have a similar appearance. The left lung appears relatively clear. The left costophrenic angle is incompletely imaged on this study.  The cardiomediastinal silhouette is borderline normal in size, though not well assessed due to right-sided airspace opacification. No acute osseous abnormalities are seen.  IMPRESSION: 1. Endotracheal tube seen ending 5 cm above the carina. 2. Small to moderate right-sided pleural effusion, somewhat increased from the recent prior study, with diffuse right-sided airspace opacification. This may reflect asymmetric pulmonary edema or pneumonia. As previously noted, interstitial spread of tumor could have a similar appearance.   Electronically Signed   By: Garald Balding M.D.   On: 08/01/2014 00:28   Dg Abd Portable 1v  08/01/2014   CLINICAL DATA:  Orogastric tube placement  EXAM: PORTABLE ABDOMEN - 1 VIEW  COMPARISON:  Portable exam at 1041 hr compared to CT abdomen/pelvis of 07/05/2014  FINDINGS: Tip of gastric tube projects over mid stomach.   Normal bowel gas pattern.  Aortoiliac stent post abdominal aortic aneurysm repair.  Osseous demineralization with degenerative disc disease changes of the lumbar spine.  IMPRESSION: Tip of nasogastric tube projects over mid stomach pre   Electronically Signed   By: Lavonia Dana M.D.   On: 08/01/2014 11:18    Scheduled Meds: . aminocaproic acid (AMICAR) IVPB  1 g/hr Intravenous Q6H  . antiseptic oral rinse  7 mL Mouth Rinse QID  . cefTAZidime (FORTAZ)  IV  1 g Intravenous Q8H  . chlorhexidine  15 mL Mouth Rinse BID  . ipratropium-albuterol  3 mL Nebulization Q6H  . methimazole  60 mg Oral Daily  . methylPREDNISolone (SOLU-MEDROL) injection  60 mg Intravenous Q6H  . metronidazole  500 mg Intravenous Q8H  . pantoprazole (PROTONIX) IV  40 mg Intravenous Daily  . sodium chloride  10-40 mL Intracatheter Q12H  . vancomycin  1,000 mg Intravenous Q12H   Continuous Infusions: . sodium chloride 100 mL (08/01/14 2028)  . feeding supplement (VITAL AF 1.2 CAL) 1,000 mL (08/01/14 1130)  . fentaNYL infusion INTRAVENOUS 200 mcg/hr (08/31/2014 0600)  . norepinephrine (LEVOPHED) Adult infusion 20 mcg/min (08-31-14 0630)    Active Problems:   Pneumonia, organism unspecified   Rheumatoid arthritis   Hypertension   Thyroid enlargement   Anemia   Pleural effusion   HCAP (healthcare-associated pneumonia)   Acute respiratory failure   Hemoptysis   Malnutrition of moderate degree   Thrombocytopenia   Malignant pleural effusion   Palliative care encounter   DNR (do not resuscitate) discussion   Septic shock    Time spent: 30 min    Abdulkarim Eberlin, [facility] Pager [phone]. If 7PM-7AM, please contact night-coverage at [email or web address], password [facility] August 31, 2014, 8:29 AM  LOS: 5 days

## 2014-08-29 NOTE — Progress Notes (Signed)
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 He was able to do some of the financial tasks that need to be done last night.  He is on a high dose of Levophed. His blood pressure still is with a systolic of 90. His creatinine is 1.11.  His hemoglobin is actually better. It is 10.5. Platelet count is 29. I suppose we try some Amicar to try to help with platelet function.  He appears to be comfortable.  He is able to write a couple things out for me. As always, he was quite gracious and thanked [place] for what we are trying to do for him.  His son was with [place]. His son sees be doing a little better with the situation.  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 I was not sure his blood pressure being this low will be able to sustain his organ function.  He is getting some enteral feeding.  His urine output is not that great I'm sure, because of the vasoconstriction secondary to the Levophed.  He does have a little bit of a temperature. This is no surprise from my point of view.  Again, we are trying to focus on his comfort. I again reaffirmed that there is no role for chemotherapy. Dr. [name] is alert enough to understand this.  It is apparent that the staff in the ICU is doing a fantastic job and make sure that his and his family's needs are met as much as possible given this very difficult situation.  I don't think that a platelet transfusion would really help. 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  [name] [name]  1 [name] [name] 2:17

## 2014-08-29 NOTE — Progress Notes (Signed)
Pt has expired, RT extubated per son's wishes.

## 2014-08-29 DEATH — deceased

## 2014-12-16 DIAGNOSIS — R112 Nausea with vomiting, unspecified: Secondary | ICD-10-CM | POA: Insufficient documentation

## 2014-12-16 DIAGNOSIS — K5900 Constipation, unspecified: Secondary | ICD-10-CM | POA: Insufficient documentation

## 2014-12-16 DIAGNOSIS — E0590 Thyrotoxicosis, unspecified without thyrotoxic crisis or storm: Secondary | ICD-10-CM | POA: Insufficient documentation

## 2015-08-19 ENCOUNTER — Other Ambulatory Visit: Payer: Self-pay | Admitting: Nurse Practitioner

## 2016-05-16 IMAGING — MR MR HEAD WO/W CM
9 of 13 series · 33 of 48 positions shown · IV contrast (multihance)
Comparison: Neck CT 07/03/2014.

CLINICAL DATA: 73-year-old male with metastatic disease detected on
recent neck, chest CT and lumbar MRI. Thyroid metastatic disease
versus primary malignancy. Biopsy pending. Staging. Subsequent
encounter.

EXAM:
MRI HEAD WITHOUT AND WITH CONTRAST
TECHNIQUE: Multiplanar, multiecho pulse sequences of the brain and surrounding
structures were obtained without and with intravenous contrast.
CONTRAST:  12mL MULTIHANCE GADOBENATE DIMEGLUMINE 529 MG/ML IV SOLN

[Series 4: DWI · axial · 3.0mm · 1.09mm/px · z∈[-53,+105]mm · 9 of 108 slices shown (1 of 4)]
[im 1/108]
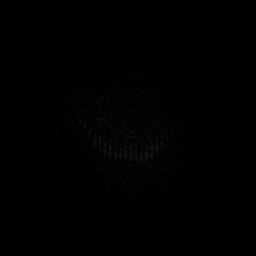
[im 14/108]
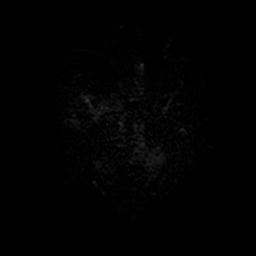
[im 27/108]
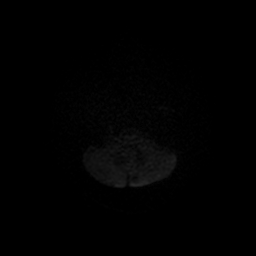
[im 41/108]
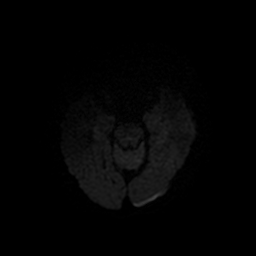
[im 54/108]
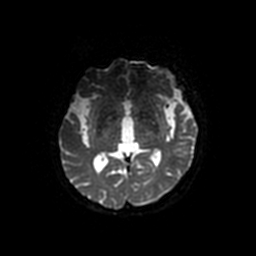
[im 67/108]
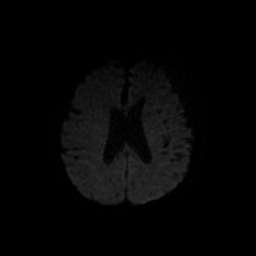
[im 81/108]
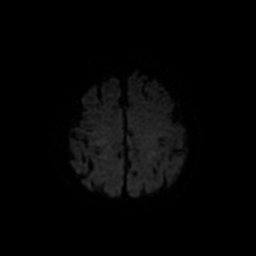
[im 94/108]
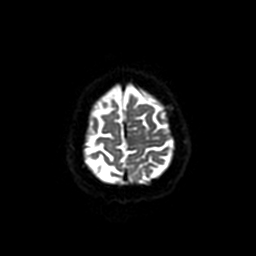
[im 108/108]
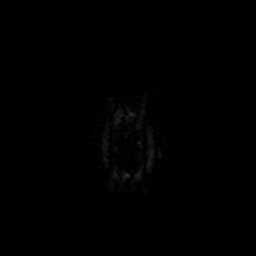

[Series 5: DWI · coronal · 5.0mm · 1.09mm/px · 6 of 68 slices shown (2 of 4)]
[im 1/68]
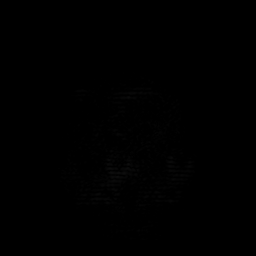
[im 14/68]
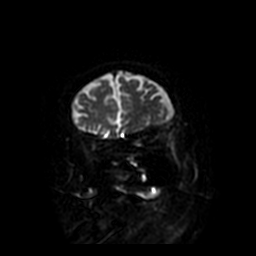
[im 27/68]
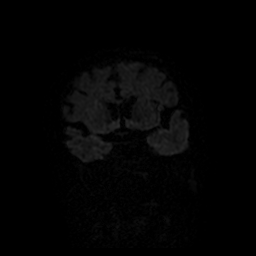
[im 41/68]
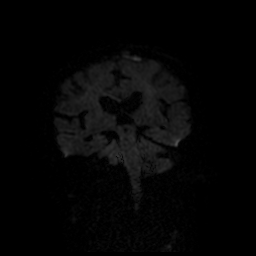
[im 54/68]
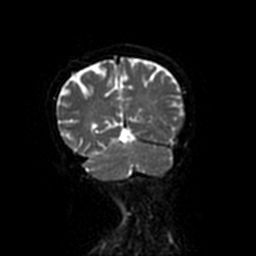
[im 68/68]
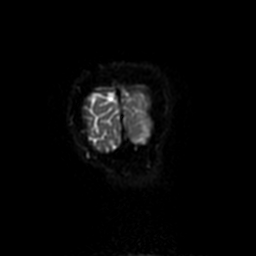

[Series 6: T2 · axial · 5.0mm · 0.43mm/px · z∈[-57,+110]mm · 2 of 27 slices shown]
[im 1/27]
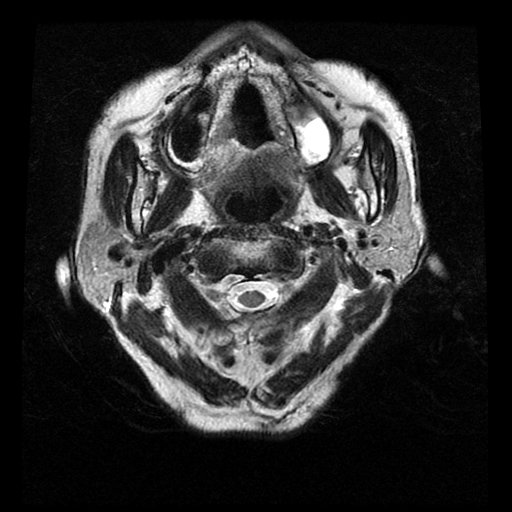
[im 27/27]
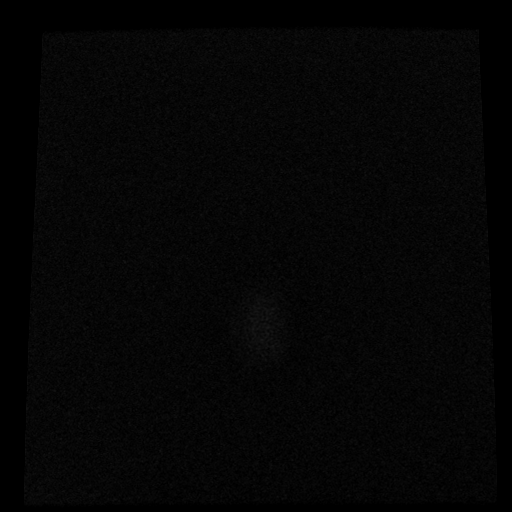

[Series 7: FLAIR · axial · 5.0mm · 0.43mm/px · z∈[-57,+110]mm · 2 of 27 slices shown]
[im 1/27]
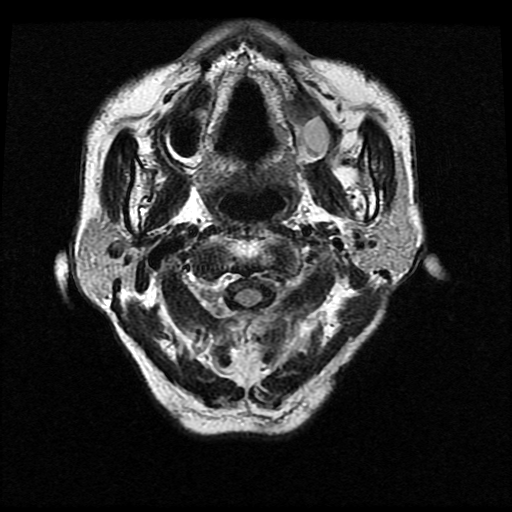
[im 27/27]
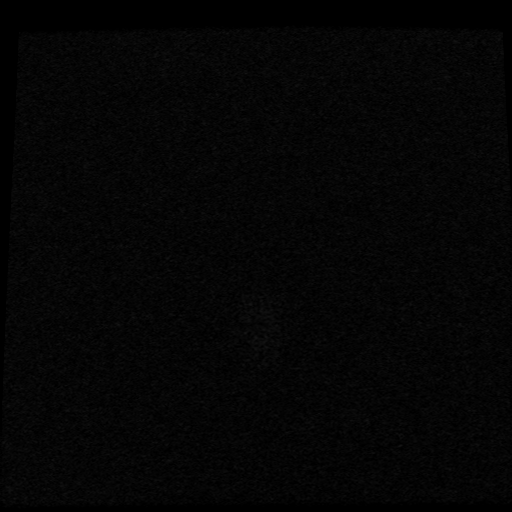

[Series 10: T2 post-contrast · coronal · 5.0mm · 0.45mm/px · 2 of 27 slices shown]
[im 1/27]
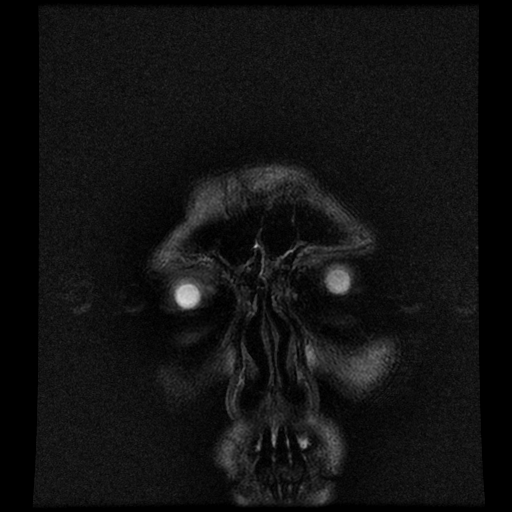
[im 27/27]
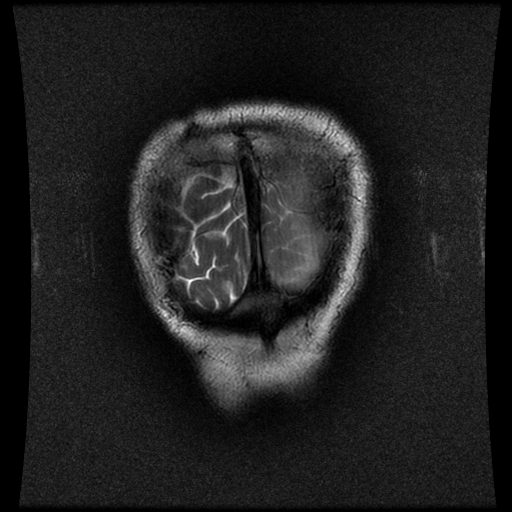

[Series 12: T1 post-contrast · coronal · 5.0mm · 0.45mm/px · 2 of 27 slices shown (1 of 2)]
[im 1/27]
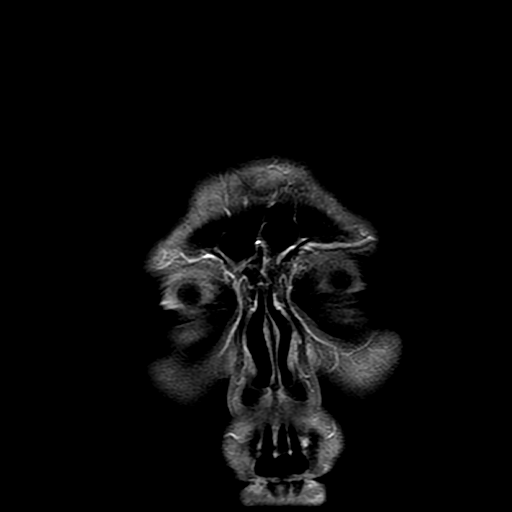
[im 27/27]
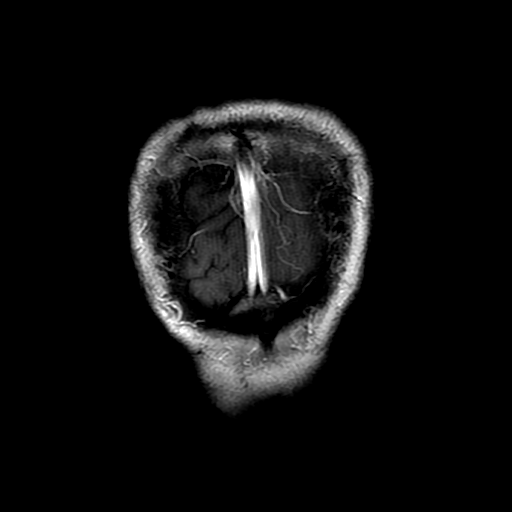

[Series 13: T1 post-contrast · sagittal · 5.0mm · 0.47mm/px · 2 of 24 slices shown (2 of 2)]
[im 1/24]
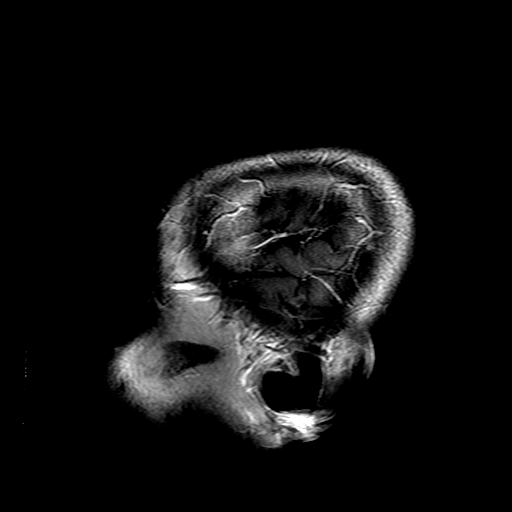
[im 24/24]
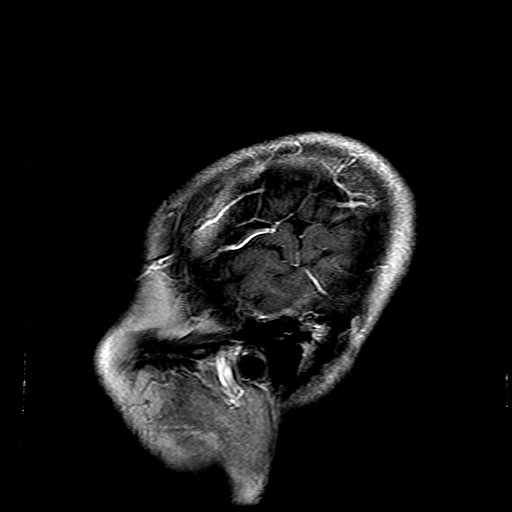

[Series 400: DWI · axial · 3.0mm · 1.09mm/px · z∈[-53,+105]mm · 5 of 54 slices shown (3 of 4)]
[im 1/54]
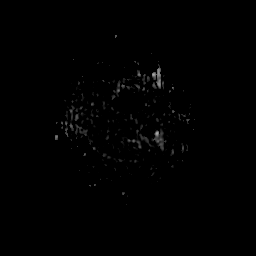
[im 14/54]
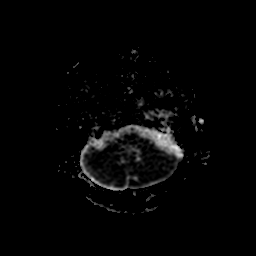
[im 27/54]
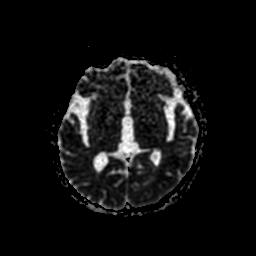
[im 40/54]
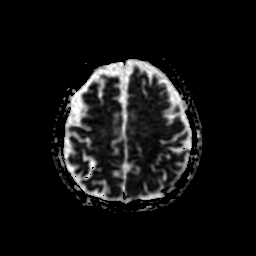
[im 54/54]
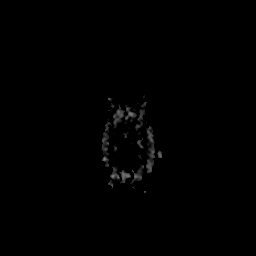

[Series 500: DWI · coronal · 5.0mm · 1.09mm/px · 3 of 34 slices shown (4 of 4)]
[im 1/34]
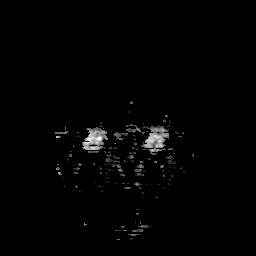
[im 17/34]
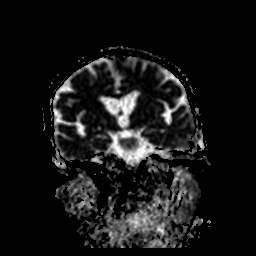
[im 34/34]
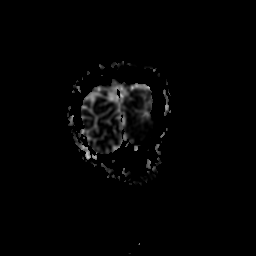

[33 of 48 positions shown; findings below may reference images not displayed]

FINDINGS: Overlying the left superior frontal gyrus there is a 30 x 37 x 8 mm
area of abnormal signal and enhancement involving the dura; Netrananda
and leptomeninges, and also the calvarium. There is associated
abnormal FLAIR hyperintensity in the subarachnoid spaces which is
felt related to leptomeningeal abnormality. See series 7, image 23,
post-contrast series 11, image 52 series 13, image 14 and series 12,
image 12. Still, there is no associated cortical or cerebral edema,
in the adjacent bone marrow signal appears normal, without an
infiltrative osseous lesion in this area. There are some chronic
leptomeningeal or dural blood products (series 8, images 23 and 24).

There is a superimposed left posterior fossa subdural hematoma
measuring up to 11 mm in thickness which appears to be new since
07/03/2014. There is a hematocrit level associated. See series 6,
image 7 and series 7, image 7. The hematoma wraps around the left
cerebellar hemisphere, but there is no posterior fossa midline shift
or basilar cistern effacement. There is also a left supratentorial
subdural hematoma measuring up to 5-6 mm in thickness series 7,
image 15 and series 10, image 11). Associated susceptibility
artifact including on trace diffusion imaging.

Trace supratentorial rightward midline shift. No ventricular
effacement. No other significant intracranial mass effect.

There is smooth thickening and hyperenhancement of the left
tentorium (series 12, image 10) without associated leptomeningeal
enhancement, likely reactive due to the subdural blood.

No other abnormal intracranial enhancement identified. No
parenchymal tumor or metastasis identified.

Grossly negative visualized cervical spine and cervical spinal cord.

No restricted diffusion or evidence of acute infarction. No
intraventricular hemorrhage or ventriculomegaly. Major intracranial
vascular flow voids are preserved, including the superior sagittal
sinus which is marginally affected by the left superior frontal
lesion described in the first paragrah. Normal for age gray and
white matter signal. Negative pituitary and cervicomedullary
junction.

Visible internal auditory structures appear normal. Mastoids are
clear. Trace paranasal sinus mucosal thickening. Visualized orbit
soft tissues are within normal limits. Visualized scalp soft tissues
are within normal limits. Bone marrow signal aside from that
described at the left vertex is within normal limits.
IMPRESSION: 1. Left side subdural hematomas in both the posterior fossa and
along the left hemisphere, appear to be new since the neck CT on
07/03/2014.
[DATE]. Abnormal appearance of a 3-4 cm area of the Netrananda and
leptomeninges at the left vertex over the left superior frontal
gyrus. Given the possibility of metastatic thyroid carcinoma in this
patient, this constellation may reflect a dural metastasis from
thyroid cancer, and thyroid metastases in the brain are prone to
hemorrhage - which might explain #1. In the absence of a known
malignancy, the differential diagnosis of this dural abnormality
would include a dural vascular malformation.
3. No cerebral or cerebellar edema. No significant intracranial mass
effect at this time.
4. No other putative metastatic disease identified about the brain;
smooth left tentorial dural thickening is likely reactive due to the
hemorrhage.
Study discussed by telephone with Dr. Czechu Irena on 07/06/2014 at
3116 hours.

## 2016-05-17 IMAGING — DX DG CHEST 1V PORT
2 series · 2 of 2 positions shown · non-contrast
Comparison: 07/06/2014

CLINICAL DATA: Aspiration.  Hypoxia.  Evaluate for pneumonia.

EXAM:
PORTABLE CHEST - 1 VIEW

[chest ap (1 of 2)]
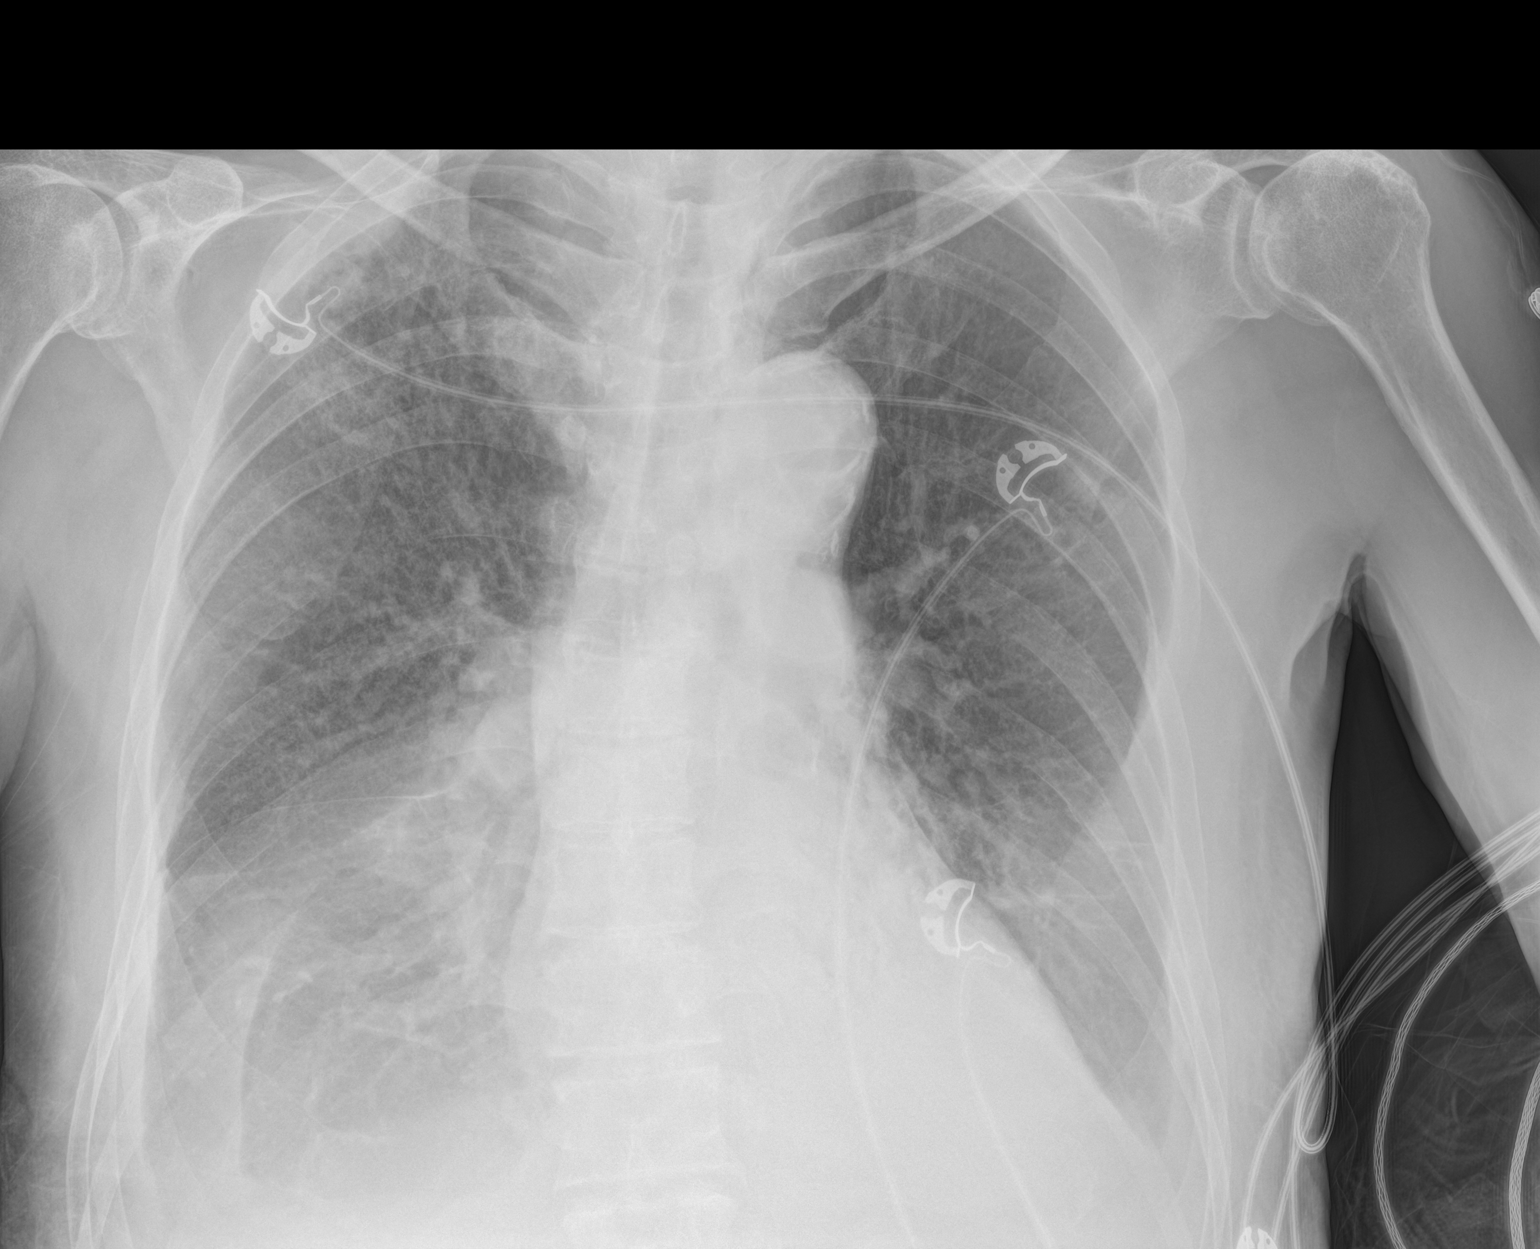

[chest ap (2 of 2)]
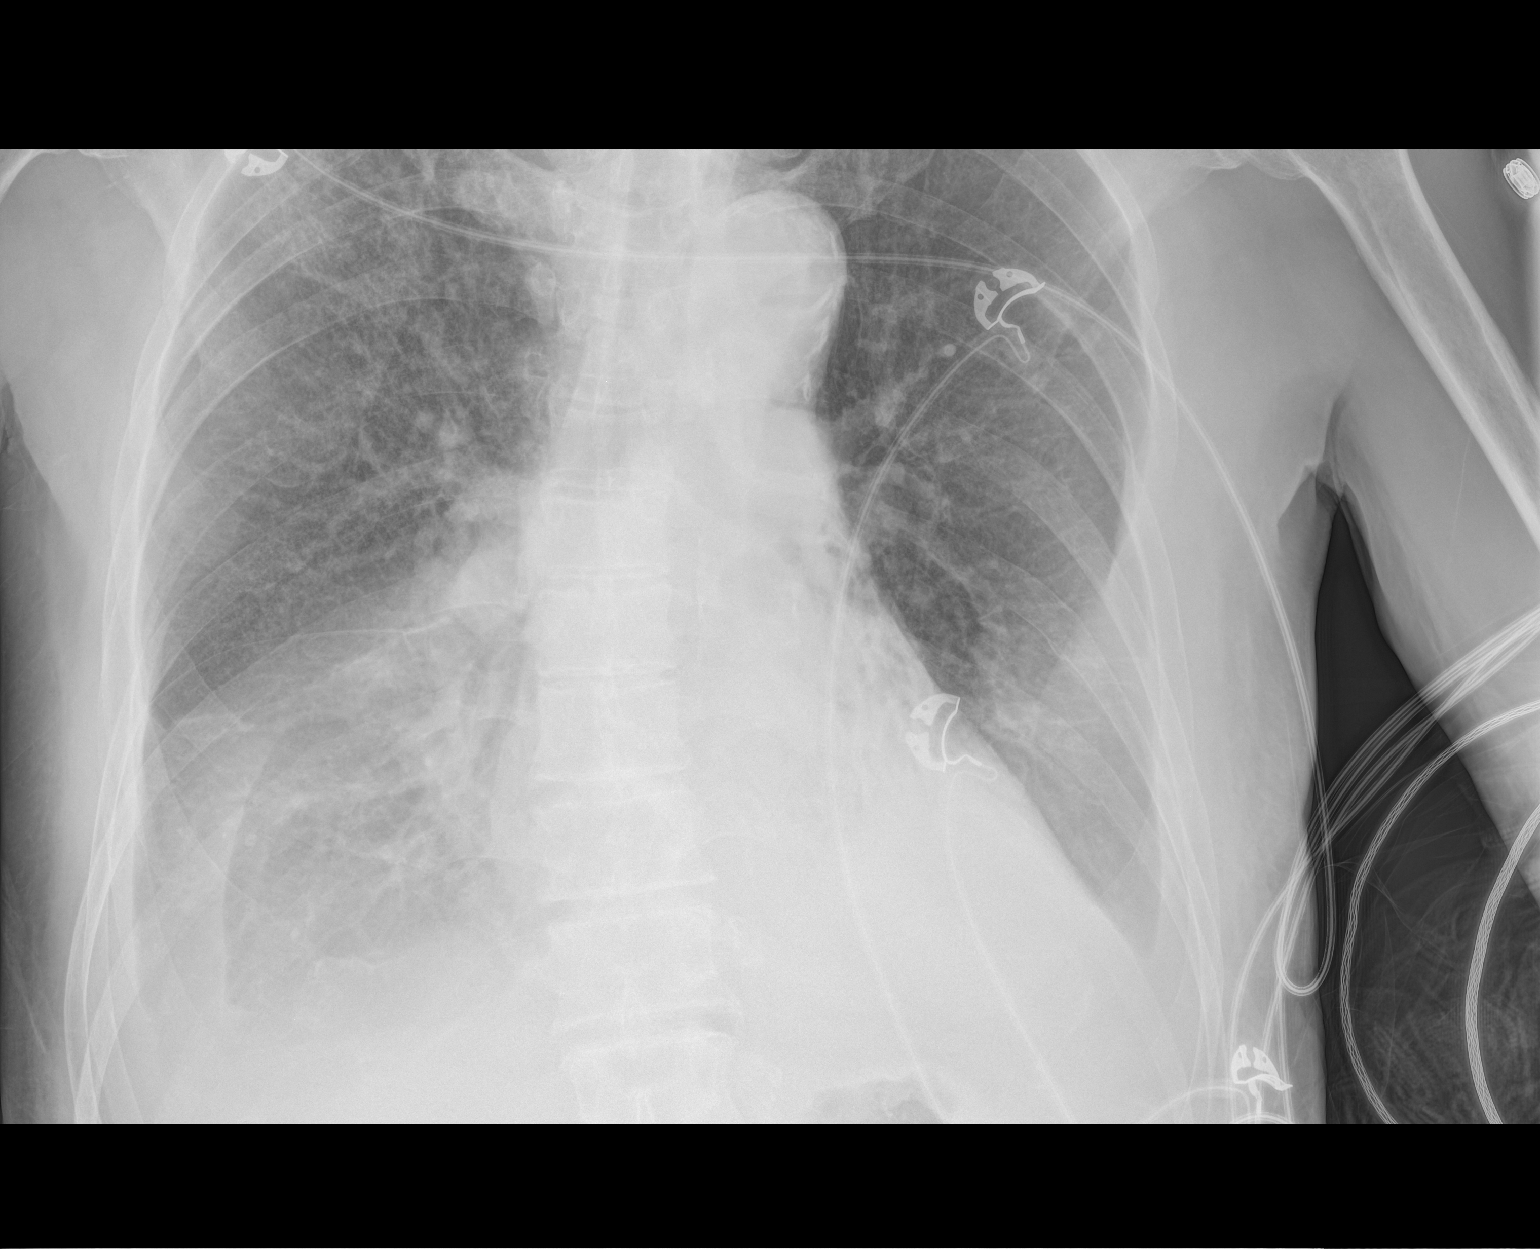

[2 of 2 positions shown; findings below may reference images not displayed]

FINDINGS: Normal heart size. There is calcified atherosclerosis involving the
thoracic aorta. Bilateral pleural effusions are identified. There is
mild diffuse interstitial edema. Airspace consolidation with air
bronchograms are noted in the left lower lobe.
IMPRESSION: 1. Left lower lobe airspace consolidation.
2. Bilateral pleural effusions and interstitial edema consistent
with pneumonia.

## 2016-05-17 IMAGING — US US BIOPSY
1 series · 13 of 19 positions shown · non-contrast
Comparison: none

CLINICAL DATA: 73-year-old male with a history of lymphadenopathy,
concern for thyroid cancer metastasis

[Series 1: us biopsy · 0.05mm/px · 13 of 19 slices shown]
[im 1/19]
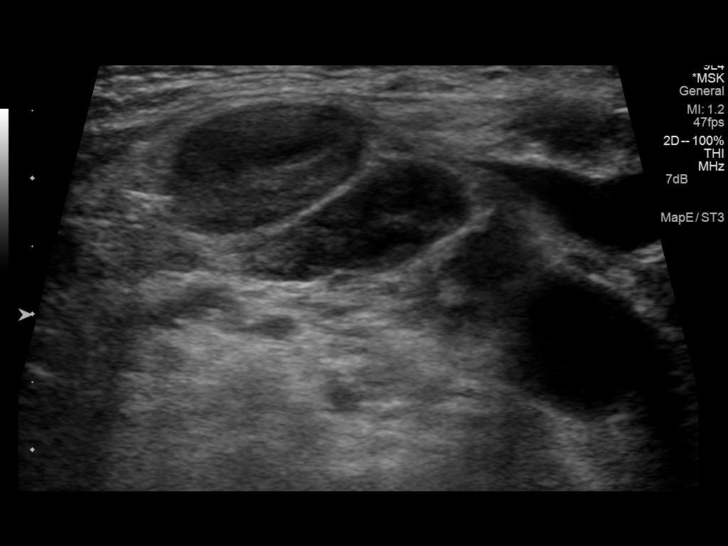
[im 3/19]
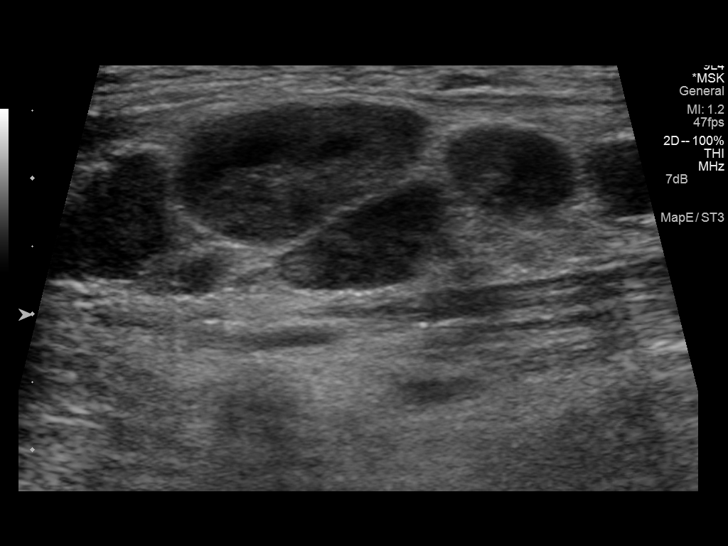
[im 4/19]
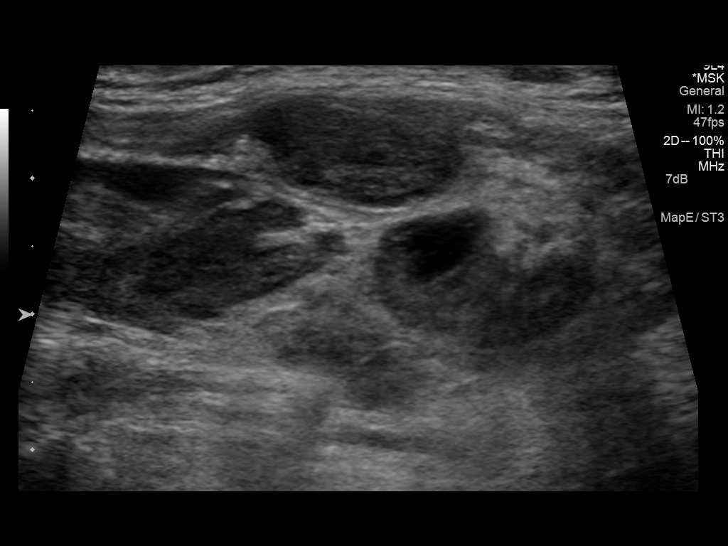
[im 6/19]
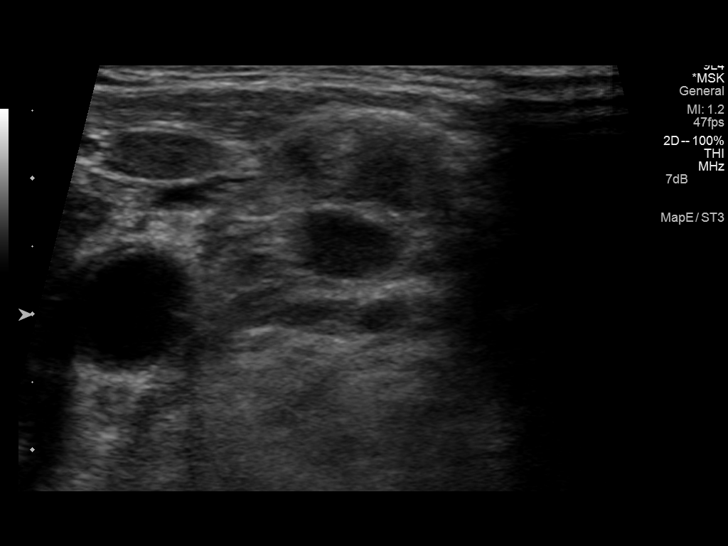
[im 7/19]
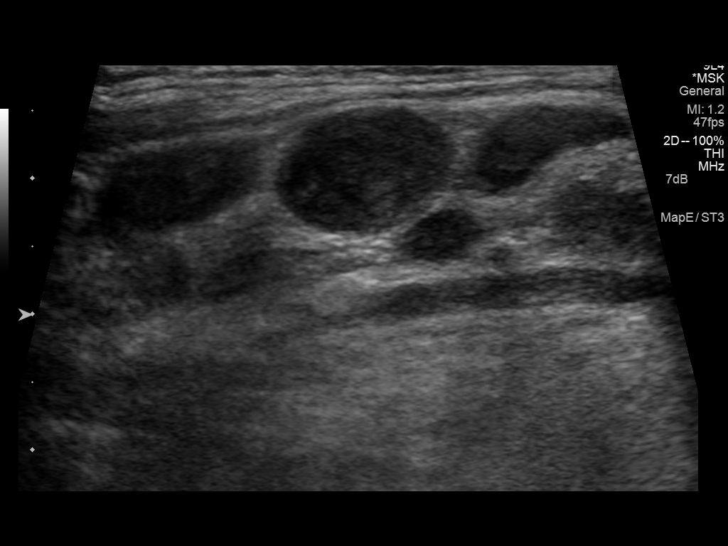
[im 9/19]
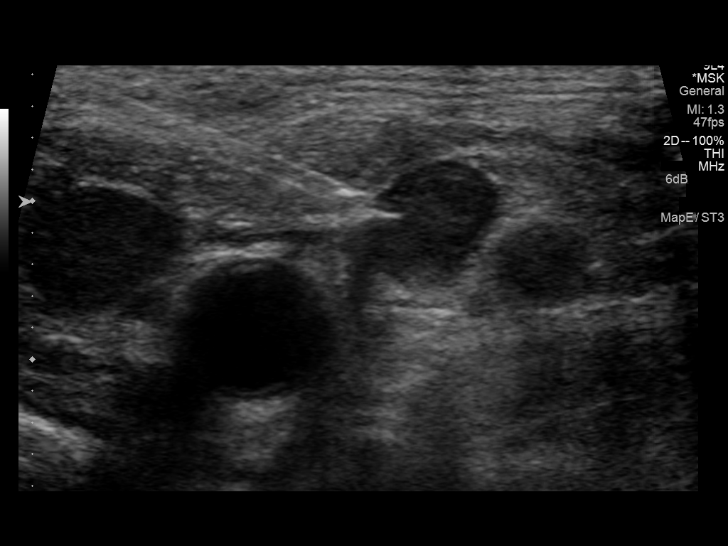
[im 10/19]
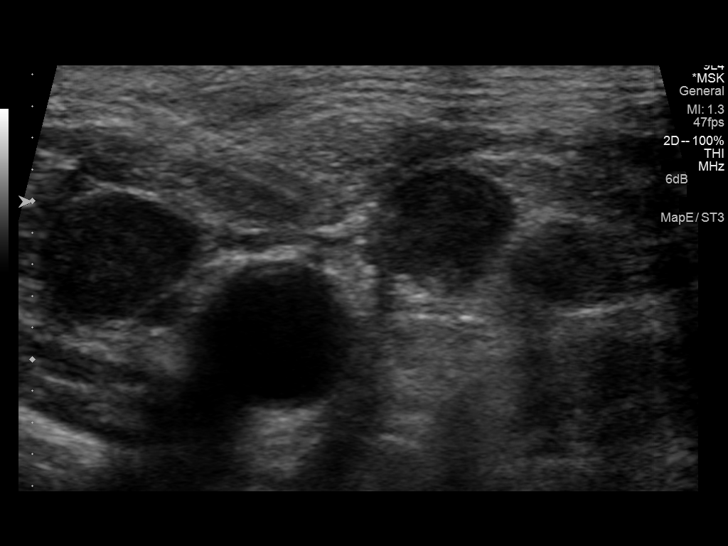
[im 11/19]
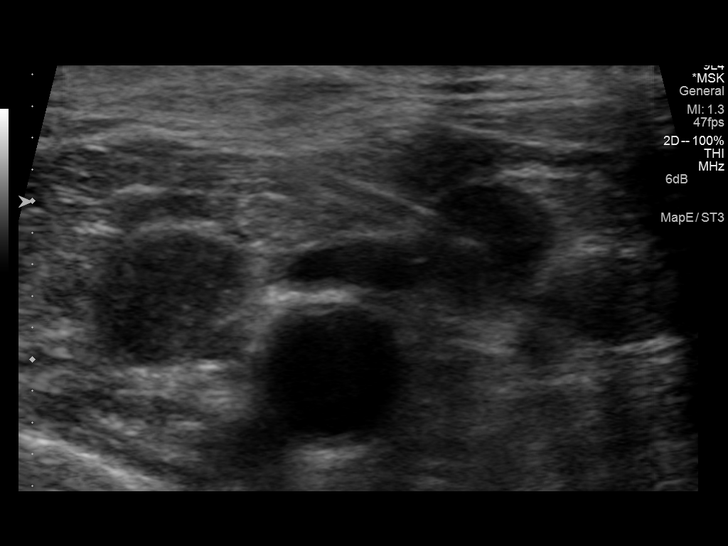
[im 13/19]
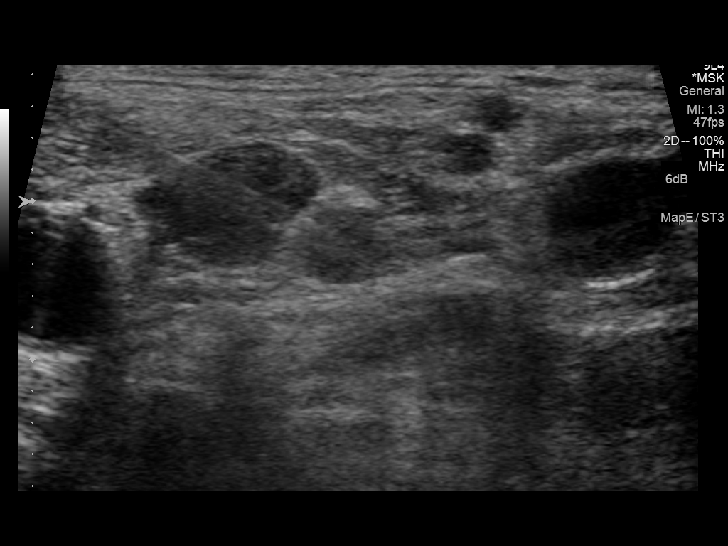
[im 14/19]
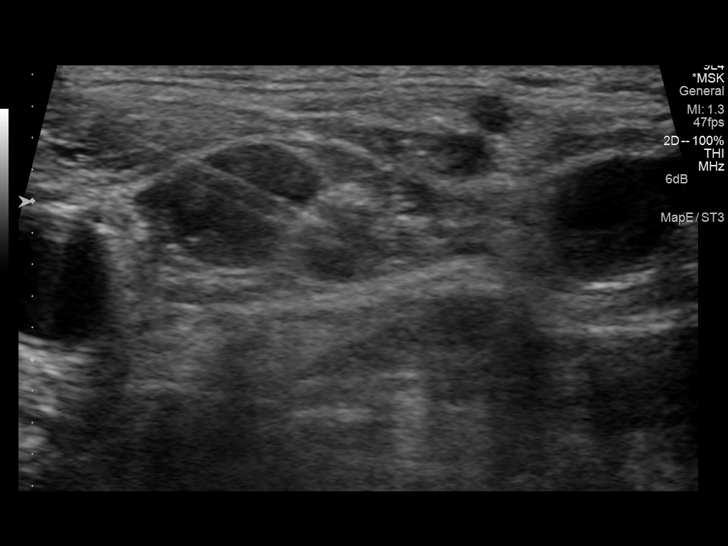
[im 16/19]
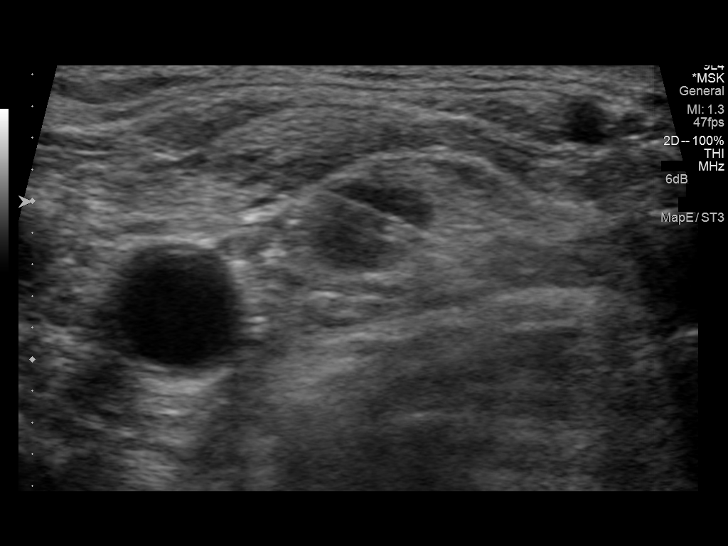
[im 17/19]
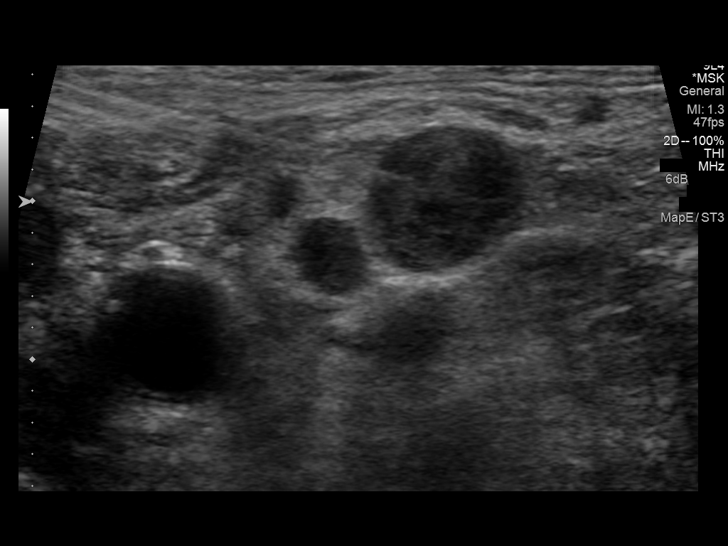
[im 19/19]
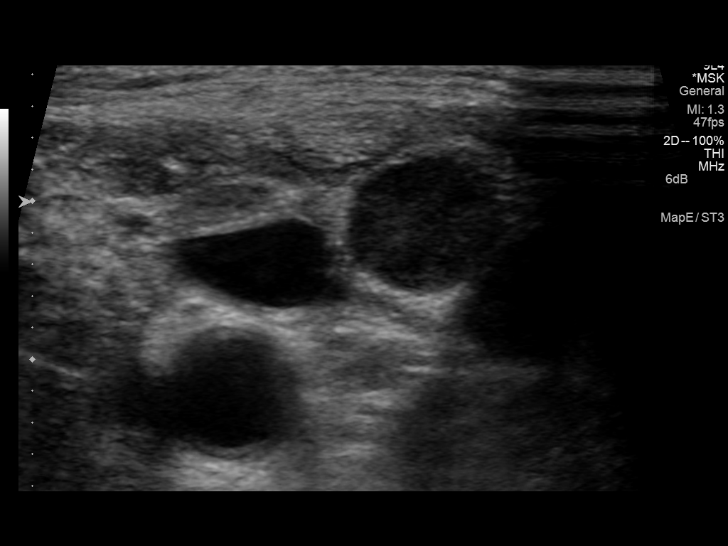

[13 of 19 positions shown; findings below may reference images not displayed]

EXAM:
ULTRASOUND GUIDED CORE BIOPSY OF LEFT CERVICAL LYMPH NODES

MEDICATIONS:
1.0 mg IV Versed; 0 mcg IV Fentanyl

Total Moderate Sedation Time: 0

PROCEDURE:
The procedure, risks, benefits, and alternatives were explained to
the patient. Questions regarding the procedure were encouraged and
answered. The patient understands and consents to the procedure.

Ultrasound survey of the left neck was performed with images stored
and sent to PACs.

The left neck was prepped with Betadine in a sterile fashion, and a
sterile drape was applied covering the operative field. A sterile
gown and sterile gloves were used for the procedure. Local
anesthesia was provided with 1% Lidocaine.

Once the patient was prepped and draped sterilely, the skin and
subcutaneous tissues were generously infiltrated with 1% lidocaine
without epinephrine. Ultrasound guidance was used to infiltrate the
skin and subcutaneous tissues to the level of the targeted nodes.

A small skin incision was made with 11 blade scalpel, and then and
18 gauge core biopsy gun was used to retrieve 5 separate 18 gauge
core biopsy. Each of these was observed under ultrasound.

Tissue specimen sent to pathology in saline.

The patient tolerated the procedure well and remained
hemodynamically stable throughout.

No complications were encountered and no significant blood loss was
encountered.

Sterile bandage was placed.

EBL = 0

COMPLICATIONS:
None.
FINDINGS: Ultrasound survey demonstrates multiple abnormal nodes along the
left cervical chain.

Images during the case demonstrate needle placement within the nodes
on each pass.

Final image demonstrates no complicating features.
IMPRESSION: Status post ultrasound-guided biopsy of left cervical lymph nodes.
Specimen sent to pathology for complete histopathologic analysis.
